# Patient Record
Sex: Female | Born: 1945 | Race: White | Hispanic: No | Marital: Married | Smoking: Former smoker
Health system: Southern US, Community
[De-identification: ages and names within clinical notes are randomized; demographics above are authoritative.]

## PROBLEM LIST (undated history)

## (undated) DIAGNOSIS — D497 Neoplasm of unspecified behavior of endocrine glands and other parts of nervous system: Secondary | ICD-10-CM

## (undated) DIAGNOSIS — J189 Pneumonia, unspecified organism: Secondary | ICD-10-CM

## (undated) DIAGNOSIS — I82409 Acute embolism and thrombosis of unspecified deep veins of unspecified lower extremity: Secondary | ICD-10-CM

## (undated) DIAGNOSIS — R0600 Dyspnea, unspecified: Secondary | ICD-10-CM

## (undated) DIAGNOSIS — M1990 Unspecified osteoarthritis, unspecified site: Secondary | ICD-10-CM

## (undated) DIAGNOSIS — Z9289 Personal history of other medical treatment: Secondary | ICD-10-CM

## (undated) DIAGNOSIS — R0609 Other forms of dyspnea: Secondary | ICD-10-CM

## (undated) DIAGNOSIS — I5189 Other ill-defined heart diseases: Secondary | ICD-10-CM

## (undated) DIAGNOSIS — M545 Low back pain, unspecified: Secondary | ICD-10-CM

## (undated) DIAGNOSIS — G43909 Migraine, unspecified, not intractable, without status migrainosus: Secondary | ICD-10-CM

## (undated) DIAGNOSIS — F419 Anxiety disorder, unspecified: Secondary | ICD-10-CM

## (undated) DIAGNOSIS — Z8489 Family history of other specified conditions: Secondary | ICD-10-CM

## (undated) DIAGNOSIS — G8929 Other chronic pain: Secondary | ICD-10-CM

## (undated) DIAGNOSIS — E039 Hypothyroidism, unspecified: Secondary | ICD-10-CM

## (undated) DIAGNOSIS — I10 Essential (primary) hypertension: Secondary | ICD-10-CM

## (undated) HISTORY — DX: Other forms of dyspnea: R06.09

## (undated) HISTORY — PX: BACK SURGERY: SHX140

## (undated) HISTORY — PX: CARPAL TUNNEL RELEASE: SHX101

## (undated) HISTORY — PX: REFRACTIVE SURGERY: SHX103

## (undated) HISTORY — PX: POSTERIOR CERVICAL FUSION/FORAMINOTOMY: SHX5038

## (undated) HISTORY — PX: HEMORRHOID SURGERY: SHX153

## (undated) HISTORY — PX: DILATION AND CURETTAGE OF UTERUS: SHX78

## (undated) HISTORY — PX: VAGINAL HYSTERECTOMY: SUR661

## (undated) HISTORY — DX: Dyspnea, unspecified: R06.00

## (undated) HISTORY — DX: Other ill-defined heart diseases: I51.89

## (undated) HISTORY — PX: TOTAL THYROIDECTOMY: SHX2547

## (undated) HISTORY — PX: BILATERAL SALPINGOOPHORECTOMY: SHX1223

## (undated) HISTORY — PX: LUMBAR DISC SURGERY: SHX700

---
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
## 1997-09-05 ENCOUNTER — Emergency Department (HOSPITAL_COMMUNITY): Admission: EM | Admit: 1997-09-05 | Discharge: 1997-09-05 | Payer: Self-pay | Admitting: Internal Medicine

## 1998-04-06 ENCOUNTER — Other Ambulatory Visit: Admission: RE | Admit: 1998-04-06 | Discharge: 1998-04-06 | Payer: Self-pay | Admitting: Obstetrics & Gynecology

## 1999-06-23 ENCOUNTER — Encounter: Payer: Self-pay | Admitting: Endocrinology

## 1999-06-23 ENCOUNTER — Ambulatory Visit (HOSPITAL_COMMUNITY): Admission: RE | Admit: 1999-06-23 | Discharge: 1999-06-23 | Payer: Self-pay | Admitting: Endocrinology

## 1999-07-19 ENCOUNTER — Other Ambulatory Visit: Admission: RE | Admit: 1999-07-19 | Discharge: 1999-07-19 | Payer: Self-pay | Admitting: Obstetrics and Gynecology

## 2000-08-16 ENCOUNTER — Encounter: Admission: RE | Admit: 2000-08-16 | Discharge: 2000-08-16 | Payer: Self-pay

## 2000-11-15 ENCOUNTER — Encounter: Admission: RE | Admit: 2000-11-15 | Discharge: 2000-12-28 | Payer: Self-pay | Admitting: Neurology

## 2001-01-22 ENCOUNTER — Encounter: Payer: Self-pay | Admitting: Neurosurgery

## 2001-01-22 ENCOUNTER — Inpatient Hospital Stay (HOSPITAL_COMMUNITY): Admission: RE | Admit: 2001-01-22 | Discharge: 2001-01-23 | Payer: Self-pay | Admitting: Neurosurgery

## 2001-02-13 ENCOUNTER — Encounter: Payer: Self-pay | Admitting: Neurosurgery

## 2001-02-13 ENCOUNTER — Encounter: Admission: RE | Admit: 2001-02-13 | Discharge: 2001-02-13 | Payer: Self-pay | Admitting: Neurosurgery

## 2001-04-03 ENCOUNTER — Encounter: Admission: RE | Admit: 2001-04-03 | Discharge: 2001-04-03 | Payer: Self-pay | Admitting: Neurosurgery

## 2001-04-03 ENCOUNTER — Encounter: Payer: Self-pay | Admitting: Neurosurgery

## 2002-01-29 ENCOUNTER — Other Ambulatory Visit: Admission: RE | Admit: 2002-01-29 | Discharge: 2002-01-29 | Payer: Self-pay | Admitting: Obstetrics and Gynecology

## 2007-01-11 ENCOUNTER — Ambulatory Visit: Payer: Self-pay | Admitting: Gastroenterology

## 2007-07-30 ENCOUNTER — Encounter (INDEPENDENT_AMBULATORY_CARE_PROVIDER_SITE_OTHER): Payer: Self-pay | Admitting: General Surgery

## 2007-07-30 ENCOUNTER — Ambulatory Visit (HOSPITAL_COMMUNITY): Admission: RE | Admit: 2007-07-30 | Discharge: 2007-07-30 | Payer: Self-pay | Admitting: General Surgery

## 2008-03-24 ENCOUNTER — Ambulatory Visit (HOSPITAL_COMMUNITY): Admission: RE | Admit: 2008-03-24 | Discharge: 2008-03-24 | Payer: Self-pay | Admitting: Obstetrics and Gynecology

## 2008-05-16 ENCOUNTER — Encounter: Admission: RE | Admit: 2008-05-16 | Discharge: 2008-05-16 | Payer: Self-pay | Admitting: Endocrinology

## 2010-04-05 ENCOUNTER — Encounter: Admission: RE | Admit: 2010-04-05 | Discharge: 2010-04-05 | Payer: Self-pay | Admitting: Neurosurgery

## 2010-07-14 ENCOUNTER — Encounter: Admission: RE | Admit: 2010-07-14 | Discharge: 2010-07-14 | Payer: Self-pay | Admitting: Endocrinology

## 2010-07-14 ENCOUNTER — Encounter: Admission: RE | Admit: 2010-07-14 | Discharge: 2010-07-14 | Payer: Self-pay | Admitting: Neurosurgery
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
## 2011-01-07 NOTE — H&P (Signed)
[place]  Patient:    Gina Lyons, Gina Lyons                        MRN: [id number] Adm. Date:  01/22/01 Attending:  Hewitt Shorts, M.D. CC:         Bernadene Person, M.D.   History and Physical  HISTORY OF PRESENT ILLNESS:  Patient is a 65 year old right-handed white female who was evaluated for a right cervical radiculopathy.  Symptoms began in the fall of 2001.  She developed pain in the right side of her neck that extended down to her right shoulder, arm, forearm and hand, with the worst of her pain being in the forearm and arm.  She was initially treated for a tendinitis.  She was eventually studied with x-rays and MRI scan.  She describes numbness and tingling in the third, fourth and fifth digits of her right hand and a sense of weakness in the right upper extremity.  She was treated with a variety of NSAIDs without relief; she has been treated with Flexeril as well.  She underwent physical therapy for three weeks which aggravated her pain.  X-rays and MRI scan showed degenerative disk disease and spondylosis at C5-6 with minimal degenerative changes at the other levels.  There is bilateral foraminal encroachment of C5-6, right worse than left.  PAST MEDICAL HISTORY:  Notable for a benign thyroid tumor resected in 1997. She is on Synthroid.  She recently has been started on treatment for hypertension and has been undergoing evaluation for this; there is no history though of myocardial infarction, cancer, stroke, diabetes, peptic ulcer disease or lung disease.  PAST SURGICAL HISTORY:  Previous surgery includes her thyroid surgery in September of 1997 and a hysterectomy in 1970.  ALLERGIES:  She reports allergies to LORCET and CODEINE, both of which cause itching.  CURRENT MEDICATIONS: 1. Synthroid 0.1 mg q.d. 2. Diazepam 10 mg q.p.m. 3. Estradiol 2 mg q.d. 4. Neurontin 300 mg b.i.d. p.r.n. 5. Avalide 12.5 mg recently.  FAMILY  HISTORY:  Her parents have both passed on.  Her mother died at age 43, she had kidney failure.  Her father died at age 58, he had emphysema.  SOCIAL HISTORY:  Patient is married.  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  She does not smoke, drink alcoholic beverages or have a history of substance abuse.  REVIEW OF SYSTEMS:  Review of systems is notable for those difficulties described in her history of present illness and past medical history but is otherwise unremarkable.  PHYSICAL EXAMINATION:  GENERAL:  Patient is a well-developed, well-nourished white female in no acute distress.  Her height is 5 feet 3 inches.  Weight 123 pounds.  VITAL SIGNS:  Temperature 97, pulse 82, blood pressure 121/72, respiratory rate 16.  LUNGS:  Clear to auscultation.  She has symmetrical respiratory excursion.  HEART:  Regular rate and rhythm.  Normal S1 and S2.  There is no murmur.  ABDOMEN:  Soft, nondistended.  Bowel sounds are present.  EXTREMITIES:  Examination shows no clubbing, cyanosis, or edema.  MUSCULOSKELETAL:  No tenderness to palpation over the cervical spinous processes.  There is some tenderness on palpation of the right paracervical musculature, none in the left paracervical musculature.  She has a full range of motion of the neck but discomfort typically with extension of the neck as well as with lateral flexion to either side.  NEUROLOGIC:  Examination shows  5/5 strength to the upper extremities including deltoids, biceps, triceps, extensors and grip.  She has discomfort with use of the right upper extremity and therefore it is difficult for her to exert full effort.  Sensation is somewhat decreased to pinprick in the third, fourth and fifth digits of her right hand, although she does sense pinprick there. Reflexes:  The biceps and brachioradialis are 1 to 2 bilaterally.  Triceps are minimally bilaterally.  Quadriceps are minimal bilaterally.  The gastrocnemii are  minimal bilaterally.  Toes are downgoing bilaterally.  She has a normal gait and stance.  IMPRESSION:  Right cervical radiculopathy secondary to advanced degenerative disk disease and spondylosis at C5-6 with resulting neck pain and radiculopathy.  PLAN:  The patient will be admitted for a single-level C5-6 anterior cervical diskectomy and arthrodesis with allograft and cervical plating.  We have discussed alternatives to surgery, typical length of surgery, hospital stay and overall recuperation, limitations postoperatively and risks of surgery including risks of infection and bleeding with possible need for transfusion, the risk of nerve dysfunction with pain, weakness, numbness or paresthesias, the risk of failure of the arthrodesis and risk of spinal cord dysfunction with paralysis of all four limbs and quadriplegia and anesthetic risks of myocardial infarction, stroke, pneumonia and death.  Understanding all of this, she does wish to proceed with surgery and is admitted for such. DD: 01/22/01 TD:  01/22/01 Job: [id number]

## 2011-01-07 NOTE — Op Note (Signed)
[place]  Patient:    Gina Lyons, Gina Lyons                      MRN: [id number] Proc. Date: 01/22/01 Adm. Date:  [id number] Attending:  Barton Fanny                           Operative Report  PREOPERATIVE DIAGNOSIS:  C5-6 degenerative disk disease, spondylosis, and radiculopathy.  POSTOPERATIVE DIAGNOSIS:  C5-6 degenerative disk disease, spondylosis, and radiculopathy.  PROCEDURE:  C5-6 anterior cervical diskectomy and arthrodesis with iliac crest allograft and tether cervical plating.  SURGEON:  Hewitt Shorts, M.D.  ASSISTANT:  Payton Doughty, M.D.  ANESTHESIA:  General endotracheal.  INDICATIONS:  The patient is a 65 year old woman who presented with a right cervical radiculopathy, who was found to have degenerative disk disease and spondylosis, most significantly effecting the C5-6 level.  It was felt that her radiculopathy was arising from that level.  A decision was made to proceed with a single-level anterior cervical diskectomy and arthrodesis.  DESCRIPTION OF PROCEDURE:  The patient was brought to the operating room and placed under general endotracheal anesthesia.  The patient was placed in 10 pounds of Holter traction, and then the neck was prepped with Betadine soap and solution, and draped in a sterile fashion.  A horizontal incision was made in the left side of the neck.  The line of the incision was infiltrated with local anesthetic with epinephrine.  The incision was made with a soft scalpel at a temperature of 120, and dissection was carried down through the subcutaneous tissue and platysma.  Dissection was then carried out through an avascular plane eliminating the sternocleidomastoid, carotid artery, and jugular vein laterally, and the trachea and esophagus medially.  The ventral aspect of the vertebral column was identified and a localizing x-ray taken. The C5-6 intervertebral disk space was identified.  A  diskectomy was begun with an incision at the annulus and continued with micro-curets and pituitary rongeurs.  The disk space itself was markedly narrowed.  Disk material was spondylitic.  There were ventral osteophytes that were subsequently removed at the time of the arthrodesis.  Dissection was carried down through the disk space, and degenerated disk material was removed.  The microscope was draped and brought into the field to provide sufficient magnification and illumination and visualization.  The remainder of the procedure was performed using microdissection and micro-surgical technique.  Using a Micro-Max drill, spondylitic disk degeneration was removed from the vertebral body end plates, and then spondylitic overgrowth in the posterior aspect of the disk space was removed using the Micro-Max drill and 2 mm Kerrison punches, and then the footplate.  The foraminotomy was performed bilaterally so as to decompress the C6 nerve bilaterally, and then Gelfoam was used to help establish hemostasis. Once hemostasis was established, and a good decompression of the thecal sac and nerve roots was achieved, we proceeded with the arthrodesis.  We selected a wedge of iliac crest allograft.  It was positioned in the intervertebral disk space and countersunk.  The ventral osteophytes were removed, and then we selected a 14 mm tether cervical plate.  It was positioned over the fusion construct, and secured to each of the vertebra with a pair of 4.0 mm x 13.0 mm screws.  Final tightening was done of all four screws, and then an x-ray was taken.  The graft was in  good position.  The plate and screws were in good position.  The overall alignment was good.  The wound was irrigated with bacitracin solution and checked once again.  Hemostasis was established and confirmed, and then we proceeded with the closure of the platysma.  It was closed with interrupted inverted #2-0 undyed Vicryl sutures.  The  subcutaneous and subcuticular were closed with interrupted inverted #3-0 undyed Vicryl sutures.  The skin edges were reapproximated with Dermabond.  The patient tolerated the procedure well.  The estimated blood loss was 75 cc. The sponge all counts were correct.  Following surgery the patient was reversed from the anesthetic, to be extubated, and subsequently transferred to the recovery room for further care.  She is to be placed in a soft cervical collar postoperatively.DD:  01/22/01 TD:  01/22/01 Job: [id number]

## 2011-05-30 LAB — BASIC METABOLIC PANEL
CO2: 28
Creatinine, Ser: 0.58
GFR calc Af Amer: >60
Glucose, Bld: 96
Sodium: 142

## 2011-05-30 LAB — DIFFERENTIAL
Basophils Absolute: 0
Basophils Relative: 0
Eosinophils Relative: 2
Lymphs Abs: 1.5

## 2011-05-30 LAB — CBC
MCV: 95.3
Platelets: 291
RBC: 3.83 — ABNORMAL LOW

## 2011-08-18 ENCOUNTER — Other Ambulatory Visit: Payer: Self-pay | Admitting: Dermatology

## 2011-10-04 ENCOUNTER — Other Ambulatory Visit: Payer: Self-pay | Admitting: Neurosurgery

## 2011-10-04 DIAGNOSIS — M545 Low back pain: Secondary | ICD-10-CM

## 2011-10-08 ENCOUNTER — Ambulatory Visit
Admission: RE | Admit: 2011-10-08 | Discharge: 2011-10-08 | Disposition: A | Discharge status: Home / Self Care | Payer: Medicare Other | Source: Ambulatory Visit | Attending: Neurosurgery | Admitting: Neurosurgery

## 2011-10-08 DIAGNOSIS — M545 Low back pain: Secondary | ICD-10-CM

## 2011-11-30 ENCOUNTER — Other Ambulatory Visit: Payer: Self-pay | Admitting: Dermatology

## 2014-02-06 ENCOUNTER — Other Ambulatory Visit: Payer: Self-pay | Admitting: Gastroenterology

## 2014-02-06 DIAGNOSIS — R197 Diarrhea, unspecified: Secondary | ICD-10-CM

## 2014-02-06 DIAGNOSIS — R1084 Generalized abdominal pain: Secondary | ICD-10-CM

## 2014-02-11 ENCOUNTER — Ambulatory Visit
Admission: RE | Admit: 2014-02-11 | Discharge: 2014-02-11 | Disposition: A | Discharge status: Home / Self Care | Payer: Medicare Other | Source: Ambulatory Visit | Attending: Gastroenterology | Admitting: Gastroenterology

## 2014-02-11 DIAGNOSIS — R1084 Generalized abdominal pain: Secondary | ICD-10-CM

## 2014-02-11 DIAGNOSIS — R197 Diarrhea, unspecified: Secondary | ICD-10-CM

## 2014-02-11 MED ORDER — IOHEXOL 300 MG/ML  SOLN
100.0000 mL | Freq: Once | INTRAMUSCULAR | Status: AC | PRN
  Administered 2014-02-11: 100 mL via INTRAVENOUS

## 2015-03-03 DIAGNOSIS — E039 Hypothyroidism, unspecified: Secondary | ICD-10-CM | POA: Diagnosis not present

## 2015-03-03 DIAGNOSIS — I10 Essential (primary) hypertension: Secondary | ICD-10-CM | POA: Diagnosis not present

## 2015-03-25 DIAGNOSIS — D225 Melanocytic nevi of trunk: Secondary | ICD-10-CM | POA: Diagnosis not present

## 2015-03-25 DIAGNOSIS — L570 Actinic keratosis: Secondary | ICD-10-CM | POA: Diagnosis not present

## 2015-03-25 DIAGNOSIS — L821 Other seborrheic keratosis: Secondary | ICD-10-CM | POA: Diagnosis not present

## 2015-03-25 DIAGNOSIS — L820 Inflamed seborrheic keratosis: Secondary | ICD-10-CM | POA: Diagnosis not present

## 2015-03-25 DIAGNOSIS — X32XXXD Exposure to sunlight, subsequent encounter: Secondary | ICD-10-CM | POA: Diagnosis not present

## 2015-04-29 DIAGNOSIS — Z01411 Encounter for gynecological examination (general) (routine) with abnormal findings: Secondary | ICD-10-CM | POA: Diagnosis not present

## 2015-04-29 DIAGNOSIS — L750 Bromhidrosis: Secondary | ICD-10-CM | POA: Diagnosis not present

## 2015-04-29 DIAGNOSIS — R8290 Unspecified abnormal findings in urine: Secondary | ICD-10-CM | POA: Diagnosis not present

## 2015-04-29 DIAGNOSIS — N959 Unspecified menopausal and perimenopausal disorder: Secondary | ICD-10-CM | POA: Diagnosis not present

## 2015-05-21 DIAGNOSIS — R109 Unspecified abdominal pain: Secondary | ICD-10-CM | POA: Diagnosis not present

## 2015-05-21 DIAGNOSIS — E032 Hypothyroidism due to medicaments and other exogenous substances: Secondary | ICD-10-CM | POA: Diagnosis not present

## 2015-05-21 DIAGNOSIS — M549 Dorsalgia, unspecified: Secondary | ICD-10-CM | POA: Diagnosis not present

## 2015-05-21 DIAGNOSIS — I10 Essential (primary) hypertension: Secondary | ICD-10-CM | POA: Diagnosis not present

## 2015-05-21 DIAGNOSIS — Z1231 Encounter for screening mammogram for malignant neoplasm of breast: Secondary | ICD-10-CM | POA: Diagnosis not present

## 2015-06-10 ENCOUNTER — Other Ambulatory Visit (HOSPITAL_COMMUNITY): Payer: Self-pay

## 2015-06-10 ENCOUNTER — Ambulatory Visit (HOSPITAL_COMMUNITY)
Admission: RE | Admit: 2015-06-10 | Discharge: 2015-06-10 | Disposition: A | Discharge status: Home / Self Care | Payer: Medicare Other | Source: Ambulatory Visit | Attending: Endocrinology | Admitting: Endocrinology

## 2015-06-10 DIAGNOSIS — M7989 Other specified soft tissue disorders: Principal | ICD-10-CM

## 2015-06-10 DIAGNOSIS — M79662 Pain in left lower leg: Secondary | ICD-10-CM

## 2015-06-10 DIAGNOSIS — I82401 Acute embolism and thrombosis of unspecified deep veins of right lower extremity: Secondary | ICD-10-CM | POA: Diagnosis not present

## 2015-06-10 DIAGNOSIS — M79605 Pain in left leg: Secondary | ICD-10-CM | POA: Insufficient documentation

## 2015-06-10 NOTE — Progress Notes (Addendum)
*  Preliminary Results* Left lower extremity venous duplex completed. Left lower extremity is negative for deep vein thrombosis. There is evidence of superficial vein thrombosis involving the greater saphenous vein at the ankle as well as thrombosed varicose vein at the medial ankle. There is no evidence of left Baker's cyst.  Preliminary results discussed with Dr. Maudie Mercury.  06/10/2015 6:00 PM  Maudry Mayhew, RVT, RDCS, RDMS

## 2015-06-11 DIAGNOSIS — Z23 Encounter for immunization: Secondary | ICD-10-CM | POA: Diagnosis not present

## 2015-06-11 DIAGNOSIS — I82409 Acute embolism and thrombosis of unspecified deep veins of unspecified lower extremity: Secondary | ICD-10-CM | POA: Diagnosis not present

## 2015-07-15 ENCOUNTER — Other Ambulatory Visit: Payer: Self-pay | Admitting: Neurosurgery

## 2015-07-15 ENCOUNTER — Ambulatory Visit
Admission: RE | Admit: 2015-07-15 | Discharge: 2015-07-15 | Disposition: A | Discharge status: Home / Self Care | Payer: Medicare Other | Source: Ambulatory Visit | Attending: Neurosurgery | Admitting: Neurosurgery

## 2015-07-15 DIAGNOSIS — M545 Low back pain: Secondary | ICD-10-CM

## 2015-07-15 DIAGNOSIS — M5136 Other intervertebral disc degeneration, lumbar region: Secondary | ICD-10-CM | POA: Diagnosis not present

## 2015-07-21 ENCOUNTER — Other Ambulatory Visit: Payer: Self-pay | Admitting: Neurosurgery

## 2015-07-21 DIAGNOSIS — M545 Low back pain: Secondary | ICD-10-CM

## 2015-07-31 ENCOUNTER — Ambulatory Visit
Admission: RE | Admit: 2015-07-31 | Discharge: 2015-07-31 | Disposition: A | Discharge status: Home / Self Care | Payer: Medicare Other | Source: Ambulatory Visit | Attending: Neurosurgery | Admitting: Neurosurgery

## 2015-07-31 DIAGNOSIS — Z9889 Other specified postprocedural states: Secondary | ICD-10-CM | POA: Diagnosis not present

## 2015-07-31 DIAGNOSIS — M545 Low back pain: Secondary | ICD-10-CM | POA: Diagnosis not present

## 2015-07-31 MED ORDER — GADOBENATE DIMEGLUMINE 529 MG/ML IV SOLN
10.0000 mL | Freq: Once | INTRAVENOUS | Status: AC | PRN
  Administered 2015-07-31: 10 mL via INTRAVENOUS

## 2015-08-25 DIAGNOSIS — M4806 Spinal stenosis, lumbar region: Secondary | ICD-10-CM | POA: Diagnosis not present

## 2015-09-15 DIAGNOSIS — L602 Onychogryphosis: Secondary | ICD-10-CM | POA: Diagnosis not present

## 2015-09-15 DIAGNOSIS — M25774 Osteophyte, right foot: Secondary | ICD-10-CM | POA: Diagnosis not present

## 2015-09-15 DIAGNOSIS — R600 Localized edema: Secondary | ICD-10-CM | POA: Diagnosis not present

## 2015-09-15 DIAGNOSIS — M7742 Metatarsalgia, left foot: Secondary | ICD-10-CM | POA: Diagnosis not present

## 2015-09-16 ENCOUNTER — Other Ambulatory Visit (HOSPITAL_COMMUNITY): Payer: Self-pay | Admitting: Foot & Ankle Surgery

## 2015-09-16 DIAGNOSIS — M79605 Pain in left leg: Secondary | ICD-10-CM

## 2015-09-18 ENCOUNTER — Ambulatory Visit (HOSPITAL_COMMUNITY)
Admission: RE | Admit: 2015-09-18 | Discharge: 2015-09-18 | Disposition: A | Discharge status: Home / Self Care | Payer: Medicare Other | Source: Ambulatory Visit | Attending: Urology | Admitting: Urology

## 2015-09-18 DIAGNOSIS — M79605 Pain in left leg: Secondary | ICD-10-CM | POA: Diagnosis not present

## 2015-09-24 DIAGNOSIS — J329 Chronic sinusitis, unspecified: Secondary | ICD-10-CM | POA: Diagnosis not present

## 2015-11-03 DIAGNOSIS — M25775 Osteophyte, left foot: Secondary | ICD-10-CM | POA: Diagnosis not present

## 2015-11-03 DIAGNOSIS — M25774 Osteophyte, right foot: Secondary | ICD-10-CM | POA: Diagnosis not present

## 2015-11-09 DIAGNOSIS — M2570 Osteophyte, unspecified joint: Secondary | ICD-10-CM | POA: Diagnosis not present

## 2015-11-09 DIAGNOSIS — L600 Ingrowing nail: Secondary | ICD-10-CM | POA: Diagnosis not present

## 2015-11-10 DIAGNOSIS — L570 Actinic keratosis: Secondary | ICD-10-CM | POA: Diagnosis not present

## 2015-11-10 DIAGNOSIS — C44619 Basal cell carcinoma of skin of left upper limb, including shoulder: Secondary | ICD-10-CM | POA: Diagnosis not present

## 2015-11-10 DIAGNOSIS — X32XXXD Exposure to sunlight, subsequent encounter: Secondary | ICD-10-CM | POA: Diagnosis not present

## 2015-11-10 DIAGNOSIS — C44629 Squamous cell carcinoma of skin of left upper limb, including shoulder: Secondary | ICD-10-CM | POA: Diagnosis not present

## 2015-11-10 DIAGNOSIS — I781 Nevus, non-neoplastic: Secondary | ICD-10-CM | POA: Diagnosis not present

## 2015-11-10 DIAGNOSIS — D225 Melanocytic nevi of trunk: Secondary | ICD-10-CM | POA: Diagnosis not present

## 2015-12-09 DIAGNOSIS — L03032 Cellulitis of left toe: Secondary | ICD-10-CM | POA: Diagnosis not present

## 2015-12-24 DIAGNOSIS — L600 Ingrowing nail: Secondary | ICD-10-CM | POA: Diagnosis not present

## 2016-01-15 DIAGNOSIS — M79609 Pain in unspecified limb: Secondary | ICD-10-CM | POA: Diagnosis not present

## 2016-01-15 DIAGNOSIS — B351 Tinea unguium: Secondary | ICD-10-CM | POA: Diagnosis not present

## 2016-02-20 DIAGNOSIS — J189 Pneumonia, unspecified organism: Secondary | ICD-10-CM

## 2016-02-20 HISTORY — DX: Pneumonia, unspecified organism: J18.9

## 2016-03-04 ENCOUNTER — Emergency Department (HOSPITAL_COMMUNITY)
Admission: EM | Admit: 2016-03-04 | Discharge: 2016-03-04 | Disposition: A | Discharge status: Home / Self Care | Payer: Medicare Other | Attending: Emergency Medicine | Admitting: Emergency Medicine

## 2016-03-04 ENCOUNTER — Encounter (HOSPITAL_COMMUNITY): Payer: Self-pay

## 2016-03-04 ENCOUNTER — Emergency Department (HOSPITAL_BASED_OUTPATIENT_CLINIC_OR_DEPARTMENT_OTHER)
Admission: RE | Admit: 2016-03-04 | Discharge: 2016-03-04 | Disposition: A | Discharge status: Home / Self Care | Payer: Medicare Other | Source: Ambulatory Visit | Attending: Emergency Medicine | Admitting: Emergency Medicine

## 2016-03-04 DIAGNOSIS — M79609 Pain in unspecified limb: Secondary | ICD-10-CM

## 2016-03-04 DIAGNOSIS — I82402 Acute embolism and thrombosis of unspecified deep veins of left lower extremity: Secondary | ICD-10-CM | POA: Insufficient documentation

## 2016-03-04 DIAGNOSIS — Z7901 Long term (current) use of anticoagulants: Secondary | ICD-10-CM | POA: Insufficient documentation

## 2016-03-04 DIAGNOSIS — I10 Essential (primary) hypertension: Secondary | ICD-10-CM | POA: Diagnosis not present

## 2016-03-04 HISTORY — DX: Essential (primary) hypertension: I10

## 2016-03-04 HISTORY — DX: Acute embolism and thrombosis of unspecified deep veins of unspecified lower extremity: I82.409

## 2016-03-04 HISTORY — DX: Neoplasm of unspecified behavior of endocrine glands and other parts of nervous system: D49.7

## 2016-03-04 LAB — CBC WITH DIFFERENTIAL/PLATELET
BASOS ABS: 0 10*3/uL (ref 0.0–0.1)
BASOS PCT: 1 %
EOS PCT: 3 %
Eosinophils Absolute: 0.1 10*3/uL (ref 0.0–0.7)
HEMATOCRIT: 38.5 % (ref 36.0–46.0)
Hemoglobin: 12.4 g/dL (ref 12.0–15.0)
Lymphocytes Relative: 29 %
Lymphs Abs: 1.4 10*3/uL (ref 0.7–4.0)
MCH: 31.2 pg (ref 26.0–34.0)
MCHC: 32.2 g/dL (ref 30.0–36.0)
MCV: 96.7 fL (ref 78.0–100.0)
MONO ABS: 0.3 10*3/uL (ref 0.1–1.0)
MONOS PCT: 7 %
Neutro Abs: 2.8 10*3/uL (ref 1.7–7.7)
Neutrophils Relative %: 60 %
PLATELETS: 303 10*3/uL (ref 150–400)
RBC: 3.98 MIL/uL (ref 3.87–5.11)
RDW: 12.4 % (ref 11.5–15.5)
WBC: 4.7 10*3/uL (ref 4.0–10.5)

## 2016-03-04 LAB — I-STAT CHEM 8, ED
BUN: 13 mg/dL (ref 6–20)
CREATININE: 0.8 mg/dL (ref 0.44–1.00)
Calcium, Ion: 1.18 mmol/L (ref 1.12–1.23)
Chloride: 107 mmol/L (ref 101–111)
GLUCOSE: 86 mg/dL (ref 65–99)
HCT: 38 % (ref 36.0–46.0)
HEMOGLOBIN: 12.9 g/dL (ref 12.0–15.0)
Potassium: 4.8 mmol/L (ref 3.5–5.1)
Sodium: 141 mmol/L (ref 135–145)
TCO2: 25 mmol/L (ref 0–100)

## 2016-03-04 MED ORDER — RIVAROXABAN (XARELTO) VTE STARTER PACK (15 & 20 MG): ORAL_TABLET | ORAL | Status: DC

## 2016-03-04 NOTE — Discharge Instructions (Signed)
Deep Vein Thrombosis °A deep vein thrombosis (DVT) is a blood clot (thrombus) that usually occurs in a deep, larger vein of the lower leg or the pelvis, or in an upper extremity such as the arm. These are dangerous and can lead to serious and even life-threatening complications if the clot travels to the lungs. °A DVT can damage the valves in your leg veins so that instead of flowing upward, the blood pools in the lower leg. This is called post-thrombotic syndrome, and it can result in pain, swelling, discoloration, and sores on the leg. °CAUSES °A DVT is caused by the formation of a blood clot in your leg, pelvis, or arm. Usually, several things contribute to the formation of blood clots. A clot may develop when: °· Your blood flow slows down. °· Your vein becomes damaged in some way. °· You have a condition that makes your blood clot more easily. °RISK FACTORS °A DVT is more likely to develop in: °· People who are older, especially over 60 years of age. °· People who are overweight (obese). °· People who sit or lie still for a long time, such as during long-distance travel (over 4 hours), bed rest, hospitalization, or during recovery from certain medical conditions like a stroke. °· People who do not engage in much physical activity (sedentary lifestyle). °· People who have chronic breathing disorders. °· People who have a personal or family history of blood clots or blood clotting disease. °· People who have peripheral vascular disease (PVD), diabetes, or some types of cancer. °· People who have heart disease, especially if the person had a recent heart attack or has congestive heart failure. °· People who have neurological diseases that affect the legs (leg paresis). °· People who have had a traumatic injury, such as breaking a hip or leg. °· People who have recently had major or lengthy surgery, especially on the hip, knee, or abdomen. °· People who have had a central line placed inside a large vein. °· People  who take medicines that contain the hormone estrogen. These include birth control pills and hormone replacement therapy. °· Pregnancy or during childbirth or the postpartum period. °· Long plane flights (over 8 hours). °SIGNS AND SYMPTOMS °Symptoms of a DVT can include:  °· Swelling of your leg or arm, especially if one side is much worse. °· Warmth and redness of your leg or arm, especially if one side is much worse. °· Pain in your arm or leg. If the clot is in your leg, symptoms may be more noticeable or worse when you stand or walk. °· A feeling of pins and needles, if the clot is in the arm. °The symptoms of a DVT that has traveled to the lungs (pulmonary embolism, PE) usually start suddenly and include: °· Shortness of breath while active or at rest. °· Coughing or coughing up blood or blood-tinged mucus. °· Chest pain that is often worse with deep breaths. °· Rapid or irregular heartbeat. °· Feeling light-headed or dizzy. °· Fainting. °· Feeling anxious. °· Sweating. °There may also be pain and swelling in a leg if that is where the blood clot started. °These symptoms may represent a serious problem that is an emergency. Do not wait to see if the symptoms will go away. Get medical help right away. Call your local emergency services (911 in the [place] Do not drive yourself to the hospital. °DIAGNOSIS °Your health care provider will take a medical history and perform a physical exam. You may also   have other tests, including: °· Blood tests to assess the clotting properties of your blood. °· Imaging tests, such as CT, ultrasound, MRI, X-ray, and other tests to see if you have clots anywhere in your body. °TREATMENT °After a DVT is identified, it can be treated. The type of treatment that you receive depends on many factors, such as the cause of your DVT, your risk for bleeding or developing more clots, and other medical conditions that you have. Sometimes, a combination of treatments is necessary. Treatment  options may be combined and include: °· Monitoring the blood clot with ultrasound. °· Taking medicines by mouth, such as newer blood thinners (anticoagulants), thrombolytics, or warfarin. °· Taking anticoagulant medicine by injection or through an IV tube. °· Wearing compression stockings or using different types of devices. °· Surgery (rare) to remove the blood clot or to place a filter in your abdomen to stop the blood clot from traveling to your lungs. °Treatments for a DVT are often divided into immediate treatment and long-term treatment (up to 3 months after DVT). You can work with your health care provider to choose the treatment program that is best for you. °HOME CARE INSTRUCTIONS °If you are taking a newer oral anticoagulant: °· Take the medicine every single day at the same time each day. °· Understand what foods and drugs interact with this medicine. °· Understand that there are no regular blood tests required when using this medicine. °· Understand the side effects of this medicine, including excessive bruising or bleeding. Ask your health care provider or pharmacist about other possible side effects. °If you are taking warfarin: °· Understand how to take warfarin and know which foods can affect how warfarin works in your body. °· Understand that it is dangerous to take too much or too little warfarin. Too much warfarin increases the risk of bleeding. Too little warfarin continues to allow the risk for blood clots. °· Follow your PT and INR blood testing schedule. The PT and INR results allow your health care provider to adjust your dose of warfarin. It is very important that you have your PT and INR tested as often as told by your health care provider. °· Avoid major changes in your diet, or tell your health care provider before you change your diet. Arrange a visit with a registered dietitian to answer your questions. Many foods, especially foods that are high in vitamin K, can interfere with warfarin  and affect the PT and INR results. Eat a consistent amount of foods that are high in vitamin K, such as: °¨ Spinach, kale, broccoli, cabbage, collard greens, turnip greens, Brussels sprouts, peas, cauliflower, seaweed, and parsley. °¨ Beef liver and pork liver. °¨ Green tea. °¨ Soybean oil. °· Tell your health care provider about any and all medicines, vitamins, and supplements that you take, including aspirin and other over-the-counter anti-inflammatory medicines. Be especially cautious with aspirin and anti-inflammatory medicines. Do not take those before you ask your health care provider if it is safe to do so. This is important because many medicines can interfere with warfarin and affect the PT and INR results. °· Do not start or stop taking any over-the-counter or prescription medicine unless your health care provider or pharmacist tells you to do so. °If you take warfarin, you will also need to do these things: °· Hold pressure over cuts for longer than usual. °· Tell your dentist and other health care providers that you are taking warfarin before you have any procedures in which   bleeding may occur. °· Avoid alcohol or drink very small amounts. Tell your health care provider if you change your alcohol intake. °· Do not use tobacco products, including cigarettes, chewing tobacco, and e-cigarettes. If you need help quitting, ask your health care provider. °· Avoid contact sports. °General Instructions °· Take over-the-counter and prescription medicines only as told by your health care provider. Anticoagulant medicines can have side effects, including easy bruising and difficulty stopping bleeding. If you are prescribed an anticoagulant, you will also need to do these things: °¨ Hold pressure over cuts for longer than usual. °¨ Tell your dentist and other health care providers that you are taking anticoagulants before you have any procedures in which bleeding may occur. °¨ Avoid contact sports. °· Wear a medical  alert bracelet or carry a medical alert card that says you have had a PE. °· Ask your health care provider how soon you can go back to your normal activities. Stay active to prevent new blood clots from forming. °· Make sure to exercise while traveling or when you have been sitting or standing for a long period of time. It is very important to exercise. Exercise your legs by walking or by tightening and relaxing your leg muscles often. Take frequent walks. °· Wear compression stockings as told by your health care provider to help prevent more blood clots from forming. °· Do not use tobacco products, including cigarettes, chewing tobacco, and e-cigarettes. If you need help quitting, ask your health care provider. °· Keep all follow-up appointments with your health care provider. This is important. °PREVENTION °Take these actions to decrease your risk of developing another DVT: °· Exercise regularly. For at least 30 minutes every day, engage in: °¨ Activity that involves moving your arms and legs. °¨ Activity that encourages good blood flow through your body by increasing your heart rate. °· Exercise your arms and legs every hour during long-distance travel (over 4 hours). Drink plenty of water and avoid drinking alcohol while traveling. °· Avoid sitting or lying in bed for long periods of time without moving your legs. °· Maintain a weight that is appropriate for your height. Ask your health care provider what weight is healthy for you. °· If you are a woman who is over 35 years of age, avoid unnecessary use of medicines that contain estrogen. These include birth control pills. °· Do not smoke, especially if you take estrogen medicines. If you need help quitting, ask your health care provider. °If you are hospitalized, prevention measures may include: °· Early walking after surgery, as soon as your health care provider says that it is safe. °· Receiving anticoagulants to prevent blood clots. If you cannot take  anticoagulants, other options may be available, such as wearing compression stockings or using different types of devices. °SEEK IMMEDIATE MEDICAL CARE IF: °· You have new or increased pain, swelling, or redness in an arm or leg. °· You have numbness or tingling in an arm or leg. °· You have shortness of breath while active or at rest. °· You have chest pain. °· You have a rapid or irregular heartbeat. °· You feel light-headed or dizzy. °· You cough up blood. °· You notice blood in your vomit, bowel movement, or urine. °These symptoms may represent a serious problem that is an emergency. Do not wait to see if the symptoms will go away. Get medical help right away. Call your local emergency services (911 in the [place] Do not drive yourself to the hospital. °  °  This information is not intended to replace advice given to you by your health care provider. Make sure you discuss any questions you have with your health care provider. °  °Document Released: 08/08/2005 Document Revised: 04/29/2015 Document Reviewed: 12/03/2014 °Elsevier Interactive Patient Education ©2016 [employer] °Rivaroxaban oral tablets °What is this medicine? °RIVAROXABAN (ri va ROX a ban) is an anticoagulant (blood thinner). It is used to treat blood clots in the lungs or in the veins. It is also used after knee or hip surgeries to prevent blood clots. It is also used to lower the chance of stroke in people with a medical condition called atrial fibrillation. °This medicine may be used for other purposes; ask your health care provider or pharmacist if you have questions. °What should I tell my health care provider before I take this medicine? °They need to know if you have any of these conditions: °-bleeding disorders °-bleeding in the brain °-blood in your stools (black or tarry stools) or if you have blood in your vomit °-history of stomach bleeding °-kidney disease °-liver disease °-low blood counts, like low white cell, platelet, or red cell  counts °-recent or planned spinal or epidural procedure °-take medicines that treat or prevent blood clots °-an unusual or allergic reaction to rivaroxaban, other medicines, foods, dyes, or preservatives °-pregnant or trying to get pregnant °-breast-feeding °How should I use this medicine? °Take this medicine by mouth with a glass of water. Follow the directions on the prescription label. Take your medicine at regular intervals. Do not take it more often than directed. Do not stop taking except on your doctor's advice. Stopping this medicine may increase your risk of a blood clot. Be sure to refill your prescription before you run out of medicine. °If you are taking this medicine after hip or knee replacement surgery, take it with or without food. If you are taking this medicine for atrial fibrillation, take it with your evening meal. If you are taking this medicine to treat blood clots, take it with food at the same time each day. If you are unable to swallow your tablet, you may crush the tablet and mix it in applesauce. Then, immediately eat the applesauce. You should eat more food right after you eat the applesauce containing the crushed tablet. °Talk to your pediatrician regarding the use of this medicine in children. Special care may be needed. °Overdosage: If you think you have taken too much of this medicine contact a poison control center or emergency room at once. °NOTE: This medicine is only for you. Do not share this medicine with others. °What if I miss a dose? °If you take your medicine once a day and miss a dose, take the missed dose as soon as you remember. If you take your medicine twice a day and miss a dose, take the missed dose immediately. In this instance, 2 tablets may be taken at the same time. The next day you should take 1 tablet twice a day as directed. °What may interact with this medicine? °-aspirin and aspirin-like medicines °-certain antibiotics like erythromycin, azithromycin, and  clarithromycin °-certain medicines for fungal infections like ketoconazole and itraconazole °-certain medicines for irregular heart beat like amiodarone, quinidine, dronedarone °-certain medicines for seizures like carbamazepine, phenytoin °-certain medicines that treat or prevent blood clots like warfarin, enoxaparin, and dalteparin °-conivaptan °-diltiazem °-felodipine °-indinavir °-lopinavir; ritonavir °-NSAIDS, medicines for pain and inflammation, like ibuprofen or naproxen °-ranolazine °-rifampin °-ritonavir °-St. John's wort °-verapamil °This list may not describe all   possible interactions. Give your health care provider a list of all the medicines, herbs, non-prescription drugs, or dietary supplements you use. Also tell them if you smoke, drink alcohol, or use illegal drugs. Some items may interact with your medicine. °What should I watch for while using this medicine? °Visit your doctor or health care professional for regular checks on your progress. Your condition will be monitored carefully while you are receiving this medicine. °Notify your doctor or health care professional and seek emergency treatment if you develop breathing problems; changes in vision; chest pain; severe, sudden headache; pain, swelling, warmth in the leg; trouble speaking; sudden numbness or weakness of the face, arm, or leg. These can be signs that your condition has gotten worse. °If you are going to have surgery, tell your doctor or health care professional that you are taking this medicine. °Tell your health care professional that you use this medicine before you have a spinal or epidural procedure. Sometimes people who take this medicine have bleeding problems around the spine when they have a spinal or epidural procedure. This bleeding is very rare. If you have a spinal or epidural procedure while on this medicine, call your health care professional immediately if you have back pain, numbness or tingling (especially in your  legs and feet), muscle weakness, paralysis, or loss of bladder or bowel control. °Avoid sports and activities that might cause injury while you are using this medicine. Severe falls or injuries can cause unseen bleeding. Be careful when using sharp tools or knives. Consider using an electric razor. Take special care brushing or flossing your teeth. Report any injuries, bruising, or red spots on the skin to your doctor or health care professional. °What side effects may I notice from receiving this medicine? °Side effects that you should report to your doctor or health care professional as soon as possible: °-allergic reactions like skin rash, itching or hives, swelling of the face, lips, or tongue °-back pain °-redness, blistering, peeling or loosening of the skin, including inside the mouth °-signs and symptoms of bleeding such as bloody or black, tarry stools; red or dark-brown urine; spitting up blood or brown material that looks like coffee grounds; red spots on the skin; unusual bruising or bleeding from the eye, gums, or nose °Side effects that usually do not require medical attention (Report these to your doctor or health care professional if they continue or are bothersome.): °-dizziness °-muscle pain °This list may not describe all possible side effects. Call your doctor for medical advice about side effects. You may report side effects to FDA at 1-800-FDA-1088. °Where should I keep my medicine? °Keep out of the reach of children. °Store at room temperature between 15 and 30 degrees C (59 and 86 degrees F). Throw away any unused medicine after the expiration date. °NOTE: This sheet is a summary. It may not cover all possible information. If you have questions about this medicine, talk to your doctor, pharmacist, or health care provider. °  °© 2016, Elsevier/[employer]. (2014-08-06 12:45:34) ° °

## 2016-03-04 NOTE — ED Notes (Signed)
Pt here for hx of DVT 3 months ago and placed on xarelto. Presents with swelling to left lower leg and pain in bilateral legs.

## 2016-03-04 NOTE — Care Management (Signed)
Patient with DVT plan for dc on NOAG.  Physician talked with pharmacy who has booklet and 30 day free card and assistance card with insurance to give to patient.  Pharmacist plans to give this to patient when she talks to her about medicine.

## 2016-03-04 NOTE — Progress Notes (Signed)
*  PRELIMINARY RESULTS*    Difficult obtaining history of DVT from patient. She seems very confused about timing. Reviewed previous studies in [facility] from showing only superficial thrombus in left GSV (05/2015) and normal study (08/2015). She was sure that she had a DVT 3 months ago, but then she was sure the clot was from October after stating that the test was done at that time. She stated she also took Xarelto for 1 week 3 months ago, but she is unsure why.    Left lower extremity venous duplex has been completed.  Preliminary findings: Appears to be a short segment of DVT in the left peroneal veins.   Called results to Dr. Kathrynn Humble.    Landry Mellow, RDMS, RVT  03/04/2016, 12:25 PM

## 2016-03-04 NOTE — ED Notes (Signed)
Pt transported to vascular.  °

## 2016-03-05 NOTE — ED Provider Notes (Signed)
CSN: [id number]     Arrival date & time 03/04/16  1046 History   First MD Initiated Contact with Patient 03/04/16 1206     Chief Complaint  Patient presents with  . DVT     (Consider location/radiation/quality/duration/timing/severity/associated sxs/prior Treatment) HPI Comments: Pt comes in with cc of LLE pain and swelling. Hx of superficial phlebitis. Pt not on any anticoagulation. Pt has no hx of PE, DVT and denies any exogenous estrogen use, long distance travels or surgery in the past 6 weeks, active cancer, recent immobilization. She denies trauma, falls.   ROS 10 Systems reviewed and are negative for acute change except as noted in the HPI.     The history is provided by the patient.    Past Medical History  Diagnosis Date  . DVT (deep venous thrombosis) ([place])   . Hypertension   . Tumor, thyroid    Past Surgical History  Procedure Laterality Date  . Abdominal hysterectomy    . Thyroid surgery     No family history on file. Social History  Substance Use Topics  . Smoking status: Never Smoker   . Smokeless tobacco: None  . Alcohol Use: No   OB History    No data available     Review of Systems    Allergies  Codeine and Percocet  Home Medications   Prior to Admission medications   Medication Sig Start Date End Date Taking? Authorizing Provider  Rivaroxaban 15 & 20 MG TBPK Take as directed on package: Start with one 15mg  tablet by mouth twice a day with food. On Day 22, switch to one 20mg  tablet once a day with food. 03/04/16   Suleima Ohlendorf, MD   BP 146/98 mmHg  Pulse 68  Temp(Src) 98.5 F (36.9 C) (Oral)  Resp 16  Ht 5\' 2"  (1.575 m)  Wt 120 lb (54.432 kg)  BMI 21.94 kg/m2  SpO2 100% Physical Exam  Constitutional: She is oriented to person, place, and time. She appears well-developed.  HENT:  Head: Normocephalic and atraumatic.  Eyes: EOM are normal.  Neck: Normal range of motion. Neck supple.  Cardiovascular: Normal rate and intact distal  pulses.   Pulmonary/Chest: Effort normal.  Abdominal: Bowel sounds are normal.  Musculoskeletal: She exhibits edema and tenderness.  Calf tenderness  Neurological: She is alert and oriented to person, place, and time.  Skin: Skin is warm and dry.  Nursing note and vitals reviewed.   ED Course  Procedures (including critical care time) Labs Review Labs Reviewed  CBC WITH DIFFERENTIAL/PLATELET  I-STAT CHEM 8, ED    Imaging Review No results found. I have personally reviewed and evaluated these images and lab results as part of my medical decision-making.   EKG Interpretation None      MDM   Final diagnoses:  Acute DVT (deep venous thrombosis), left    Pt has LLE unilateral pain with swelling and acute DVT. We will start her on xarelto.  [place] report is as following:  Left lower extremity venous duplex has been completed. Preliminary findings: Appears to be a short segment of DVT in the left peroneal veins.   Pt is to see her pcp. We will start her on the xarelto starter pack. She was counselled on xarelto by me and the pharmacist.  Varney Biles, MD 03/05/16 1130

## 2016-03-08 ENCOUNTER — Other Ambulatory Visit: Payer: Self-pay | Admitting: Endocrinology

## 2016-03-08 ENCOUNTER — Ambulatory Visit
Admission: RE | Admit: 2016-03-08 | Discharge: 2016-03-08 | Disposition: A | Discharge status: Home / Self Care | Payer: Medicare Other | Source: Ambulatory Visit | Attending: Endocrinology | Admitting: Endocrinology

## 2016-03-08 DIAGNOSIS — R0602 Shortness of breath: Secondary | ICD-10-CM

## 2016-03-08 MED ORDER — IOPAMIDOL (ISOVUE-370) INJECTION 76%
80.0000 mL | Freq: Once | INTRAVENOUS | Status: AC | PRN
  Administered 2016-03-08: 80 mL via INTRAVENOUS

## 2016-03-09 ENCOUNTER — Encounter: Payer: Self-pay | Admitting: Hematology and Oncology

## 2016-03-09 ENCOUNTER — Telehealth: Payer: Self-pay | Admitting: Hematology and Oncology

## 2016-03-09 NOTE — Telephone Encounter (Signed)
Appointment with Gina Lyons on 8/7 at 10:30am. Demographics verified, location given. Letter to referring. Patient agreed to appointment date and time.

## 2016-03-19 ENCOUNTER — Emergency Department (HOSPITAL_COMMUNITY): Payer: Medicare Other

## 2016-03-19 ENCOUNTER — Emergency Department (HOSPITAL_COMMUNITY)
Admission: EM | Admit: 2016-03-19 | Discharge: 2016-03-19 | Disposition: A | Discharge status: Home / Self Care | Payer: Medicare Other | Attending: Emergency Medicine | Admitting: Emergency Medicine

## 2016-03-19 ENCOUNTER — Encounter (HOSPITAL_COMMUNITY): Payer: Self-pay | Admitting: Emergency Medicine

## 2016-03-19 DIAGNOSIS — R0600 Dyspnea, unspecified: Secondary | ICD-10-CM | POA: Diagnosis not present

## 2016-03-19 DIAGNOSIS — Z7901 Long term (current) use of anticoagulants: Secondary | ICD-10-CM | POA: Insufficient documentation

## 2016-03-19 DIAGNOSIS — R0602 Shortness of breath: Secondary | ICD-10-CM | POA: Diagnosis present

## 2016-03-19 DIAGNOSIS — Z79899 Other long term (current) drug therapy: Secondary | ICD-10-CM | POA: Diagnosis not present

## 2016-03-19 DIAGNOSIS — I10 Essential (primary) hypertension: Secondary | ICD-10-CM | POA: Insufficient documentation

## 2016-03-19 DIAGNOSIS — I82402 Acute embolism and thrombosis of unspecified deep veins of left lower extremity: Secondary | ICD-10-CM | POA: Insufficient documentation

## 2016-03-19 LAB — BASIC METABOLIC PANEL
ANION GAP: 8 (ref 5–15)
BUN: 13 mg/dL (ref 6–20)
CO2: 22 mmol/L (ref 22–32)
Calcium: 9.3 mg/dL (ref 8.9–10.3)
Chloride: 109 mmol/L (ref 101–111)
Creatinine, Ser: 0.74 mg/dL (ref 0.44–1.00)
GFR calc Af Amer: >60 mL/min (ref >60)
GFR calc non Af Amer: >60 mL/min (ref >60)
GLUCOSE: 88 mg/dL (ref 65–99)
POTASSIUM: 3.7 mmol/L (ref 3.5–5.1)
Sodium: 139 mmol/L (ref 135–145)

## 2016-03-19 LAB — CBC
HEMATOCRIT: 40.7 % (ref 36.0–46.0)
HEMOGLOBIN: 13.2 g/dL (ref 12.0–15.0)
MCH: 31.1 pg (ref 26.0–34.0)
MCHC: 32.4 g/dL (ref 30.0–36.0)
MCV: 96 fL (ref 78.0–100.0)
Platelets: 327 10*3/uL (ref 150–400)
RBC: 4.24 MIL/uL (ref 3.87–5.11)
RDW: 12.5 % (ref 11.5–15.5)
WBC: 5.1 10*3/uL (ref 4.0–10.5)

## 2016-03-19 LAB — I-STAT TROPONIN, ED: Troponin i, poc: 0 ng/mL (ref 0.00–0.08)

## 2016-03-19 MED ORDER — IOPAMIDOL (ISOVUE-370) INJECTION 76%
INTRAVENOUS | Status: AC
  Administered 2016-03-19: 100 mL
  Filled 2016-03-19: qty 100

## 2016-03-19 NOTE — ED Notes (Signed)
Returns from radiology

## 2016-03-19 NOTE — ED Notes (Signed)
The pt wants to know about her tests she wants the iv out also  Husband at bedside

## 2016-03-19 NOTE — ED Provider Notes (Addendum)
[place] DEPT Provider Note   CSN: [id number] Arrival date & time: 03/19/16  1149  First Provider Contact:  First MD Initiated Contact with Patient 03/19/16 2:10 PM        History   Chief Complaint Chief Complaint  Patient presents with  . Shortness of Breath  . DVT    HPI Gina Lyons is a 70 y.o. female.Complains of shortness of breath worse with exertion over the past 3 weeks, becoming much worse over the past 3 days. She denies cough denies chest pain denies fever no treatment prior to coming here no other associated symptoms. Symptoms worse with exertion and improved with rest she denies noncompliance with medications.  HPI  Past Medical History:  Diagnosis Date  . DVT (deep venous thrombosis) ([place])   . Hypertension   . Tumor, thyroid     There are no active problems to display for this patient.   Past Surgical History:  Procedure Laterality Date  . ABDOMINAL HYSTERECTOMY    . THYROID SURGERY      OB History    No data available       Home Medications    Prior to Admission medications   Medication Sig Start Date End Date Taking? Authorizing Provider  amLODipine (NORVASC) 5 MG tablet Take 5 mg by mouth at bedtime.   Yes Historical Provider, MD  Biotin 5 MG CAPS Take 5 mg by mouth daily.   Yes Historical Provider, MD  diazepam (VALIUM) 10 MG tablet Take 10 mg by mouth daily as needed for anxiety or sleep.   Yes Historical Provider, MD  levothyroxine (SYNTHROID, LEVOTHROID) 112 MCG tablet Take 112 mcg by mouth daily before breakfast. Brand name only**   Yes Historical Provider, MD  naproxen (NAPROSYN) 500 MG tablet Take 500 mg by mouth 2 (two) times daily as needed for mild pain.   Yes Historical Provider, MD  ramipril (ALTACE) 10 MG capsule Take 10 mg by mouth daily.   Yes Historical Provider, MD  Rivaroxaban 15 & 20 MG TBPK Take as directed on package: Start with one 15mg  tablet by mouth twice a day with food. On Day 22, switch to one 20mg  tablet  once a day with food. 03/04/16  Yes Varney Biles, MD    Family History No family history on file.  Social History Social History  Substance Use Topics  . Smoking status: Never Smoker  . Smokeless tobacco: Not on file  . Alcohol use No     Allergies   Celebrex [celecoxib]; Codeine; and Percocet [oxycodone-acetaminophen]   Review of Systems Review of Systems  Constitutional: Negative.   HENT: Negative.   Respiratory: Positive for shortness of breath.   Cardiovascular: Negative.   Gastrointestinal: Negative.   Musculoskeletal: Negative.   Skin: Negative.   Neurological: Negative.   Psychiatric/Behavioral: Negative.   All other systems reviewed and are negative.    Physical Exam Updated Vital Signs BP 151/86   Pulse 69   Temp (!) 96.2 F (35.7 C) (Axillary) Comment: pt cold, breathing very hard, unable to obtain temp Oral  Resp 24   SpO2 100%   Physical Exam  Constitutional: She is oriented to person, place, and time. She appears well-developed and well-nourished. She appears distressed.  Appears mildly dyspneic  HENT:  Head: Normocephalic and atraumatic.  Eyes: Conjunctivae are normal. Pupils are equal, round, and reactive to light.  Neck: Neck supple. No tracheal deviation present. No thyromegaly present.  Cardiovascular: Normal rate and regular rhythm.  No murmur heard. Pulmonary/Chest: Effort normal and breath sounds normal.  Appears mildly dyspneic  Abdominal: Soft. Bowel sounds are normal. She exhibits no distension. There is no tenderness.  Musculoskeletal: Normal range of motion. She exhibits no edema or tenderness.  Neurological: She is alert and oriented to person, place, and time. Coordination normal.  Skin: Skin is warm and dry. No rash noted.  Psychiatric: She has a normal mood and affect.  Nursing note and vitals reviewed.    ED Treatments / Results  Labs (all labs ordered are listed, but only abnormal results are displayed) Labs Reviewed    [place], ED    EKG  EKG Interpretation  Date/Time:  Saturday March 19 2016 11:51:51 EDT Ventricular Rate:  72 PR Interval:  104 QRS Duration: 70 QT Interval:  384 QTC Calculation: 420 R Axis:   71 Text Interpretation:  Sinus rhythm with short PR Nonspecific ST abnormality Abnormal ECG No significant change since last tracing Confirmed by Winfred Leeds  MD, Davidson Palmieri [phone]) on 03/19/2016 2:21:16 PM       Radiology Dg Chest 2 View  Result Date: 03/19/2016 CLINICAL DATA:  Shortness of breath, worsening last 4 days. EXAM: CHEST  2 VIEW COMPARISON:  03/08/2016 FINDINGS: Linear scarring in the lingula. Right lung is clear. Heart is normal size. No confluent airspace opacity or effusion. No acute bony abnormality. Old healed left rib fracture. IMPRESSION: Lingular scarring.  No active disease. Electronically Signed   By: Rolm Baptise M.D.   On: 03/19/2016 13:28 Chest x-ray reviewed by me Results for orders placed or performed during the hospital encounter of [id number]  Basic metabolic panel  Result Value Ref Range   Sodium 139 135 - 145 mmol/L   Potassium 3.7 3.5 - 5.1 mmol/L   Chloride 109 101 - 111 mmol/L   CO2 22 22 - 32 mmol/L   Glucose, Bld 88 65 - 99 mg/dL   BUN 13 6 - 20 mg/dL   Creatinine, Ser 0.74 0.44 - 1.00 mg/dL   Calcium 9.3 8.9 - 10.3 mg/dL   GFR calc non Af Amer >60 >60 mL/min   GFR calc Af Amer >60 >60 mL/min   Anion gap 8 5 - 15  CBC  Result Value Ref Range   WBC 5.1 4.0 - 10.5 K/uL   RBC 4.24 3.87 - 5.11 MIL/uL   Hemoglobin 13.2 12.0 - 15.0 g/dL   HCT 40.7 36.0 - 46.0 %   MCV 96.0 78.0 - 100.0 fL   MCH 31.1 26.0 - 34.0 pg   MCHC 32.4 30.0 - 36.0 g/dL   RDW 12.5 11.5 - 15.5 %   Platelets 327 150 - 400 K/uL  I-stat troponin, ED  Result Value Ref Range   Troponin i, poc 0.00 0.00 - 0.08 ng/mL   Comment 3           Dg Chest 2 View  Result Date: 03/19/2016 CLINICAL DATA:  Shortness of breath, worsening last 4 days. EXAM: CHEST   2 VIEW COMPARISON:  03/08/2016 FINDINGS: Linear scarring in the lingula. Right lung is clear. Heart is normal size. No confluent airspace opacity or effusion. No acute bony abnormality. Old healed left rib fracture. IMPRESSION: Lingular scarring.  No active disease. Electronically Signed   By: Rolm Baptise M.D.   On: 03/19/2016 13:28  Ct Angio Chest Pe W Or Wo Contrast  Result Date: 03/19/2016 CLINICAL DATA:  Chest and back pain with weakness EXAM: CT ANGIOGRAPHY CHEST  WITH CONTRAST TECHNIQUE: Multidetector CT imaging of the chest was performed using the standard protocol during bolus administration of intravenous contrast. Multiplanar CT image reconstructions and MIPs were obtained to evaluate the vascular anatomy. CONTRAST:  100 mL Isovue 370. COMPARISON:  03/08/2016 FINDINGS: Mediastinum/Lymph Nodes: No significant hilar or mediastinal adenopathy is identified. The thoracic inlet is within normal limits. Cardiovascular: There is mild atherosclerotic change of the thoracic aorta. Dilatation to 4.1 cm of the ascending portion is noted. No significant coronary calcifications are seen. Pulmonary artery is well visualized and demonstrates a normal branching pattern. No filling defects to suggest pulmonary embolism are identified. Lungs/Pleura: The lungs are well aerated bilaterally. Stable atelectatic changes are noted in the lingula. Mild persistent infiltrate is noted in the right lower lobe stable from the prior exam. The nodular changes in the right lung base have improved somewhat in size when compared with the prior exam. This is likely related to some improvement in the inflammatory change. No new focal infiltrate is seen. Upper abdomen: No acute findings. Musculoskeletal: No chest wall mass or suspicious bone lesions identified. 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 Review of the MIP images confirms the above findings. IMPRESSION: No evidence of pulmonary emboli. Stable dilatation of the ascending  aorta. Recommend annual imaging followup by CTA or MRA. This recommendation follows 2010 ACCF/AHA/AATS/ACR/ASA/SCA/SCAI/SIR/STS/SVM Guidelines for the Diagnosis and Management of Patients with Thoracic Aortic Disease. Circulation. 2010; 121: [id number] Persistent changes in the lungs bilaterally with some slight improvement in the nodular changes in the right lower lobe consistent with postinflammatory change Electronically Signed   By: Inez Catalina M.D.   On: 03/19/2016 16:22  Ct Angio Chest Pe W Or Wo Contrast  Result Date: 03/08/2016 CLINICAL DATA:  Shortness of breath, new LEFT lower extremity deep vein thrombosis, on Xarelto. Assess for pulmonary embolism. EXAM: CT ANGIOGRAPHY CHEST WITH CONTRAST TECHNIQUE: Multidetector CT imaging of the chest was performed using the standard protocol during bolus administration of intravenous contrast. Multiplanar CT image reconstructions and MIPs were obtained to evaluate the vascular anatomy. CONTRAST:  80 cc Isovue 370 COMPARISON:  Chest radiograph March 02, 2010 FINDINGS: PULMONARY ARTERY: Adequate contrast opacification of the pulmonary artery's. Main pulmonary artery is not enlarged. No pulmonary arterial filling defects to the level of the subsegmental branches. MEDIASTINUM: Heart and pericardium are unremarkable, no right heart strain. Trace coronary artery calcifications. Thoracic aorta is normal course and caliber, mild calcific atherosclerosis. No lymphadenopathy by CT size criteria. LUNGS: Tracheobronchial tree is patent, no pneumothorax. Multiple sub solid RIGHT middle and RIGHT lower lobe pulmonary nodules measuring up to 7 mm, to lesser extent LEFT lower lobe and lingula. Tree-in-bud infiltrates RIGHT lower lobe. No pleural effusion. Bibasilar atelectasis. SOFT TISSUES AND OSSEOUS STRUCTURES: Included view of the abdomen is unremarkable. Visualized soft tissues included osseous structure nonsuspicious, old LEFT rib fractures. ACDF. Approximate T9 hemangioma.  Review of the MIP images confirms the above findings. IMPRESSION: No acute pulmonary embolism. Tree-in-bud infiltrates RIGHT lower lobe may be infectious or inflammatory. Bibasilar sub solid pulmonary nodules measure up to 7 mm. Initial follow-up by chest CT without contrast is recommended in 3 months to confirm persistence. This recommendation follows the consensus statement: Recommendations for the Management of Subsolid Pulmonary Nodules Detected at CT: A Statement from the [place] as published in Radiology 2013; 266:304-317. Electronically Signed   By: Elon Alas M.D.   On: 03/08/2016 13:56   Procedures Procedures (including critical care time)  Medications Ordered in ED Medications  iopamidol (ISOVUE-370) 76 % injection (100 mLs  Contrast Given 03/19/16 1509)     Initial Impression / Assessment and Plan / ED Course  I have reviewed the triage vital signs and the nursing notes.  Pertinent labs & imaging results that were available during my care of the patient were reviewed by me and considered in my medical decision making (see chart for details).  Clinical Course  Comment By Time  Feels unchanged Orlie Dakin, MD 07/29 1600  Patient signed out to Dr. Gareth Eagle, MD 07/29 1610      Final Clinical Impressions(s) / ED Diagnoses  Dx :dyspnea Final diagnoses:  None    New Prescriptions New Prescriptions   No medications on file     Orlie Dakin, MD 03/19/16 [place], MD 03/19/16 1627

## 2016-03-19 NOTE — ED Notes (Signed)
Dr. Jacubowitz at bedside 

## 2016-03-19 NOTE — ED Triage Notes (Signed)
Per pt, found DVT in L leg dx two weeks ago, pt c/o poor energy and SOB since dx. Pt taking xarelto at this time. Pt denies CP, pt very SOB.

## 2016-03-19 NOTE — ED Provider Notes (Signed)
Patient seen and evaluated. Care discussed with Dr. Cathleen Fears. Care assumed at 1600. CTA forcibly shows no acute findings and no clot. Stable AO dilitation with 72yr follow up recommended.  Stable post inflamatory changes.  Was ambulatory here. She does have some dyspnea but no desaturation.  She is not tachycardic. Otherwise symptomatic she does not have pain. EKG does not show injury. The urine or cardiology referral. She sees Dr. Wilson Singer her primary care physician I have asked her to discuss her symptoms with Dr. Wilson Singer  to ensure that he would approve cardiology referral. If she develops pain or worsening symptoms of asked her to recheck here she expresses understanding.   Tanna Furry, MD 03/19/16 1758

## 2016-03-19 NOTE — ED Notes (Signed)
The pt is c/o being cold  Thermostat  Has been incrfeased and warm blankets placed

## 2016-03-28 ENCOUNTER — Ambulatory Visit (HOSPITAL_BASED_OUTPATIENT_CLINIC_OR_DEPARTMENT_OTHER): Payer: Medicare Other | Admitting: Hematology and Oncology

## 2016-03-28 ENCOUNTER — Encounter: Payer: Self-pay | Admitting: Hematology and Oncology

## 2016-03-28 DIAGNOSIS — I712 Thoracic aortic aneurysm, without rupture: Secondary | ICD-10-CM | POA: Diagnosis not present

## 2016-03-28 DIAGNOSIS — I824Z9 Acute embolism and thrombosis of unspecified deep veins of unspecified distal lower extremity: Secondary | ICD-10-CM | POA: Insufficient documentation

## 2016-03-28 DIAGNOSIS — R918 Other nonspecific abnormal finding of lung field: Secondary | ICD-10-CM

## 2016-03-28 DIAGNOSIS — I7121 Aneurysm of the ascending aorta, without rupture: Secondary | ICD-10-CM

## 2016-03-28 DIAGNOSIS — I82452 Acute embolism and thrombosis of left peroneal vein: Secondary | ICD-10-CM

## 2016-03-28 DIAGNOSIS — I82492 Acute embolism and thrombosis of other specified deep vein of left lower extremity: Secondary | ICD-10-CM | POA: Diagnosis not present

## 2016-03-28 NOTE — Assessment & Plan Note (Addendum)
This episode of blood clot appeared to be provoked by prolonged immobility from recent travel and the fact that she is on hormone replacement therapy therapy.  Hormone replacement therapy has been discontinued; There is no need for testing for thrombophilia disorder.  We discussed about various options of anticoagulation therapies including warfarin, low molecular weight heparin such as Lovenox or newer agents such as Rivaroxaban. Some of the risks and benefits discussed including costs involved, the need for monitoring, risks of life-threatening bleeding/hospitalization, reversibility of each agent in the event of bleeding or overdose, safety profile of each drug and taking into account other social issues such as ease of administration of medications, etc. Ultimately, we have made an informed decision for the patient to continue her treatment with Xarelto Duration of treatment is 3 months. She had no complication from her anticoagulation therapy. Continue the same.   Finally, at the end of our consultation today, I reinforced the importance of preventive strategies such as avoiding hormonal supplement, avoiding cigarette smoking, keeping up-to-date with screening programs for early cancer detection, frequent ambulation for long distance travel and aggressive DVT prophylaxis in all surgical settings.  I have not made a return appointment for the patient to come back. I would be happy to assist in perioperative DVT management in the future should she need any interruption of her anticoagulation therapy for elective procedures

## 2016-03-28 NOTE — Assessment & Plan Note (Signed)
She has significant ascending aortic aneurysm. She is currently on medical management with antihypertensives. She has appointment to see cardiologist. I recommend she keeps her appointment in case she has cardiomyopathy that might be causing her shortness of breath

## 2016-03-28 NOTE — Progress Notes (Signed)
[name] CONSULT NOTE  Patient Care Team: Anda Kraft, MD as PCP - General (Endocrinology)  CHIEF COMPLAINTS/PURPOSE OF CONSULTATION:  Recent left lower extremity DVT, severe shortness of breath on minimum exertion  HISTORY OF PRESENTING ILLNESS:  Gina Lyons 70 y.o. female is here because of recent diagnosis of left lower extremity DVT. The patient had hysterectomy in 1970 because of menorrhagia. She has been on hormone replacement therapy for 40 years Recently, she thought she may have swollen left lower extremity around May 2017. I do not have the outside records but she had imaging study done which show superficial thrombophlebitis. She was placed on Xarelto for 1 week. The patient had extensive travel recently. She took a trip to the beach which is approximately 200 miles in distance from 02/18/2016 to 03/03/2016. Around 02/23/2016, she started to complain of profound dizziness and left lower extremity pain. She was seen urgently by her primary care doctor after she returned On 03/04/2016, ultrasound venous Doppler showed left lower extremity DVT affecting a short segment of the left peroneal vein. She was placed on Xarelto but started to complain of extreme dizziness and shortness of breath on minimum exertion. She denies cough, hemoptysis or orthopnea. She had mild deep pleuritic chest discomfort She went to the emergency department and had 2 separate CT scan performed on 03/09/2016 and 03/19/2016 which excluded pulmonary emboli. Blood work show no evidence of anemia and there were no evidence of elevated troponin. She was discharged. She was prescribed a dose of levofloxacin due to possible pneumonia. Even though CT scan did not reveal pulmonary emboli, pulmonary infiltrates were noted, along with evidence of ascending aortic aneurysm In terms of DVT risk factor, apart from recent long distance travel and hormone replacement therapy, she denies recent history of  trauma, dehydration, recent surgery or smoking. She denies further lower extremity swelling, warmth, & erythema but complained of persistent discomfort around the left ankle.  She denies recent palpitation She had no prior history or diagnosis of cancer. Her age appropriate screening programs are up-to-date. She had prior surgeries before and never had perioperative thromboembolic events. The patient had been exposed to birth control pills in the past. The patient had been pregnant before and denies history of peripartum thromboembolic event or history of recurrent miscarriages. There is no family history of blood clots or miscarriages.  MEDICAL HISTORY:  Past Medical History:  Diagnosis Date  . DVT (deep venous thrombosis) ([place])   . Hypertension   . Tumor, thyroid     SURGICAL HISTORY: Past Surgical History:  Procedure Laterality Date  . ABDOMINAL HYSTERECTOMY    . BACK SURGERY    . THYROID SURGERY      SOCIAL HISTORY: Social History   Social History  . Marital status: Married    Spouse name: N/A  . Number of children: 3  . Years of education: N/A   Occupational History  . Not on file.   Social History Main Topics  . Smoking status: Never Smoker  . Smokeless tobacco: Never Used  . Alcohol use No  . Drug use: Unknown  . Sexual activity: Not on file   Other Topics Concern  . Not on file   Social History Narrative   retired    FAMILY HISTORY: Family History  Problem Relation Age of Onset  . Cancer Sister     brain tumor  . Cancer Brother     unknown ca    ALLERGIES:  is allergic to celebrex [  celecoxib]; codeine; and percocet [oxycodone-acetaminophen].  MEDICATIONS:  Current Outpatient Prescriptions  Medication Sig Dispense Refill  . amLODipine (NORVASC) 5 MG tablet Take 5 mg by mouth at bedtime.    . Biotin 5 MG CAPS Take 5 mg by mouth daily.    . diazepam (VALIUM) 10 MG tablet Take 10 mg by mouth daily as needed for anxiety or sleep.    . fexofenadine  (ALLEGRA) 30 MG tablet Take 30 mg by mouth daily.    Marland Kitchen levothyroxine (SYNTHROID, LEVOTHROID) 112 MCG tablet Take 112 mcg by mouth daily before breakfast. Brand name only**    . naproxen (NAPROSYN) 500 MG tablet Take 500 mg by mouth 2 (two) times daily as needed for mild pain.    . ramipril (ALTACE) 10 MG capsule Take 10 mg by mouth daily.    . rivaroxaban (XARELTO) 20 MG TABS tablet Take 20 mg by mouth daily.     No current facility-administered medications for this visit.     REVIEW OF SYSTEMS:   Constitutional: Denies fevers, chills or abnormal night sweats Eyes: Denies blurriness of vision, double vision or watery eyes Ears, nose, mouth, throat, and face: Denies mucositis or sore throat Gastrointestinal:  Denies nausea, heartburn or change in bowel habits Skin: Denies abnormal skin rashes Lymphatics: Denies new lymphadenopathy or easy bruising Neurological:Denies numbness, tingling or new weaknesses Behavioral/Psych: Mood is stable, no new changes  All other systems were reviewed with the patient and are negative.  PHYSICAL EXAMINATION: ECOG PERFORMANCE STATUS: 1 - Symptomatic but completely ambulatory  Vitals:   03/28/16 1056  BP: 113/70  Pulse: 78  Resp: 18  Temp: 97.7 F (36.5 C)   Filed Weights   03/28/16 1056  Weight: 126 lb 4.8 oz (57.3 kg)    GENERAL:alert, but appears to be in moderate distress from shortness of breath SKIN: skin color, texture, turgor are normal, no rashes or significant lesions EYES: normal, conjunctiva are pink and non-injected, sclera clear OROPHARYNX:no exudate, no erythema and lips, buccal mucosa, and tongue normal  NECK: supple, thyroid normal size, non-tender, without nodularity LYMPH:  no palpable lymphadenopathy in the cervical, axillary or inguinal LUNGS: clear to auscultation and percussion with normal breathing effort HEART: regular rate & rhythm and no murmurs and no lower extremity edema ABDOMEN:abdomen soft, non-tender and normal  bowel sounds Musculoskeletal:no cyanosis of digits and no clubbing  PSYCH: alert & oriented x 3 with fluent speech NEURO: no focal motor/sensory deficits  LABORATORY DATA:  I have reviewed the data as listed  RADIOGRAPHIC STUDIES: I have personally reviewed the radiological images as listed and agreed with the findings in the report. Dg Chest 2 View  Result Date: 03/19/2016 CLINICAL DATA:  Shortness of breath, worsening last 4 days. EXAM: CHEST  2 VIEW COMPARISON:  03/08/2016 FINDINGS: Linear scarring in the lingula. Right lung is clear. Heart is normal size. No confluent airspace opacity or effusion. No acute bony abnormality. Old healed left rib fracture. IMPRESSION: Lingular scarring.  No active disease. Electronically Signed   By: Rolm Baptise M.D.   On: 03/19/2016 13:28  Ct Angio Chest Pe W Or Wo Contrast  Result Date: 03/19/2016 CLINICAL DATA:  Chest and back pain with weakness EXAM: CT ANGIOGRAPHY CHEST WITH CONTRAST TECHNIQUE: Multidetector CT imaging of the chest was performed using the standard protocol during bolus administration of intravenous contrast. Multiplanar CT image reconstructions and MIPs were obtained to evaluate the vascular anatomy. CONTRAST:  100 mL Isovue 370. COMPARISON:  03/08/2016 FINDINGS: Mediastinum/Lymph Nodes:  No significant hilar or mediastinal adenopathy is identified. The thoracic inlet is within normal limits. Cardiovascular: There is mild atherosclerotic change of the thoracic aorta. Dilatation to 4.1 cm of the ascending portion is noted. No significant coronary calcifications are seen. Pulmonary artery is well visualized and demonstrates a normal branching pattern. No filling defects to suggest pulmonary embolism are identified. Lungs/Pleura: The lungs are well aerated bilaterally. Stable atelectatic changes are noted in the lingula. Mild persistent infiltrate is noted in the right lower lobe stable from the prior exam. The nodular changes in the right lung  base have improved somewhat in size when compared with the prior exam. This is likely related to some improvement in the inflammatory change. No new focal infiltrate is seen. Upper abdomen: No acute findings. Musculoskeletal: No chest wall mass or suspicious bone lesions identified. 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 Review of the MIP images confirms the above findings. IMPRESSION: No evidence of pulmonary emboli. Stable dilatation of the ascending aorta. Recommend annual imaging followup by CTA or MRA. This recommendation follows 2010 ACCF/AHA/AATS/ACR/ASA/SCA/SCAI/SIR/STS/SVM Guidelines for the Diagnosis and Management of Patients with Thoracic Aortic Disease. Circulation. 2010; 121: [id number] Persistent changes in the lungs bilaterally with some slight improvement in the nodular changes in the right lower lobe consistent with postinflammatory change Electronically Signed   By: Inez Catalina M.D.   On: 03/19/2016 16:22  Ct Angio Chest Pe W Or Wo Contrast  Result Date: 03/08/2016 CLINICAL DATA:  Shortness of breath, new LEFT lower extremity deep vein thrombosis, on Xarelto. Assess for pulmonary embolism. EXAM: CT ANGIOGRAPHY CHEST WITH CONTRAST TECHNIQUE: Multidetector CT imaging of the chest was performed using the standard protocol during bolus administration of intravenous contrast. Multiplanar CT image reconstructions and MIPs were obtained to evaluate the vascular anatomy. CONTRAST:  80 cc Isovue 370 COMPARISON:  Chest radiograph March 02, 2010 FINDINGS: PULMONARY ARTERY: Adequate contrast opacification of the pulmonary artery's. Main pulmonary artery is not enlarged. No pulmonary arterial filling defects to the level of the subsegmental branches. MEDIASTINUM: Heart and pericardium are unremarkable, no right heart strain. Trace coronary artery calcifications. Thoracic aorta is normal course and caliber, mild calcific atherosclerosis. No lymphadenopathy by CT size criteria. LUNGS: Tracheobronchial tree  is patent, no pneumothorax. Multiple sub solid RIGHT middle and RIGHT lower lobe pulmonary nodules measuring up to 7 mm, to lesser extent LEFT lower lobe and lingula. Tree-in-bud infiltrates RIGHT lower lobe. No pleural effusion. Bibasilar atelectasis. SOFT TISSUES AND OSSEOUS STRUCTURES: Included view of the abdomen is unremarkable. Visualized soft tissues included osseous structure nonsuspicious, old LEFT rib fractures. ACDF. Approximate T9 hemangioma. Review of the MIP images confirms the above findings. IMPRESSION: No acute pulmonary embolism. Tree-in-bud infiltrates RIGHT lower lobe may be infectious or inflammatory. Bibasilar sub solid pulmonary nodules measure up to 7 mm. Initial follow-up by chest CT without contrast is recommended in 3 months to confirm persistence. This recommendation follows the consensus statement: Recommendations for the Management of Subsolid Pulmonary Nodules Detected at CT: A Statement from the [place] as published in Radiology 2013; 266:304-317. Electronically Signed   By: Elon Alas M.D.   On: 03/08/2016 13:56    ASSESSMENT & PLAN:  Acute deep vein thrombosis (DVT) of left peroneal vein (HCC) This episode of blood clot appeared to be provoked by prolonged immobility from recent travel and the fact that she is on hormone replacement therapy therapy.  Hormone replacement therapy has been discontinued; There is no need for testing for thrombophilia disorder.  We discussed about various options of anticoagulation therapies including warfarin, low molecular weight heparin such as Lovenox or newer agents such as Rivaroxaban. Some of the risks and benefits discussed including costs involved, the need for monitoring, risks of life-threatening bleeding/hospitalization, reversibility of each agent in the event of bleeding or overdose, safety profile of each drug and taking into account other social issues such as ease of administration of medications, etc.  Ultimately, we have made an informed decision for the patient to continue her treatment with Xarelto Duration of treatment is 3 months. She had no complication from her anticoagulation therapy. Continue the same.   Finally, at the end of our consultation today, I reinforced the importance of preventive strategies such as avoiding hormonal supplement, avoiding cigarette smoking, keeping up-to-date with screening programs for early cancer detection, frequent ambulation for long distance travel and aggressive DVT prophylaxis in all surgical settings.  I have not made a return appointment for the patient to come back. I would be happy to assist in perioperative DVT management in the future should she need any interruption of her anticoagulation therapy for elective procedures  Pulmonary infiltrate The patient have profound shortness of breath on minimal exertion. Thankfully, her vital signs are stable. She had 2 separate spiral CT which excluded pulmonary emboli. I suspect the cause of her profound shortness of breath is related to the pulmonary infiltrates. The cause is unknown. I recommend urgent pulmonary evaluation and she agreed to proceed  Ascending aortic aneurysm [facility]) She has significant ascending aortic aneurysm. She is currently on medical management with antihypertensives. She has appointment to see cardiologist. I recommend she keeps her appointment in case she has cardiomyopathy that might be causing her shortness of breath    Orders Placed This Encounter  Procedures  . Ambulatory referral to Pulmonology    Referral Priority:   Urgent    Referral Type:   Consultation    Referral Reason:   Specialty Services Required    Requested Specialty:   Pulmonary Disease    Number of Visits Requested:   1    All questions were answered. The patient knows to call the clinic with any problems, questions or concerns. I spent 40 minutes counseling the patient face to face. The total time  spent in the appointment was 55 minutes and more than 50% was on counseling.     [facility], Kalum Minner, MD 03/28/2016 5:12 PM

## 2016-03-28 NOTE — Assessment & Plan Note (Signed)
The patient have profound shortness of breath on minimal exertion. Thankfully, her vital signs are stable. She had 2 separate spiral CT which excluded pulmonary emboli. I suspect the cause of her profound shortness of breath is related to the pulmonary infiltrates. The cause is unknown. I recommend urgent pulmonary evaluation and she agreed to proceed

## 2016-03-29 ENCOUNTER — Telehealth: Payer: Self-pay | Admitting: *Deleted

## 2016-03-29 NOTE — Telephone Encounter (Signed)
-----   Message from Heath Lark, MD sent at 03/28/2016  5:13 PM EDT ----- Regarding: urgent pulmonary eval Please help me call [facility] to get her in ASAP for severe SOB, bilateral pulmonary infiltrates

## 2016-03-29 NOTE — Telephone Encounter (Signed)
Referral to [facility] for urgent consult by Dr. Alvy Bimler.   First available appt this Friday 8/11 at 3:30 pm w/ Dr. Lamonte Sakai.   Notified Pt of new appt w/ Dr. Lamonte Sakai date/time and location.  She verbalized understanding.

## 2016-03-30 ENCOUNTER — Encounter (HOSPITAL_COMMUNITY): Payer: Self-pay | Admitting: Nurse Practitioner

## 2016-03-30 ENCOUNTER — Emergency Department (HOSPITAL_COMMUNITY): Payer: Medicare Other

## 2016-03-30 ENCOUNTER — Other Ambulatory Visit: Payer: Self-pay

## 2016-03-30 ENCOUNTER — Inpatient Hospital Stay (HOSPITAL_COMMUNITY)
Admission: EM | Admit: 2016-03-30 | Discharge: 2016-04-01 | DRG: 300 | Disposition: A | Discharge status: Home / Self Care | Payer: Medicare Other | Attending: Internal Medicine | Admitting: Internal Medicine

## 2016-03-30 ENCOUNTER — Encounter: Payer: Self-pay | Admitting: Internal Medicine

## 2016-03-30 ENCOUNTER — Observation Stay (HOSPITAL_COMMUNITY): Payer: Medicare Other

## 2016-03-30 ENCOUNTER — Ambulatory Visit (INDEPENDENT_AMBULATORY_CARE_PROVIDER_SITE_OTHER): Payer: Medicare Other | Admitting: Internal Medicine

## 2016-03-30 VITALS — BP 123/79 | HR 68 | Resp 36 | Ht 62.0 in | Wt 126.4 lb

## 2016-03-30 DIAGNOSIS — E873 Alkalosis: Secondary | ICD-10-CM | POA: Diagnosis not present

## 2016-03-30 DIAGNOSIS — I429 Cardiomyopathy, unspecified: Secondary | ICD-10-CM | POA: Diagnosis present

## 2016-03-30 DIAGNOSIS — I824Z9 Acute embolism and thrombosis of unspecified deep veins of unspecified distal lower extremity: Secondary | ICD-10-CM | POA: Diagnosis present

## 2016-03-30 DIAGNOSIS — R0602 Shortness of breath: Secondary | ICD-10-CM

## 2016-03-30 DIAGNOSIS — I2510 Atherosclerotic heart disease of native coronary artery without angina pectoris: Secondary | ICD-10-CM | POA: Diagnosis present

## 2016-03-30 DIAGNOSIS — I82402 Acute embolism and thrombosis of unspecified deep veins of left lower extremity: Principal | ICD-10-CM | POA: Diagnosis present

## 2016-03-30 DIAGNOSIS — R0600 Dyspnea, unspecified: Secondary | ICD-10-CM | POA: Diagnosis not present

## 2016-03-30 DIAGNOSIS — Z23 Encounter for immunization: Secondary | ICD-10-CM | POA: Diagnosis not present

## 2016-03-30 DIAGNOSIS — I10 Essential (primary) hypertension: Secondary | ICD-10-CM | POA: Diagnosis not present

## 2016-03-30 DIAGNOSIS — I351 Nonrheumatic aortic (valve) insufficiency: Secondary | ICD-10-CM | POA: Diagnosis present

## 2016-03-30 DIAGNOSIS — I824Y9 Acute embolism and thrombosis of unspecified deep veins of unspecified proximal lower extremity: Secondary | ICD-10-CM | POA: Diagnosis not present

## 2016-03-30 DIAGNOSIS — Z87891 Personal history of nicotine dependence: Secondary | ICD-10-CM | POA: Diagnosis not present

## 2016-03-30 DIAGNOSIS — Z7901 Long term (current) use of anticoagulants: Secondary | ICD-10-CM

## 2016-03-30 DIAGNOSIS — R918 Other nonspecific abnormal finding of lung field: Secondary | ICD-10-CM | POA: Diagnosis present

## 2016-03-30 DIAGNOSIS — R0603 Acute respiratory distress: Secondary | ICD-10-CM

## 2016-03-30 DIAGNOSIS — R064 Hyperventilation: Secondary | ICD-10-CM | POA: Diagnosis not present

## 2016-03-30 DIAGNOSIS — R0609 Other forms of dyspnea: Secondary | ICD-10-CM | POA: Diagnosis present

## 2016-03-30 DIAGNOSIS — R0902 Hypoxemia: Secondary | ICD-10-CM | POA: Diagnosis present

## 2016-03-30 DIAGNOSIS — I712 Thoracic aortic aneurysm, without rupture: Secondary | ICD-10-CM | POA: Diagnosis present

## 2016-03-30 DIAGNOSIS — I82409 Acute embolism and thrombosis of unspecified deep veins of unspecified lower extremity: Secondary | ICD-10-CM

## 2016-03-30 DIAGNOSIS — I959 Hypotension, unspecified: Secondary | ICD-10-CM | POA: Diagnosis not present

## 2016-03-30 DIAGNOSIS — R0682 Tachypnea, not elsewhere classified: Secondary | ICD-10-CM | POA: Diagnosis present

## 2016-03-30 HISTORY — DX: Acute embolism and thrombosis of unspecified deep veins of unspecified lower extremity: I82.409

## 2016-03-30 HISTORY — DX: Hypothyroidism, unspecified: E03.9

## 2016-03-30 HISTORY — DX: Anxiety disorder, unspecified: F41.9

## 2016-03-30 HISTORY — DX: Other chronic pain: G89.29

## 2016-03-30 HISTORY — DX: Migraine, unspecified, not intractable, without status migrainosus: G43.909

## 2016-03-30 HISTORY — DX: Personal history of other medical treatment: Z92.89

## 2016-03-30 HISTORY — DX: Low back pain: M54.5

## 2016-03-30 HISTORY — DX: Low back pain, unspecified: M54.50

## 2016-03-30 HISTORY — DX: Unspecified osteoarthritis, unspecified site: M19.90

## 2016-03-30 HISTORY — DX: Family history of other specified conditions: Z84.89

## 2016-03-30 HISTORY — DX: Pneumonia, unspecified organism: J18.9

## 2016-03-30 LAB — TROPONIN I: Troponin I: <0.03 ng/mL (ref <0.03)

## 2016-03-30 LAB — CBC
HEMATOCRIT: 41.1 % (ref 36.0–46.0)
Hemoglobin: 13.3 g/dL (ref 12.0–15.0)
MCH: 31.2 pg (ref 26.0–34.0)
MCHC: 32.4 g/dL (ref 30.0–36.0)
MCV: 96.5 fL (ref 78.0–100.0)
PLATELETS: 326 10*3/uL (ref 150–400)
RBC: 4.26 MIL/uL (ref 3.87–5.11)
RDW: 12.5 % (ref 11.5–15.5)
WBC: 5.5 10*3/uL (ref 4.0–10.5)

## 2016-03-30 LAB — I-STAT ARTERIAL BLOOD GAS, ED
Bicarbonate: 18.5 mEq/L — ABNORMAL LOW (ref 20.0–24.0)
O2 SAT: 98 %
Patient temperature: 97.2
TCO2: 19 mmol/L (ref 0–100)
pCO2 arterial: 17 mmHg — CL (ref 35.0–45.0)
pH, Arterial: 7.643 (ref 7.350–7.450)
pO2, Arterial: 77 mmHg — ABNORMAL LOW (ref 80.0–100.0)

## 2016-03-30 LAB — URINALYSIS, ROUTINE W REFLEX MICROSCOPIC
Bilirubin Urine: NEGATIVE
Glucose, UA: NEGATIVE mg/dL
Hgb urine dipstick: NEGATIVE
Ketones, ur: NEGATIVE mg/dL
Leukocytes, UA: NEGATIVE
Nitrite: NEGATIVE
Protein, ur: NEGATIVE mg/dL
Specific Gravity, Urine: 1.012 (ref 1.005–1.030)
pH: 7.5 (ref 5.0–8.0)

## 2016-03-30 LAB — BASIC METABOLIC PANEL
Anion gap: 7 (ref 5–15)
BUN: 15 mg/dL (ref 6–20)
CO2: 24 mmol/L (ref 22–32)
Calcium: 9.4 mg/dL (ref 8.9–10.3)
Chloride: 108 mmol/L (ref 101–111)
Creatinine, Ser: 0.81 mg/dL (ref 0.44–1.00)
GFR calc Af Amer: >60 mL/min (ref >60)
GFR calc non Af Amer: >60 mL/min (ref >60)
Glucose, Bld: 78 mg/dL (ref 65–99)
Potassium: 4.2 mmol/L (ref 3.5–5.1)
Sodium: 139 mmol/L (ref 135–145)

## 2016-03-30 LAB — BRAIN NATRIURETIC PEPTIDE: B NATRIURETIC PEPTIDE 5: 36 pg/mL (ref 0.0–100.0)

## 2016-03-30 LAB — CBG MONITORING, ED: GLUCOSE-CAPILLARY: 71 mg/dL (ref 65–99)

## 2016-03-30 MED ORDER — LORATADINE 10 MG PO TABS
10.0000 mg | ORAL_TABLET | Freq: Every day | ORAL | Status: DC
  Administered 2016-03-31 – 2016-04-01 (×2): 10 mg via ORAL
  Filled 2016-03-30 (×2): qty 1

## 2016-03-30 MED ORDER — IOPAMIDOL (ISOVUE-370) INJECTION 76%
INTRAVENOUS | Status: AC
  Administered 2016-03-30: 100 mL
  Filled 2016-03-30: qty 100

## 2016-03-30 MED ORDER — RIVAROXABAN 20 MG PO TABS: 20.0000 mg | ORAL_TABLET | Freq: Every day | ORAL | Status: DC

## 2016-03-30 MED ORDER — LEVOFLOXACIN 500 MG PO TABS
500.0000 mg | ORAL_TABLET | Freq: Every day | ORAL | Status: DC
  Administered 2016-03-31 – 2016-04-01 (×2): 500 mg via ORAL
  Filled 2016-03-30 (×3): qty 1

## 2016-03-30 MED ORDER — ZOLPIDEM TARTRATE 5 MG PO TABS
5.0000 mg | ORAL_TABLET | Freq: Once | ORAL | Status: AC
  Administered 2016-03-30: 5 mg via ORAL
  Filled 2016-03-30: qty 1

## 2016-03-30 MED ORDER — FENTANYL CITRATE (PF) 100 MCG/2ML IJ SOLN
25.0000 ug | Freq: Once | INTRAMUSCULAR | Status: AC
  Administered 2016-03-30: 25 ug via INTRAVENOUS
  Filled 2016-03-30: qty 2

## 2016-03-30 MED ORDER — BIOTIN 5 MG PO CAPS: 5.0000 mg | ORAL_CAPSULE | Freq: Every day | ORAL | Status: DC

## 2016-03-30 MED ORDER — PNEUMOCOCCAL VAC POLYVALENT 25 MCG/0.5ML IJ INJ
0.5000 mL | INJECTION | INTRAMUSCULAR | Status: AC
  Administered 2016-03-31: 0.5 mL via INTRAMUSCULAR
  Filled 2016-03-30: qty 0.5

## 2016-03-30 MED ORDER — RAMIPRIL 10 MG PO CAPS
10.0000 mg | ORAL_CAPSULE | Freq: Every day | ORAL | Status: DC
  Administered 2016-03-30 – 2016-03-31 (×2): 10 mg via ORAL
  Filled 2016-03-30 (×2): qty 1

## 2016-03-30 MED ORDER — APIXABAN 5 MG PO TABS
5.0000 mg | ORAL_TABLET | Freq: Two times a day (BID) | ORAL | Status: DC
  Administered 2016-03-30 – 2016-04-01 (×4): 5 mg via ORAL
  Filled 2016-03-30 (×5): qty 1

## 2016-03-30 MED ORDER — ACETAMINOPHEN 650 MG RE SUPP: 650.0000 mg | Freq: Four times a day (QID) | RECTAL | Status: DC | PRN

## 2016-03-30 MED ORDER — DIAZEPAM 5 MG PO TABS
10.0000 mg | ORAL_TABLET | Freq: Every day | ORAL | Status: DC
  Administered 2016-03-30 – 2016-03-31 (×2): 10 mg via ORAL
  Filled 2016-03-30 (×2): qty 2

## 2016-03-30 MED ORDER — ACETAMINOPHEN 325 MG PO TABS: 650.0000 mg | ORAL_TABLET | Freq: Four times a day (QID) | ORAL | Status: DC | PRN

## 2016-03-30 MED ORDER — SODIUM CHLORIDE 0.9 % IV SOLN: 250.0000 mL | INTRAVENOUS | Status: DC | PRN

## 2016-03-30 MED ORDER — SODIUM CHLORIDE 0.9% FLUSH: 3.0000 mL | INTRAVENOUS | Status: DC | PRN

## 2016-03-30 MED ORDER — AMLODIPINE BESYLATE 5 MG PO TABS
5.0000 mg | ORAL_TABLET | Freq: Every day | ORAL | Status: DC
  Administered 2016-03-30 – 2016-03-31 (×2): 5 mg via ORAL
  Filled 2016-03-30 (×2): qty 1

## 2016-03-30 MED ORDER — SODIUM CHLORIDE 0.9% FLUSH
3.0000 mL | Freq: Two times a day (BID) | INTRAVENOUS | Status: DC
  Administered 2016-03-31 – 2016-04-01 (×2): 3 mL via INTRAVENOUS

## 2016-03-30 MED ORDER — LEVOTHYROXINE SODIUM 112 MCG PO TABS
112.0000 ug | ORAL_TABLET | Freq: Every day | ORAL | Status: DC
  Administered 2016-03-31 – 2016-04-01 (×2): 112 ug via ORAL
  Filled 2016-03-30 (×2): qty 1

## 2016-03-30 NOTE — ED Provider Notes (Signed)
[facility] DEPT Provider Note   CSN: [id number] Arrival date & time: 03/30/16  1428  First Provider Contact:  None       History   Chief Complaint Chief Complaint  Patient presents with  . Shortness of Breath  . Weakness    HPI ASMITA DEMARCHI is a 70 y.o. female.  The history is provided by the patient and the spouse. No language interpreter was used.  Shortness of Breath  This is a chronic problem. Duration: about one day. The problem occurs continuously.The current episode started more than 1 week ago. Progression since onset: waxes and wanes. Associated symptoms include leg pain (L) and leg swelling (L). Pertinent negatives include no fever, no headaches, no coryza, no rhinorrhea, no sore throat, no neck pain, no cough, no sputum production, no hemoptysis, no wheezing, no orthopnea, no chest pain, no syncope, no vomiting and no abdominal pain. It is unknown what precipitated the problem. Risk factors: DVT in L lower extremity. Treatments tried: Xarelto. The treatment provided no relief. She has had no prior hospitalizations. She has had prior ED visits. She has had no prior ICU admissions. Associated medical issues include pneumonia and DVT. Associated medical issues do not include asthma, COPD, chronic lung disease, PE, heart failure, past MI or recent surgery.    Past Medical History:  Diagnosis Date  . DVT (deep venous thrombosis) ([place])   . Hypertension   . Tumor, thyroid     Patient Active Problem List   Diagnosis Date Noted  . Hyperventilation 03/30/2016  . Respiratory distress 03/30/2016  . Deep vein thrombosis (DVT) of proximal lower extremity ([place]) 03/28/2016  . Pulmonary infiltrate 03/28/2016  . Ascending aortic aneurysm ([place]) 03/28/2016    Past Surgical History:  Procedure Laterality Date  . ABDOMINAL HYSTERECTOMY    . BACK SURGERY    . lower back surgery    . NECK SURGERY    . THYROID SURGERY      OB History    No data available       Home  Medications    Prior to Admission medications   Medication Sig Start Date End Date Taking? Authorizing Provider  amLODipine (NORVASC) 5 MG tablet Take 5 mg by mouth at bedtime.    Historical Provider, MD  Biotin 5 MG CAPS Take 5 mg by mouth daily.    Historical Provider, MD  diazepam (VALIUM) 10 MG tablet Take 10 mg by mouth daily as needed for anxiety or sleep.    Historical Provider, MD  fexofenadine (ALLEGRA) 30 MG tablet Take 30 mg by mouth daily.    Historical Provider, MD  levothyroxine (SYNTHROID, LEVOTHROID) 112 MCG tablet Take 112 mcg by mouth daily before breakfast. Brand name only**    Historical Provider, MD  naproxen (NAPROSYN) 500 MG tablet Take 500 mg by mouth 2 (two) times daily as needed for mild pain.    Historical Provider, MD  ramipril (ALTACE) 10 MG capsule Take 10 mg by mouth daily.    Historical Provider, MD  rivaroxaban (XARELTO) 20 MG TABS tablet Take 20 mg by mouth daily.    Historical Provider, MD    Family History Family History  Problem Relation Age of Onset  . Cancer Sister     brain tumor  . Cancer Brother     unknown ca    Social History Social History  Substance Use Topics  . Smoking status: Never Smoker  . Smokeless tobacco: Never Used  . Alcohol use No  Allergies   Celebrex [celecoxib]; Codeine; and Percocet [oxycodone-acetaminophen]   Review of Systems Review of Systems  Constitutional: Negative for fever.  HENT: Negative for rhinorrhea and sore throat.   Eyes: Negative.   Respiratory: Positive for shortness of breath. Negative for cough, hemoptysis, sputum production and wheezing.   Cardiovascular: Positive for leg swelling (L). Negative for chest pain, orthopnea and syncope.  Gastrointestinal: Negative for abdominal pain and vomiting.  Genitourinary: Negative.  Negative for hematuria.  Musculoskeletal: Negative for neck pain.  Skin: Negative.   Neurological: Positive for light-headedness. Negative for syncope and headaches.    Psychiatric/Behavioral: Negative for agitation and confusion. The patient is nervous/anxious.      Physical Exam Updated Vital Signs BP 144/87   Pulse 68   Temp 97.2 F (36.2 C) (Oral)   Resp 20   Ht 5\' 2"  (1.575 m)   Wt 54.4 kg   SpO2 100%   BMI 21.95 kg/m   Physical Exam  Constitutional: She is oriented to person, place, and time. She appears well-developed and well-nourished. She appears distressed (mild).  HENT:  Head: Normocephalic and atraumatic.  Eyes: Conjunctivae are normal. Right eye exhibits no discharge. Left eye exhibits no discharge.  Neck: Normal range of motion. Neck supple. No tracheal deviation present.  Cardiovascular: Normal rate, regular rhythm and intact distal pulses.   No murmur heard. Pulmonary/Chest: Breath sounds normal. No stridor. She is in respiratory distress (tachypnea that waxes and wanes when resting). She has no wheezes. She has no rales.  Abdominal: Soft. She exhibits no distension. There is no tenderness. There is no rebound and no guarding.  Musculoskeletal: She exhibits edema (Mild swelling in LLE) and tenderness (LLE). She exhibits no deformity.  Neurological: She is alert and oriented to person, place, and time.  MAEI Paresthesias in hands and feet, perio-oral as well.   Skin: Skin is warm and dry. She is not diaphoretic.  Psychiatric: Judgment and thought content normal.  Anxious  Nursing note and vitals reviewed.    ED Treatments / Results  Labs (all labs ordered are listed, but only abnormal results are displayed) Labs Reviewed  BASIC METABOLIC PANEL  CBC  URINALYSIS, ROUTINE W REFLEX MICROSCOPIC (NOT AT [facility])  BLOOD GAS, ARTERIAL  CBG MONITORING, ED    EKG  EKG Interpretation  Date/Time:  Wednesday March 30 2016 14:35:33 EDT Ventricular Rate:  65 PR Interval:  156 QRS Duration: 66 QT Interval:  422 QTC Calculation: 438 R Axis:   72 Text Interpretation:  Normal sinus rhythm Normal ECG No significant change since  last tracing Confirmed by Glynn Octave [phone]) on 04/01/2016 1:52:41 AM       Radiology No results found. CXR  No acute abnormalities. Scarring at the lung bases. Chronic focal bronchiectasis in the right middle lobe and in the lingula.  CTA  No evidence of pulmonary emboli.  New nonspecific bilateral ground-glass opacities-question edema versus small airway/ small-vessel disease.  Nodular opacities within the lingula and right middle and right lower lobes -likely infectious or inflammatory.  Unchanged ascending thoracic aortic aneurysm measuring 4.1 cm.  Cardiomegaly and coronary artery disease.   Procedures Procedures (including critical care time)  Medications Ordered in ED Medications  fentaNYL (SUBLIMAZE) injection 25 mcg (not administered)   Initial Impression / Assessment and Plan / ED Course  I have reviewed the triage vital signs and the nursing notes.  Pertinent labs & imaging results that were available during my care of the patient were reviewed by me  and considered in my medical decision making (see chart for details).  Clinical Course   70 year old female presents emergency department at the request of her cardiologist because of worsening shortness of breath today.  Patient has a 6 week history of the symptoms.  Has received multiple evaluations including 2 CTAs. Currently on [facility]. Patient had been diagnosed with pneumonia however there is concern the patient may currently have a residual lung parenchyma changes.  Patient is very visibly concerned.  Obtained chest x-ray and basic blood work including ABG which showed a respiratory alkalosis with mild metabolic compensation. Diffuse extremity numbness likely 2/2 low iCal. EKG WNL and trop 0. BNP WNL. Unlikely to be cardiac in nature. Patient was tachypneic on arrival however when reassessing patient, she appears to be in no significant distress. She states her SOB only happens on exertion or when  talking. Patient was not tachycardic or hypoxic and is maintaining her airway patent without assistance.  Due to history of PE and ongoing left lower extremity pain and swelling (which she reports changed today) we will obtain CTA of the chest even though vital signs are reassuring.  Patient has follow-up appointment with pulmonology this Friday.  Patient was given pain medication for left lower extremity discomfort with mild improvement. CTA was unremarkable. Due to amount of discomfort from underlying lung disease, will admit to expedite Pulm evaluation. Discussed results and plan with patient and husband. All questions answered. Patient and VS stable during rest of ED stay.  Final Clinical Impressions(s) / ED Diagnoses   Final diagnoses:  None    New Prescriptions New Prescriptions   No medications on file     Darlina Rumpf, MD 04/02/16 [place], MD 04/06/16 [phone]

## 2016-03-30 NOTE — ED Notes (Signed)
Ambulated Pt. Pt's initial SpO2 was 97% and pulse was 76 bpm. While ambulating, Pt's SpO2 remained at 97%. Pt's pulse was 85 bpm, but increased to110 bpm on the way back to the room. The Pt became very short of breath and weak. Once the Pt was back in the bed, SpO2 was 100% and pulse returned to 70%. RN notified.

## 2016-03-30 NOTE — H&P (Addendum)
TRH H&P   Patient Demographics:    Gina Lyons, is a 70 y.o. female  MRN: [id number]   DOB - October 23, 1945  Admit Date - 03/30/2016  Outpatient Primary MD for the patient is Dwan Bolt, MD  Referring MD/NP/PA:  Winfield Cunas  Outpatient Specialists:  Donalee Citrin  Patient coming from: cardiology office  Chief Complaint  Patient presents with  . Shortness of Breath  . Weakness      HPI:    Gina Lyons  is a 70 y.o. female, w/ DVT apparently presents with complaints of sob for the past 3weeks. This has started since discovering her blood clot.  Denies fever, chills, wt loss, cough, cp, palp. Lying down makes her feel better.  Exertion makes it worse.  Pt was apparently seen by Mali Hilty today and sent to ER for evaluation.  Pox 100% on ra at [facility] office.    In ED,  Pt had CTA chest which was negative for PE ABG showed po2=77 (low), and Pco2 17 (low),  Pt will be admitted for dyspnea.     Review of systems:    In addition to the HPI above, No Fever-chills, No Headache, No changes with Vision or hearing, No problems swallowing food or Liquids, No Chest pain, Cough  No Abdominal pain, No Nausea or Vommitting, Bowel movements are regular, No Blood in stool or Urine, No dysuria, No new skin rashes or bruises, No new joints pains-aches,  No new weakness, tingling, numbness in any extremity, No recent weight gain or loss, No polyuria, polydypsia or polyphagia, No significant Mental Stressors.  A full 10 point Review of Systems was done, except as stated above, all other Review of Systems were negative.   With Past History of the following :    Past Medical History:  Diagnosis Date  . DVT (deep venous thrombosis) ([place])   . Hypertension   . Tumor, thyroid       Past Surgical History:  Procedure Laterality Date  . ABDOMINAL HYSTERECTOMY    . BACK SURGERY     . lower back surgery    . NECK SURGERY    . REFRACTIVE SURGERY    . THYROID SURGERY        Social History:     Social History  Substance Use Topics  . Smoking status: Former Smoker    Packs/day: 1.00    Years: 3.00    Types: Cigarettes    Quit date: 08/23/1975  . Smokeless tobacco: Never Used  . Alcohol use No     Lives - at home  Mobility - walks without assistance     Family History :     Family History  Problem Relation Age of Onset  . Cancer Sister     brain tumor  . Cancer Brother     unknown ca  . Kidney disease Mother   . COPD  Father     smoker      Home Medications:   Prior to Admission medications   Medication Sig Start Date End Date Taking? Authorizing Provider  amLODipine (NORVASC) 5 MG tablet Take 5 mg by mouth at bedtime.   Yes Historical Provider, MD  Biotin 5 MG CAPS Take 5 mg by mouth daily.   Yes Historical Provider, MD  diazepam (VALIUM) 10 MG tablet Take 10 mg by mouth at bedtime.    Yes Historical Provider, MD  fexofenadine (ALLEGRA) 30 MG tablet Take 30 mg by mouth daily.   Yes Historical Provider, MD  levothyroxine (SYNTHROID, LEVOTHROID) 112 MCG tablet Take 112 mcg by mouth daily before breakfast. Brand name only**   Yes Historical Provider, MD  naproxen (NAPROSYN) 500 MG tablet Take 500 mg by mouth 2 (two) times daily as needed for mild pain.   Yes Historical Provider, MD  ramipril (ALTACE) 10 MG capsule Take 10 mg by mouth at bedtime.    Yes Historical Provider, MD  rivaroxaban (XARELTO) 20 MG TABS tablet Take 20 mg by mouth daily with supper.    Yes Historical Provider, MD     Allergies:     Allergies  Allergen Reactions  . Imitrex [Sumatriptan] Other (See Comments)    syncope  . Celebrex [Celecoxib] Other (See Comments)    palpations   . Codeine Itching  . Percocet [Oxycodone-Acetaminophen] Itching     Physical Exam:   Vitals  Blood pressure 140/87, pulse 75, temperature 97.2 F (36.2 C), temperature source Oral,  resp. rate 18, height 5\' 2"  (1.575 m), weight 54.4 kg (120 lb), SpO2 94 %.   1. General  lying in bed in NAD,    2. Normal affect and insight, Not Suicidal or Homicidal, Awake Alert, Oriented X 3.  3. No F.N deficits, ALL C.Nerves Intact, Strength 5/5 all 4 extremities, Sensation intact all 4 extremities, Plantars down going.  4. Ears and Eyes appear Normal, Conjunctivae clear, PERRLA. Moist Oral Mucosa.  5. Supple Neck, No JVD, No cervical lymphadenopathy appriciated, No Carotid Bruits.  6. Symmetrical Chest wall movement, Good air movement bilaterally, CTAB.  7. RRR, No Gallops, Rubs or Murmurs, No Parasternal Heave.  8. Positive Bowel Sounds, Abdomen Soft, No tenderness, No organomegaly appriciated,No rebound -guarding or rigidity.  9.  No Cyanosis, Normal Skin Turgor, No Skin Rash or Bruise.  10. Good muscle tone,  joints appear normal , no effusions, Normal ROM.  11. No Palpable Lymph Nodes in Neck or Axillae     Data Review:    CBC  Recent Labs Lab 03/30/16 1436  WBC 5.5  HGB 13.3  HCT 41.1  PLT 326  MCV 96.5  MCH 31.2  MCHC 32.4  RDW 12.5   ------------------------------------------------------------------------------------------------------------------  Chemistries   Recent Labs Lab 03/30/16 1436  NA 139  K 4.2  CL 108  CO2 24  GLUCOSE 78  BUN 15  CREATININE 0.81  CALCIUM 9.4   ------------------------------------------------------------------------------------------------------------------ estimated creatinine clearance is 49.7 mL/min (by C-G formula based on SCr of 0.81 mg/dL). ------------------------------------------------------------------------------------------------------------------ No results for input(s): TSH, T4TOTAL, T3FREE, THYROIDAB in the last 72 hours.  Invalid input(s): FREET3  Coagulation profile No results for input(s): INR, PROTIME in the last 168  hours. ------------------------------------------------------------------------------------------------------------------- No results for input(s): DDIMER in the last 72 hours. -------------------------------------------------------------------------------------------------------------------  Cardiac Enzymes No results for input(s): CKMB, TROPONINI, MYOGLOBIN in the last 168 hours.  Invalid input(s): CK ------------------------------------------------------------------------------------------------------------------ No results found for: BNP   ---------------------------------------------------------------------------------------------------------------  Urinalysis  Component Value Date/Time   COLORURINE YELLOW 03/30/2016 [place] 03/30/2016 1604   LABSPEC 1.012 03/30/2016 1604   PHURINE 7.5 03/30/2016 1604   GLUCOSEU NEGATIVE 03/30/2016 1604   HGBUR NEGATIVE 03/30/2016 1604   BILIRUBINUR NEGATIVE 03/30/2016 1604   KETONESUR NEGATIVE 03/30/2016 1604   PROTEINUR NEGATIVE 03/30/2016 1604   NITRITE NEGATIVE 03/30/2016 1604   LEUKOCYTESUR NEGATIVE 03/30/2016 1604    ----------------------------------------------------------------------------------------------------------------   Imaging Results:    Dg Chest 2 View  Result Date: 03/30/2016 CLINICAL DATA:  Progressive shortness of breath. EXAM: CHEST  2 VIEW COMPARISON:  Chest x-ray and CT scan dated 03/19/2016 FINDINGS: Heart size and pulmonary vascularity are normal. Tortuosity of the thoracic aorta. Scarring at the lung bases is stable. Old healed left posterior seventh rib fracture. No effusions. IMPRESSION: No acute abnormalities. Scarring at the lung bases. Chronic focal bronchiectasis in the right middle lobe and in the lingula. Electronically Signed   By: Lorriane Shire M.D.   On: 03/30/2016 15:58   Ct Angio Chest Pe W And/or Wo Contrast  Result Date: 03/30/2016 CLINICAL DATA:  70 year old female with  increasing shortness of breath. Patient with known DVT. EXAM: CT ANGIOGRAPHY CHEST WITH CONTRAST TECHNIQUE: Multidetector CT imaging of the chest was performed using the standard protocol during bolus administration of intravenous contrast. Multiplanar CT image reconstructions and MIPs were obtained to evaluate the vascular anatomy. CONTRAST:  100 cc intravenous Isovue 370 COMPARISON:  None. FINDINGS: Cardiovascular: This is a technically satisfactory study. No pulmonary emboli are identified. Ascending thoracic aortic aneurysm measuring 4.1 cm in greatest diameter is unchanged. Cardiomegaly again noted as well as mild coronary artery atherosclerotic calcifications. Mediastinum/Nodes: No mediastinal mass, enlarged lymph nodes or pericardial effusion. Lungs/Pleura: Nonspecific bilateral ground-glass opacities are now noted and may represent edema source small airway/small-vessel disease. Nodular opacities within the lingula, right middle lobe and right lower lobe again noted without significant change. The largest nodule measures 7 mm in the right lower lobe (image 66). No pleural effusion or pneumothorax identified. Upper Abdomen: No acute or significant abnormality. Musculoskeletal: No acute or suspicious abnormalities. Review of the MIP images confirms the above findings. IMPRESSION: No evidence of pulmonary emboli. New nonspecific bilateral ground-glass opacities-question edema versus small airway/ small-vessel disease. Nodular opacities within the lingula and right middle and right lower lobes -likely infectious or inflammatory. Unchanged ascending thoracic aortic aneurysm measuring 4.1 cm. Cardiomegaly and coronary artery disease. Electronically Signed   By: Margarette Canada M.D.   On: 03/30/2016 18:17      Assessment & Plan:    Active Problems:   Tachypnea   Dyspnea    Dyspnea uncertain etiology ? xarelto ? Small PE ? cardiomyopathy Check echo, check bnp Consult pulmonary in am Start on levaquin  500mg  po qday  Hypertension Cont amlodipine, cont ramipril  DVT  Xarelto=> eliquis  DVT Prophylaxis Eliquis  AM Labs Ordered, also please review Full Orders  Family Communication: Admission, patients condition and plan of care including tests being ordered have been discussed with the patient  who indicate understanding and agree with the plan and Code Status.  Code Status FULL CODE  Likely DC to  home  Condition GUARDED    Consults called:  Pulmonary in am  Admission status: observation  Time spent in minutes : 45 minutes   Jani Gravel M.D on 03/30/2016 at 6:20 PM  Between 7am to 7pm - Pager - [phone]. After 7pm go to [email or web address] - password [email or web address]  [facility] - Office  [phone]    

## 2016-03-30 NOTE — Progress Notes (Signed)
ANTICOAGULATION CONSULT NOTE - Initial Consult  Pharmacy Consult for apixaban Indication: DVT  Allergies  Allergen Reactions  . Imitrex [Sumatriptan] Other (See Comments)    syncope  . Celebrex [Celecoxib] Other (See Comments)    palpations   . Codeine Itching  . Percocet [Oxycodone-Acetaminophen] Itching    Patient Measurements: Height: 5\' 2"  (157.5 cm) Weight: 120 lb (54.4 kg) IBW/kg (Calculated) : 50.1  Vital Signs: Temp: 97.2 F (36.2 C) (08/09 1437) Temp Source: Oral (08/09 1437) BP: 160/97 (08/09 1830) Pulse Rate: 71 (08/09 1830)  Labs:  Recent Labs  03/30/16 1436  HGB 13.3  HCT 41.1  PLT 326  CREATININE 0.81    Estimated Creatinine Clearance: 49.7 mL/min (by C-G formula based on SCr of 0.81 mg/dL).   Medical History: Past Medical History:  Diagnosis Date  . DVT (deep venous thrombosis) ([place])   . Hypertension   . Tumor, thyroid     Assessment: 35 yof presented to the ED with SOB. She is on xarelto for a history of DVT but wanting to switch to apixaban. Baseline CBC is WNL. Pt has good renal fxn with Scr 0.81. Last dose of xarelto was yesterday.  Goal of Therapy:  Therapeutic anticoagulation Monitor platelets by anticoagulation protocol: Yes   Plan:  - Apixaban 5mg  PO BID - F/u renal fxn, S&S of bleeding  Gina Lyons, Rande Lawman 03/30/2016,6:53 PM

## 2016-03-30 NOTE — Patient Instructions (Addendum)
Dr. Debara Pickett recommends that you go to [facility] ER immediately for evaluation.

## 2016-03-30 NOTE — ED Notes (Addendum)
Dr. Kim at bedside   

## 2016-03-30 NOTE — ED Notes (Signed)
Admitting at bedside 

## 2016-03-30 NOTE — ED Notes (Signed)
Pt in bathroom with spouse.

## 2016-03-30 NOTE — ED Triage Notes (Signed)
Pt from Dr. Rod Mae office to be evaluated for generalized weakness and progressive shortness of breath over the past 4 weeks. Pt sts was diagnosed with DVT 4 weeks ago and started taking xarelto. Pt endorses intermittent dizziness. Denies chest pain.

## 2016-03-30 NOTE — Progress Notes (Signed)
ABG results given to MD. CO2 was low. Patient was resting seemingly comfortably when I arrived, and began to hyperventilate when I told her I had to draw an ABG. Patient was anxious about the stick due to family members receiving in the past. Seemed to calm down a bit afterwards, breathing returned to normal. MD aware.

## 2016-03-30 NOTE — Progress Notes (Signed)
OFFICE NOTE  Chief Complaint:  Shortness of breath  Primary Care Physician: Dwan Bolt, MD  HPI:  Gina Lyons is a 70 y.o. female who is a new patient today and establishing cardiac care. She was referred for shortness of breath. Over the past 3 weeks she's had progressive significant shortness of breath. She was found to have superficial vein thrombosis after swelling was noted in her leg and ultimately found to have deep vein thrombosis. She was placed on Xarelto. Subsequently she had 2 CT scans of the chest after an x-ray demonstrated some pneumonia. She was treated with antibiotics with resolution of this pneumonia however her CT scan indicated no evidence of pulmonary emboli, stable dilatation of the ascending aorta measuring 4.1 cm, and persistent nodular changes of the lung. She does have a history of thyroid cancer status post surgical removal of the thyroid in 1987. She states there was no metastatic disease at the time. She reports becoming progressively tachypneic with visual changes (blurred vision), perioral numbness and peripheral tingling. She appears to be tiring somewhat with her breathing. Of note she does have some platypnea but minimal improvement in her breathing while laying down.  PMHx:  Past Medical History:  Diagnosis Date  . DVT (deep venous thrombosis) ([place])   . Hypertension   . Tumor, thyroid     Past Surgical History:  Procedure Laterality Date  . ABDOMINAL HYSTERECTOMY    . BACK SURGERY    . THYROID SURGERY      FAMHx:  Family History  Problem Relation Age of Onset  . Cancer Sister     brain tumor  . Cancer Brother     unknown ca    SOCHx:   reports that she has never smoked. She has never used smokeless tobacco. She reports that she does not drink alcohol. Her drug history is not on file.  ALLERGIES:  Allergies  Allergen Reactions  . Celebrex [Celecoxib] Other (See Comments)    palpations   . Codeine Itching  . Percocet  [Oxycodone-Acetaminophen] Itching    ROS: Review of systems not obtained due to patient factors.  HOME MEDS: Current Outpatient Prescriptions  Medication Sig Dispense Refill  . amLODipine (NORVASC) 5 MG tablet Take 5 mg by mouth at bedtime.    . Biotin 5 MG CAPS Take 5 mg by mouth daily.    . diazepam (VALIUM) 10 MG tablet Take 10 mg by mouth daily as needed for anxiety or sleep.    . fexofenadine (ALLEGRA) 30 MG tablet Take 30 mg by mouth daily.    Marland Kitchen levothyroxine (SYNTHROID, LEVOTHROID) 112 MCG tablet Take 112 mcg by mouth daily before breakfast. Brand name only**    . naproxen (NAPROSYN) 500 MG tablet Take 500 mg by mouth 2 (two) times daily as needed for mild pain.    . ramipril (ALTACE) 10 MG capsule Take 10 mg by mouth daily.    . rivaroxaban (XARELTO) 20 MG TABS tablet Take 20 mg by mouth daily.     No current facility-administered medications for this visit.     LABS/IMAGING: No results found for this or any previous visit (from the past 48 hour(s)). No results found.  WEIGHTS: Wt Readings from Last 3 Encounters:  03/30/16 126 lb 6.4 oz (57.3 kg)  03/28/16 126 lb 4.8 oz (57.3 kg)  03/04/16 120 lb (54.4 kg)    VITALS: BP 123/79   Pulse 68   Resp (!) 36   Ht 5\' 2"  (1.575  m)   Wt 126 lb 6.4 oz (57.3 kg)   SpO2 100%   BMI 23.12 kg/m   EXAM: General appearance: fatigued, moderate distress and tachypneic Neck: no carotid bruit and no JVD Lungs: diminished breath sounds bibasilar Heart: regular rate and rhythm Abdomen: soft, non-tender; bowel sounds normal; no masses,  no organomegaly Extremities: extremities normal, atraumatic, no cyanosis or edema Pulses: 2+ and symmetric Skin: Skin color, texture, turgor normal. No rashes or lesions Neurologic: Mental status: Awake, but somewhat distracted, poor memory and concentration Psych: appears anxious, unsettled  EKG: Deferred  ASSESSMENT: 1. Tachypnea with probable CO2 deficit 2. Recent DVT on Xarelto 3. Remote  history of thyroid cancer  PLAN: 1.   Gina Lyons reports 3 weeks of progressively worsening dyspnea and recent DVT for which she was started on Xarelto. She's had 2 CT scans which have failed to show any pulmonary emboli. It could be possible that she has numerous small peripheral emboli that are being missed with the CT scan. VQ scan may be helpful. From a cardiac standpoint, possible etiologies could include heart failure although her oxygen saturation is 100%, some type of cardiac shunting or acute onset valvular heart disease or even pericardial effusion could present in this manner. Other causes could include pulmonary disorders, or perhaps something affecting central respiration in the brain such as recurrent thyroid cancer or pontine mass. Nonetheless, she appears markedly dyspneic, fatigued and tiring from her breathing. Based on these findings, I advised her to go directly to the emergency department. She will likely need an arterial blood gas and possibly BiPAP. Her husband wished to go by private vehicle, although we offered ambulance transfer.  We will notify the emergency department of her pending arrival. Cardiology is available to see her as needed in consultation.  Pixie Casino, MD, [facility] Attending Cardiologist [place] 03/30/2016, 2:11 PM

## 2016-03-30 NOTE — ED Notes (Signed)
When the pt was transferring from the wheelchair to the bed the pt seemed excessively uncomfortable. The pt also became tachypnic and stayed that way for about 1 minute after she was in the bed.

## 2016-03-31 ENCOUNTER — Observation Stay (HOSPITAL_COMMUNITY): Payer: Medicare Other

## 2016-03-31 ENCOUNTER — Observation Stay (HOSPITAL_BASED_OUTPATIENT_CLINIC_OR_DEPARTMENT_OTHER): Payer: Medicare Other

## 2016-03-31 DIAGNOSIS — I429 Cardiomyopathy, unspecified: Secondary | ICD-10-CM | POA: Diagnosis not present

## 2016-03-31 DIAGNOSIS — Z87891 Personal history of nicotine dependence: Secondary | ICD-10-CM | POA: Diagnosis not present

## 2016-03-31 DIAGNOSIS — R0600 Dyspnea, unspecified: Secondary | ICD-10-CM | POA: Diagnosis not present

## 2016-03-31 DIAGNOSIS — R0602 Shortness of breath: Secondary | ICD-10-CM

## 2016-03-31 DIAGNOSIS — I959 Hypotension, unspecified: Secondary | ICD-10-CM | POA: Diagnosis not present

## 2016-03-31 DIAGNOSIS — I2510 Atherosclerotic heart disease of native coronary artery without angina pectoris: Secondary | ICD-10-CM | POA: Diagnosis not present

## 2016-03-31 DIAGNOSIS — I10 Essential (primary) hypertension: Secondary | ICD-10-CM | POA: Diagnosis not present

## 2016-03-31 DIAGNOSIS — E873 Alkalosis: Secondary | ICD-10-CM | POA: Diagnosis not present

## 2016-03-31 DIAGNOSIS — I351 Nonrheumatic aortic (valve) insufficiency: Secondary | ICD-10-CM | POA: Diagnosis not present

## 2016-03-31 DIAGNOSIS — I82402 Acute embolism and thrombosis of unspecified deep veins of left lower extremity: Principal | ICD-10-CM

## 2016-03-31 DIAGNOSIS — Z7901 Long term (current) use of anticoagulants: Secondary | ICD-10-CM | POA: Diagnosis not present

## 2016-03-31 DIAGNOSIS — Z23 Encounter for immunization: Secondary | ICD-10-CM | POA: Diagnosis not present

## 2016-03-31 DIAGNOSIS — R918 Other nonspecific abnormal finding of lung field: Secondary | ICD-10-CM | POA: Diagnosis not present

## 2016-03-31 DIAGNOSIS — I712 Thoracic aortic aneurysm, without rupture: Secondary | ICD-10-CM | POA: Diagnosis not present

## 2016-03-31 DIAGNOSIS — R0902 Hypoxemia: Secondary | ICD-10-CM | POA: Diagnosis not present

## 2016-03-31 LAB — COMPREHENSIVE METABOLIC PANEL
ALT: 15 U/L (ref 14–54)
AST: 21 U/L (ref 15–41)
Albumin: 3.3 g/dL — ABNORMAL LOW (ref 3.5–5.0)
Alkaline Phosphatase: 69 U/L (ref 38–126)
Anion gap: 7 (ref 5–15)
BILIRUBIN TOTAL: 0.4 mg/dL (ref 0.3–1.2)
BUN: 17 mg/dL (ref 6–20)
CHLORIDE: 108 mmol/L (ref 101–111)
CO2: 24 mmol/L (ref 22–32)
CREATININE: 0.79 mg/dL (ref 0.44–1.00)
Calcium: 8.6 mg/dL — ABNORMAL LOW (ref 8.9–10.3)
GFR calc Af Amer: >60 mL/min (ref >60)
Glucose, Bld: 82 mg/dL (ref 65–99)
Potassium: 3.7 mmol/L (ref 3.5–5.1)
Sodium: 139 mmol/L (ref 135–145)
Total Protein: 5.7 g/dL — ABNORMAL LOW (ref 6.5–8.1)

## 2016-03-31 LAB — SEDIMENTATION RATE: Sed Rate: 17 mm/hr (ref 0–22)

## 2016-03-31 LAB — CBC
HCT: 38 % (ref 36.0–46.0)
Hemoglobin: 12.2 g/dL (ref 12.0–15.0)
MCH: 30.8 pg (ref 26.0–34.0)
MCHC: 32.1 g/dL (ref 30.0–36.0)
MCV: 96 fL (ref 78.0–100.0)
PLATELETS: 305 10*3/uL (ref 150–400)
RBC: 3.96 MIL/uL (ref 3.87–5.11)
RDW: 12.5 % (ref 11.5–15.5)
WBC: 5.1 10*3/uL (ref 4.0–10.5)

## 2016-03-31 LAB — ECHOCARDIOGRAM COMPLETE
Height: 62 in
WEIGHTICAEL: 1969.6 [oz_av]

## 2016-03-31 LAB — TROPONIN I
Troponin I: <0.03 ng/mL (ref <0.03)
Troponin I: <0.03 ng/mL (ref <0.03)

## 2016-03-31 MED ORDER — TECHNETIUM TO 99M ALBUMIN AGGREGATED
4.0000 | Freq: Once | INTRAVENOUS | Status: AC | PRN
  Administered 2016-03-31: 4 via INTRAVENOUS

## 2016-03-31 MED ORDER — TECHNETIUM TC 99M DIETHYLENETRIAME-PENTAACETIC ACID: 30.0000 | Freq: Once | INTRAVENOUS | Status: DC | PRN

## 2016-03-31 MED ORDER — ZOLPIDEM TARTRATE 5 MG PO TABS
5.0000 mg | ORAL_TABLET | Freq: Every evening | ORAL | Status: DC | PRN
Start: 2016-03-31 — End: 2016-04-01
  Administered 2016-03-31: 5 mg via ORAL
  Filled 2016-03-31: qty 1

## 2016-03-31 MED ORDER — FUROSEMIDE 10 MG/ML IJ SOLN
20.0000 mg | Freq: Two times a day (BID) | INTRAMUSCULAR | Status: DC
  Administered 2016-03-31 – 2016-04-01 (×2): 20 mg via INTRAVENOUS
  Filled 2016-03-31 (×2): qty 2

## 2016-03-31 NOTE — Progress Notes (Addendum)
Previous Respiratory Virus Panel unable to process. RN to recollect. Will continue to monitor.

## 2016-03-31 NOTE — Progress Notes (Addendum)
R/t Left leg DVT, Dr. Titus Mould suggests no SCDs for pt. Additionally, orders placed for respiratory panel.  Per physician, no ID precautions are necessary for this pt.

## 2016-03-31 NOTE — Progress Notes (Signed)
Patient ID: Gina Lyons, female   DOB: March 16, 1946, 70 y.o.   MRN: [id number]                                                                PROGRESS NOTE                                                                                                                                                                                                             Patient Demographics:    Gina Lyons, is a 70 y.o. female, DOB - 04-21-1946, [id number]  Admit date - 03/30/2016   Admitting Physician Jani Gravel, MD  Outpatient Primary MD for the patient is Dwan Bolt, MD  LOS - 0  Outpatient Specialists: Gareth Eagle  Chief Complaint  Patient presents with  . Shortness of Breath  . Weakness       Brief Narrative  Gina Lyons  is a 70 y.o. female, w/ DVT apparently presents with complaints of sob for the past 3weeks. This has started since discovering her blood clot.  Denies fever, chills, wt loss, cough, cp, palp. Lying down makes her feel better.  Exertion makes it worse.  Pt was apparently seen by Mali Hilty today and sent to ER for evaluation.  Pox 100% on ra at [facility] office.    In ED,  Pt had CTA chest which was negative for PE ABG showed po2=77 (low), and Pco2 17 (low),  Pt will be admitted for dyspnea.     Subjective:    Gina Lyons today has been feeling better.  Pt seen this am.  No headache, No chest pain, No abdominal pain - No Nausea, No new weakness tingling or numbness, No Cough .  Just mild dyspnea.   Assessment  & Plan :    Active Problems:   Deep vein thrombosis (DVT) of proximal lower extremity (HCC)   Pulmonary infiltrate   Tachypnea   Dyspnea   Dyspnea uncertain etiology ? xarelto ? Small PE ? cardiomyopathy Echo EF wnl cont levaquin 500mg  po qday Awaiting VQ results  Hypertension Cont amlodipine, cont ramipril  DVT  Xarelto=> eliquis  DVT Prophylaxis Eliquis    Code Status : FULL CODE  Family Communication  : w  husband  Disposition Plan  : home  Barriers For Discharge :  Consults  :  pulmonary  Procedures  :  CTA chest 8/9=>  No evidence of pulmonary emboli.  New nonspecific bilateral ground-glass opacities-question edema versus small airway/ small-vessel disease.  Nodular opacities within the lingula and right middle and right lower lobes -likely infectious or inflammatory.  Unchanged ascending thoracic aortic aneurysm measuring 4.1 cm.  Cardiomegaly and coronary artery disease.  Cardiac echo 8/10=> EF 50-55%, no pulmonary htn,  Mild AR  VQ scan 8/10=> pending  DVT Prophylaxis  : Eliquis  Lab Results  Component Value Date   PLT 305 03/31/2016    Antibiotics  :    Anti-infectives    Start     Dose/Rate Route Frequency Ordered Stop   03/30/16 1845  levofloxacin (LEVAQUIN) tablet 500 mg     500 mg Oral Daily 03/30/16 1844          Objective:   Vitals:   03/30/16 1908 03/30/16 2124 03/31/16 0528 03/31/16 1225  BP: (!) 158/75 133/77 (!) 102/54 116/76  Pulse: 62 77 71 72  Resp: 18 17 15 16   Temp: 97.6 F (36.4 C) 97.6 F (36.4 C) 98.2 F (36.8 C) 97.4 F (36.3 C)  TempSrc: Oral Oral Oral Oral  SpO2: 100% 97% 98% 95%  Weight: 56 kg (123 lb 8 oz)  55.8 kg (123 lb 1.6 oz)   Height: 5\' 2"  (1.575 m)       Wt Readings from Last 3 Encounters:  03/31/16 55.8 kg (123 lb 1.6 oz)  03/30/16 57.3 kg (126 lb 6.4 oz)  03/28/16 57.3 kg (126 lb 4.8 oz)     Intake/Output Summary (Last 24 hours) at 03/31/16 1748 Last data filed at 03/31/16 1618  Gross per 24 hour  Intake              655 ml  Output             2450 ml  Net            -1795 ml     Physical Exam  Awake Alert, Oriented X 3, No new F.N deficits, Normal affect Gina Lyons.AT,PERRAL Supple Neck,No JVD, No cervical lymphadenopathy appriciated.  Symmetrical Chest wall movement, Good air movement bilaterally, CTAB RRR,No Gallops,Rubs or new Murmurs, No Parasternal Heave +ve B.Sounds, Abd Soft, No tenderness, No  organomegaly appriciated, No rebound - guarding or rigidity. No Cyanosis, Clubbing or edema, No new Rash or bruise      Data Review:    CBC  Recent Labs Lab 03/30/16 1436 03/31/16 0101  WBC 5.5 5.1  HGB 13.3 12.2  HCT 41.1 38.0  PLT 326 305  MCV 96.5 96.0  MCH 31.2 30.8  MCHC 32.4 32.1  RDW 12.5 12.5    Chemistries   Recent Labs Lab 03/30/16 1436 03/31/16 0101  NA 139 139  K 4.2 3.7  CL 108 108  CO2 24 24  GLUCOSE 78 82  BUN 15 17  CREATININE 0.81 0.79  CALCIUM 9.4 8.6*  AST  --  21  ALT  --  15  ALKPHOS  --  69  BILITOT  --  0.4   ------------------------------------------------------------------------------------------------------------------ No results for input(s): CHOL, HDL, LDLCALC, TRIG, CHOLHDL, LDLDIRECT in the last 72 hours.  No results found for: HGBA1C ------------------------------------------------------------------------------------------------------------------ No results for input(s): TSH, T4TOTAL, T3FREE, THYROIDAB in the last 72 hours.  Invalid input(s): FREET3 ------------------------------------------------------------------------------------------------------------------ No results for input(s): VITAMINB12, FOLATE, FERRITIN, TIBC, IRON, RETICCTPCT in the last 72 hours.  Coagulation profile No results for input(s): INR, PROTIME  in the last 168 hours.  No results for input(s): DDIMER in the last 72 hours.  Cardiac Enzymes  Recent Labs Lab 03/30/16 2050 03/31/16 0101 03/31/16 0748  TROPONINI <0.03 <0.03 <0.03   ------------------------------------------------------------------------------------------------------------------    Component Value Date/Time   BNP 36.0 03/30/2016 1920    Inpatient Medications  Scheduled Meds: . amLODipine  5 mg Oral QHS  . apixaban  5 mg Oral BID  . diazepam  10 mg Oral QHS  . furosemide  20 mg Intravenous Q12H  . levofloxacin  500 mg Oral Daily  . levothyroxine  112 mcg Oral QAC breakfast    . loratadine  10 mg Oral Daily  . ramipril  10 mg Oral QHS  . sodium chloride flush  3 mL Intravenous Q12H   Continuous Infusions:  PRN Meds:.sodium chloride, acetaminophen **OR** acetaminophen, sodium chloride flush, technetium TC 38M diethylenetriame-pentaacetic acid  Micro Results No results found for this or any previous visit (from the past 240 hour(s)).  Radiology Reports Dg Chest 2 View  Result Date: 03/30/2016 CLINICAL DATA:  Progressive shortness of breath. EXAM: CHEST  2 VIEW COMPARISON:  Chest x-ray and CT scan dated 03/19/2016 FINDINGS: Heart size and pulmonary vascularity are normal. Tortuosity of the thoracic aorta. Scarring at the lung bases is stable. Old healed left posterior seventh rib fracture. No effusions. IMPRESSION: No acute abnormalities. Scarring at the lung bases. Chronic focal bronchiectasis in the right middle lobe and in the lingula. Electronically Signed   By: Lorriane Shire M.D.   On: 03/30/2016 15:58   Dg Chest 2 View  Result Date: 03/19/2016 CLINICAL DATA:  Shortness of breath, worsening last 4 days. EXAM: CHEST  2 VIEW COMPARISON:  03/08/2016 FINDINGS: Linear scarring in the lingula. Right lung is clear. Heart is normal size. No confluent airspace opacity or effusion. No acute bony abnormality. Old healed left rib fracture. IMPRESSION: Lingular scarring.  No active disease. Electronically Signed   By: Rolm Baptise M.D.   On: 03/19/2016 13:28  Ct Angio Chest Pe W And/or Wo Contrast  Result Date: 03/30/2016 CLINICAL DATA:  70 year old female with increasing shortness of breath. Patient with known DVT. EXAM: CT ANGIOGRAPHY CHEST WITH CONTRAST TECHNIQUE: Multidetector CT imaging of the chest was performed using the standard protocol during bolus administration of intravenous contrast. Multiplanar CT image reconstructions and MIPs were obtained to evaluate the vascular anatomy. CONTRAST:  100 cc intravenous Isovue 370 COMPARISON:  None. FINDINGS: Cardiovascular:  This is a technically satisfactory study. No pulmonary emboli are identified. Ascending thoracic aortic aneurysm measuring 4.1 cm in greatest diameter is unchanged. Cardiomegaly again noted as well as mild coronary artery atherosclerotic calcifications. Mediastinum/Nodes: No mediastinal mass, enlarged lymph nodes or pericardial effusion. Lungs/Pleura: Nonspecific bilateral ground-glass opacities are now noted and may represent edema source small airway/small-vessel disease. Nodular opacities within the lingula, right middle lobe and right lower lobe again noted without significant change. The largest nodule measures 7 mm in the right lower lobe (image 66). No pleural effusion or pneumothorax identified. Upper Abdomen: No acute or significant abnormality. Musculoskeletal: No acute or suspicious abnormalities. Review of the MIP images confirms the above findings. IMPRESSION: No evidence of pulmonary emboli. New nonspecific bilateral ground-glass opacities-question edema versus small airway/ small-vessel disease. Nodular opacities within the lingula and right middle and right lower lobes -likely infectious or inflammatory. Unchanged ascending thoracic aortic aneurysm measuring 4.1 cm. Cardiomegaly and coronary artery disease. Electronically Signed   By: Margarette Canada M.D.   On: 03/30/2016 18:17  Ct Angio Chest Pe W Or Wo Contrast  Result Date: 03/19/2016 CLINICAL DATA:  Chest and back pain with weakness EXAM: CT ANGIOGRAPHY CHEST WITH CONTRAST TECHNIQUE: Multidetector CT imaging of the chest was performed using the standard protocol during bolus administration of intravenous contrast. Multiplanar CT image reconstructions and MIPs were obtained to evaluate the vascular anatomy. CONTRAST:  100 mL Isovue 370. COMPARISON:  03/08/2016 FINDINGS: Mediastinum/Lymph Nodes: No significant hilar or mediastinal adenopathy is identified. The thoracic inlet is within normal limits. Cardiovascular: There is mild atherosclerotic  change of the thoracic aorta. Dilatation to 4.1 cm of the ascending portion is noted. No significant coronary calcifications are seen. Pulmonary artery is well visualized and demonstrates a normal branching pattern. No filling defects to suggest pulmonary embolism are identified. Lungs/Pleura: The lungs are well aerated bilaterally. Stable atelectatic changes are noted in the lingula. Mild persistent infiltrate is noted in the right lower lobe stable from the prior exam. The nodular changes in the right lung base have improved somewhat in size when compared with the prior exam. This is likely related to some improvement in the inflammatory change. No new focal infiltrate is seen. Upper abdomen: No acute findings. Musculoskeletal: No chest wall mass or suspicious bone lesions identified. 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 Review of the MIP images confirms the above findings. IMPRESSION: No evidence of pulmonary emboli. Stable dilatation of the ascending aorta. Recommend annual imaging followup by CTA or MRA. This recommendation follows 2010 ACCF/AHA/AATS/ACR/ASA/SCA/SCAI/SIR/STS/SVM Guidelines for the Diagnosis and Management of Patients with Thoracic Aortic Disease. Circulation. 2010; 121: [id number] Persistent changes in the lungs bilaterally with some slight improvement in the nodular changes in the right lower lobe consistent with postinflammatory change Electronically Signed   By: Inez Catalina M.D.   On: 03/19/2016 16:22  Ct Angio Chest Pe W Or Wo Contrast  Result Date: 03/08/2016 CLINICAL DATA:  Shortness of breath, new LEFT lower extremity deep vein thrombosis, on Xarelto. Assess for pulmonary embolism. EXAM: CT ANGIOGRAPHY CHEST WITH CONTRAST TECHNIQUE: Multidetector CT imaging of the chest was performed using the standard protocol during bolus administration of intravenous contrast. Multiplanar CT image reconstructions and MIPs were obtained to evaluate the vascular anatomy. CONTRAST:  80 cc Isovue  370 COMPARISON:  Chest radiograph March 02, 2010 FINDINGS: PULMONARY ARTERY: Adequate contrast opacification of the pulmonary artery's. Main pulmonary artery is not enlarged. No pulmonary arterial filling defects to the level of the subsegmental branches. MEDIASTINUM: Heart and pericardium are unremarkable, no right heart strain. Trace coronary artery calcifications. Thoracic aorta is normal course and caliber, mild calcific atherosclerosis. No lymphadenopathy by CT size criteria. LUNGS: Tracheobronchial tree is patent, no pneumothorax. Multiple sub solid RIGHT middle and RIGHT lower lobe pulmonary nodules measuring up to 7 mm, to lesser extent LEFT lower lobe and lingula. Tree-in-bud infiltrates RIGHT lower lobe. No pleural effusion. Bibasilar atelectasis. SOFT TISSUES AND OSSEOUS STRUCTURES: Included view of the abdomen is unremarkable. Visualized soft tissues included osseous structure nonsuspicious, old LEFT rib fractures. ACDF. Approximate T9 hemangioma. Review of the MIP images confirms the above findings. IMPRESSION: No acute pulmonary embolism. Tree-in-bud infiltrates RIGHT lower lobe may be infectious or inflammatory. Bibasilar sub solid pulmonary nodules measure up to 7 mm. Initial follow-up by chest CT without contrast is recommended in 3 months to confirm persistence. This recommendation follows the consensus statement: Recommendations for the Management of Subsolid Pulmonary Nodules Detected at CT: A Statement from the [place] as published in Radiology 2013; 266:304-317. Electronically Signed  By: Elon Alas M.D.   On: 03/08/2016 13:56    Time Spent in minutes  30   Jani Gravel M.D on 03/31/2016 at 5:48 PM  Between 7am to 7pm - Pager - [phone]  After 7pm go to [email or web address] - password [facility]  [facility] -  Office  [phone]

## 2016-03-31 NOTE — Consult Note (Signed)
Name: Gina Lyons MRN: [id number] DOB: Aug 01, 1946    ADMISSION DATE:  03/30/2016 CONSULTATION DATE:  8/10  REFERRING MD :  Maudie Mercury ([facility])   CHIEF COMPLAINT:  Dyspnea   BRIEF PATIENT DESCRIPTION: 70yo female former smoker (quit 61yrs ago, only 3 pack year hx) with hx HTN, anxiety, chronic pain, recent dx DVT treated with xarelto (no PE on CT) and recent hx PNA rx as outpt.  She presented 8/9 to outpt cardiology office with 3 week progressive SOB.  In cardiology office she was noted to be significantly SOB, hyperventilating but had O2 sats 100% on RA.  She was sent to ER for further eval.  In ER ABG revealed respiratory alkalosis and hypoxia.  Repeat CTA chest was again neg for PE.  BNP, troponin, wbc all wnl.  She was admitted by [facility] and PCCM consulted 8/10.   SIGNIFICANT EVENTS    STUDIES:  CTA chest 8/9>>> neg PE, New nonspecific bilateral ground-glass opacities-question edema versus small airway/ small-vessel disease.  Nodular opacities within the lingula and right middle and right lower lobes -likely infectious or inflammatory. 2D echo 8/10>>>EF [id number], grade 1 diastolic dysfunction, trivial MR, trivial TR, PA pressure 55mmHg   HISTORY OF PRESENT ILLNESS: 70yo female former smoker (quit 14yrs ago, 1ppd x 3 years) with hx HTN, anxiety, chronic pain, recent dx DVT treated with xarelto (no PE on CT) and recent hx PNA rx as outpt.  She presented 8/9 to outpt cardiology office with 3 week progressive SOB.  In cardiology office she was noted to be significantly SOB, hyperventilating but had O2 sats 100% on RA.  She was sent to ER for further eval.  In ER ABG revealed respiratory alkalosis and hypoxia.  Repeat CTA chest was again neg for PE.  She was admitted by [facility] and PCCM consulted 8/10.   SOB is mostly with exertion.  Did not improve with PNA rx.  Has been ongoing x 3 weeks and she really feels that it started right after her DVT dx.  She read in package insert that xarelto can cause SOB  and she thinks that's what it is.   Denies fevers, chills, cough, purulent sputum, hemoptysis, chest pain, weight loss, orthopnea, n/v/d, dizziness, syncope, recent travel, recent sick contacts.   PAST MEDICAL HISTORY :   has a past medical history of Anxiety; Arthritis; Chronic lower back pain; DVT (deep venous thrombosis) ([place]) (03/30/2016); Family history of adverse reaction to anesthesia; History of blood transfusion (1964); Hypertension; Hypothyroidism; Migraine; Pneumonia (02/2016); and Tumor, thyroid.  has a past surgical history that includes Back surgery; Hemorrhoid surgery; Carpal tunnel release (Right); Lumbar disc surgery (2013?); Posterior cervical fusion/foraminotomy; Vaginal hysterectomy; Bilateral salpingoophorectomy (Bilateral); Dilation and curettage of uterus; Refractive surgery (Bilateral); and Total thyroidectomy (?1974). Prior to Admission medications   Medication Sig Start Date End Date Taking? Authorizing Provider  amLODipine (NORVASC) 5 MG tablet Take 5 mg by mouth at bedtime.   Yes Historical Provider, MD  Biotin 5 MG CAPS Take 5 mg by mouth daily.   Yes Historical Provider, MD  diazepam (VALIUM) 10 MG tablet Take 10 mg by mouth at bedtime.    Yes Historical Provider, MD  fexofenadine (ALLEGRA) 30 MG tablet Take 30 mg by mouth daily.   Yes Historical Provider, MD  levothyroxine (SYNTHROID, LEVOTHROID) 112 MCG tablet Take 112 mcg by mouth daily before breakfast. Brand name only**   Yes Historical Provider, MD  naproxen (NAPROSYN) 500 MG tablet Take 500 mg by mouth 2 (two)  times daily as needed for mild pain.   Yes Historical Provider, MD  ramipril (ALTACE) 10 MG capsule Take 10 mg by mouth at bedtime.    Yes Historical Provider, MD  rivaroxaban (XARELTO) 20 MG TABS tablet Take 20 mg by mouth daily with supper.    Yes Historical Provider, MD   Allergies  Allergen Reactions  . Imitrex [Sumatriptan] Other (See Comments)    syncope  . Celebrex [Celecoxib] Other (See  Comments)    palpations   . Codeine Itching  . Percocet [Oxycodone-Acetaminophen] Itching    FAMILY HISTORY:  family history includes COPD in her father; Cancer in her brother and sister; Kidney disease in her mother. SOCIAL HISTORY:  reports that she quit smoking about 40 years ago. Her smoking use included Cigarettes. She has a 3.00 pack-year smoking history. She has never used smokeless tobacco. She reports that she does not drink alcohol or use drugs.  REVIEW OF SYSTEMS:   As per HPI - All other systems reviewed and were neg.   SUBJECTIVE:   VITAL SIGNS: Temp:  [97.2 F (36.2 C)-98.2 F (36.8 C)] 98.2 F (36.8 C) (08/10 0528) Pulse Rate:  [59-77] 71 (08/10 0528) Resp:  [11-36] 15 (08/10 0528) BP: (102-160)/(54-99) 102/54 (08/10 0528) SpO2:  [93 %-100 %] 98 % (08/10 0528) Weight:  [54.4 kg (120 lb)-57.3 kg (126 lb 6.4 oz)] 55.8 kg (123 lb 1.6 oz) (08/10 0528)  PHYSICAL EXAMINATION: General:  Pleasant, thin female, NAD, resting in bed  Neuro:  Awake, alert, appropriate, MAE  HEENT:  Mm moist, no JVD  Cardiovascular:  s1s2 rrr  Lungs:  resps even, completely comfortable, non labored, not tachypneic at rest, clear throughout  Abdomen:  Round, soft, non tender  Musculoskeletal:  Warm and dry, slight LLE edema and tenderness (DVT)    Recent Labs Lab 03/30/16 1436 03/31/16 0101  NA 139 139  K 4.2 3.7  CL 108 108  CO2 24 24  BUN 15 17  CREATININE 0.81 0.79  GLUCOSE 78 82    Recent Labs Lab 03/30/16 1436 03/31/16 0101  HGB 13.3 12.2  HCT 41.1 38.0  WBC 5.5 5.1  PLT 326 305   Dg Chest 2 View  Result Date: 03/30/2016 CLINICAL DATA:  Progressive shortness of breath. EXAM: CHEST  2 VIEW COMPARISON:  Chest x-ray and CT scan dated 03/19/2016 FINDINGS: Heart size and pulmonary vascularity are normal. Tortuosity of the thoracic aorta. Scarring at the lung bases is stable. Old healed left posterior seventh rib fracture. No effusions. IMPRESSION: No acute abnormalities.  Scarring at the lung bases. Chronic focal bronchiectasis in the right middle lobe and in the lingula. Electronically Signed   By: Lorriane Shire M.D.   On: 03/30/2016 15:58   Ct Angio Chest Pe W And/or Wo Contrast  Result Date: 03/30/2016 CLINICAL DATA:  70 year old female with increasing shortness of breath. Patient with known DVT. EXAM: CT ANGIOGRAPHY CHEST WITH CONTRAST TECHNIQUE: Multidetector CT imaging of the chest was performed using the standard protocol during bolus administration of intravenous contrast. Multiplanar CT image reconstructions and MIPs were obtained to evaluate the vascular anatomy. CONTRAST:  100 cc intravenous Isovue 370 COMPARISON:  None. FINDINGS: Cardiovascular: This is a technically satisfactory study. No pulmonary emboli are identified. Ascending thoracic aortic aneurysm measuring 4.1 cm in greatest diameter is unchanged. Cardiomegaly again noted as well as mild coronary artery atherosclerotic calcifications. Mediastinum/Nodes: No mediastinal mass, enlarged lymph nodes or pericardial effusion. Lungs/Pleura: Nonspecific bilateral ground-glass opacities are now noted and  may represent edema source small airway/small-vessel disease. Nodular opacities within the lingula, right middle lobe and right lower lobe again noted without significant change. The largest nodule measures 7 mm in the right lower lobe (image 66). No pleural effusion or pneumothorax identified. Upper Abdomen: No acute or significant abnormality. Musculoskeletal: No acute or suspicious abnormalities. Review of the MIP images confirms the above findings. IMPRESSION: No evidence of pulmonary emboli. New nonspecific bilateral ground-glass opacities-question edema versus small airway/ small-vessel disease. Nodular opacities within the lingula and right middle and right lower lobes -likely infectious or inflammatory. Unchanged ascending thoracic aortic aneurysm measuring 4.1 cm. Cardiomegaly and coronary artery disease.  Electronically Signed   By: Margarette Canada M.D.   On: 03/30/2016 18:17    ASSESSMENT / PLAN:  Dyspnea - unclear etiology.  Essentially normal echo (grade 1 diastolic dysfunction), normal BNP, no fever, no leukocytosis, smoking hx is insignificant.  Recent rx as outpt for ?PNA which is resolved with no sig improvement in symptoms.  ?mild bronchiectasis RML on CT.  On exam she is completely comfortable, no tachypnea or dyspnea, speaking in full sentences.  She is convinced that this is a side effect of the xarelto and has asked to switch to eliquis.  I have not found any incidences of xarelto causing dyspnea nor can I find this listed side effect in the package insert.  Suspect anxiety playing a large role and possible deconditioning.  Respiratory alkalosis  Hypoxia  DVT without PE - ?unprovoked PE.  Likely needs further w/u to r/o malignancy.   REC-  Check PFT's Ok to change to eliquis   Supplemental O2 if needed  Mobilize as able Pulmonary hygiene     Nickolas Madrid, NP 03/31/2016  11:59 AM Pager: [phone] or [phone]  STAFF NOTE: I, Merrie Roof, MD FACP have personally reviewed patient's available data, including medical history, events of note, physical examination and test results as part of my evaluation. I have discussed with resident/NP and other care providers such as pharmacist, RN and RRT. In addition, I personally evaluated patient and elicited key findings of:  No distress, posterior lung fields dry crackles, that dont clear, jvd wnl, etiology unclear but I think it is related to the int changes on CT chets, th3e question is are they related to PE ( will send auto immune panel) or unrelated or pulm edema, will assess VQ to r/o chronic thromboembolic dz, assess pft, send resp viral panel (does NOT need isolation), repeat dopplers to ensure no extension even on anticoagultion, I do not know of any data on xarelto causing son or int lung changes, I updated pt in full,  will follow   Lavon Paganini. Titus Mould, MD, [place] Pgr: [facility] 03/31/2016 2:42 PM

## 2016-03-31 NOTE — ED Provider Notes (Signed)
I saw and evaluated the patient, reviewed the resident's note and I agree with the findings and plan.   EKG Interpretation None     Patient is a 70 year old female presenting with several weeks history of increasing shortness of breath. Patient has had multiple workups for this in the recent weeks/  She is diagnosed as having DVT in her left lower extremity. Patient had follow-up with cardiology for shortness of breath. She's had 2 negative CTA scans or pulmonary embolism.On xarlto already for DVT. Patient continues to be tachypnic with movement. Patient sent from cardiologist here today for further workup. She has  outpatient pulmonology oappointent Friday. Patient expressing interest in admission in order to get further workup at this time. Patient does have tachypnea on exerting herself, though she does not drop her oxygenation in the emergency department. CT shows bud in tree appearance, will need further workup by pulmonology.   Rozann Holts Julio Alm, MD 03/31/16 [phone]

## 2016-03-31 NOTE — Discharge Instructions (Signed)
Apixaban oral tablets °What is this medicine? °APIXABAN (a PIX a ban) is an anticoagulant (blood thinner). It is used to lower the chance of stroke in people with a medical condition called atrial fibrillation. It is also used to treat or prevent blood clots in the lungs or in the veins. °This medicine may be used for other purposes; ask your health care provider or pharmacist if you have questions. °What should I tell my health care provider before I take this medicine? °They need to know if you have any of these conditions: °-bleeding disorders °-bleeding in the brain °-blood in your stools (black or tarry stools) or if you have blood in your vomit °-history of stomach bleeding °-kidney disease °-liver disease °-mechanical heart valve °-an unusual or allergic reaction to apixaban, other medicines, foods, dyes, or preservatives °-pregnant or trying to get pregnant °-breast-feeding °How should I use this medicine? °Take this medicine by mouth with a glass of water. Follow the directions on the prescription label. You can take it with or without food. If it upsets your stomach, take it with food. Take your medicine at regular intervals. Do not take it more often than directed. Do not stop taking except on your doctor's advice. Stopping this medicine may increase your risk of a blot clot. Be sure to refill your prescription before you run out of medicine. °Talk to your pediatrician regarding the use of this medicine in children. Special care may be needed. °Overdosage: If you think you have taken too much of this medicine contact a poison control center or emergency room at once. °NOTE: This medicine is only for you. Do not share this medicine with others. °What if I miss a dose? °If you miss a dose, take it as soon as you can. If it is almost time for your next dose, take only that dose. Do not take double or extra doses. °What may interact with this medicine? °This medicine may interact with the following: °-aspirin  and aspirin-like medicines °-certain medicines for fungal infections like ketoconazole and itraconazole °-certain medicines for seizures like carbamazepine and phenytoin °-certain medicines that treat or prevent blood clots like warfarin, enoxaparin, and dalteparin °-clarithromycin °-NSAIDs, medicines for pain and inflammation, like ibuprofen or naproxen °-rifampin °-ritonavir °-St. John's wort °This list may not describe all possible interactions. Give your health care provider a list of all the medicines, herbs, non-prescription drugs, or dietary supplements you use. Also tell them if you smoke, drink alcohol, or use illegal drugs. Some items may interact with your medicine. °What should I watch for while using this medicine? °Notify your doctor or health care professional and seek emergency treatment if you develop breathing problems; changes in vision; chest pain; severe, sudden headache; pain, swelling, warmth in the leg; trouble speaking; sudden numbness or weakness of the face, arm, or leg. These can be signs that your condition has gotten worse. °If you are going to have surgery, tell your doctor or health care professional that you are taking this medicine. °Tell your health care professional that you use this medicine before you have a spinal or epidural procedure. Sometimes people who take this medicine have bleeding problems around the spine when they have a spinal or epidural procedure. This bleeding is very rare. If you have a spinal or epidural procedure while on this medicine, call your health care professional immediately if you have back pain, numbness or tingling (especially in your legs and feet), muscle weakness, paralysis, or loss of bladder or bowel   control. °Avoid sports and activities that might cause injury while you are using this medicine. Severe falls or injuries can cause unseen bleeding. Be careful when using sharp tools or knives. Consider using an electric razor. Take special care  brushing or flossing your teeth. Report any injuries, bruising, or red spots on the skin to your doctor or health care professional. °What side effects may I notice from receiving this medicine? °Side effects that you should report to your doctor or health care professional as soon as possible: °-allergic reactions like skin rash, itching or hives, swelling of the face, lips, or tongue °-signs and symptoms of bleeding such as bloody or black, tarry stools; red or dark-brown urine; spitting up blood or brown material that looks like coffee grounds; red spots on the skin; unusual bruising or bleeding from the eye, gums, or nose °This list may not describe all possible side effects. Call your doctor for medical advice about side effects. You may report side effects to FDA at 1-800-FDA-1088. °Where should I keep my medicine? °Keep out of the reach of children. °Store at room temperature between 20 and 25 degrees C (68 and 77 degrees F). Throw away any unused medicine after the expiration date. °NOTE: This sheet is a summary. It may not cover all possible information. If you have questions about this medicine, talk to your doctor, pharmacist, or health care provider. °  °© 2016, [employer]. (2013-04-12 11:59:24) ° °

## 2016-03-31 NOTE — Progress Notes (Signed)
  Echocardiogram 2D Echocardiogram has been performed.  Jennette Dubin 03/31/2016, 10:57 AM

## 2016-04-01 ENCOUNTER — Inpatient Hospital Stay (HOSPITAL_COMMUNITY): Payer: Medicare Other

## 2016-04-01 ENCOUNTER — Institutional Professional Consult (permissible substitution): Payer: Medicare Other | Admitting: Emergency Medicine

## 2016-04-01 DIAGNOSIS — R918 Other nonspecific abnormal finding of lung field: Secondary | ICD-10-CM

## 2016-04-01 DIAGNOSIS — R0600 Dyspnea, unspecified: Secondary | ICD-10-CM | POA: Diagnosis present

## 2016-04-01 DIAGNOSIS — I824Z2 Acute embolism and thrombosis of unspecified deep veins of left distal lower extremity: Secondary | ICD-10-CM | POA: Diagnosis not present

## 2016-04-01 DIAGNOSIS — I82402 Acute embolism and thrombosis of unspecified deep veins of left lower extremity: Secondary | ICD-10-CM | POA: Diagnosis not present

## 2016-04-01 DIAGNOSIS — I824Z9 Acute embolism and thrombosis of unspecified deep veins of unspecified distal lower extremity: Secondary | ICD-10-CM

## 2016-04-01 DIAGNOSIS — R0602 Shortness of breath: Secondary | ICD-10-CM | POA: Diagnosis not present

## 2016-04-01 LAB — PULMONARY FUNCTION TEST
DL/VA % PRED: 73 %
DL/VA: 3.22 ml/min/mmHg/L
DLCO COR: 13.35 ml/min/mmHg
DLCO UNC % PRED: 63 %
DLCO cor % pred: 66 %
DLCO unc: 12.83 ml/min/mmHg
FEF 25-75 POST: 1.41 L/s
FEF 25-75 PRE: 1.61 L/s
FEF2575-%CHANGE-POST: -12 %
FEF2575-%PRED-PRE: 99 %
FEF2575-%Pred-Post: 87 %
FEV1-%Change-Post: -3 %
FEV1-%PRED-PRE: 112 %
FEV1-%Pred-Post: 107 %
FEV1-POST: 2.06 L
FEV1-Pre: 2.14 L
FEV1FVC-%CHANGE-POST: 3 %
FEV1FVC-%PRED-PRE: 98 %
FEV6-%CHANGE-POST: -7 %
FEV6-%PRED-PRE: 119 %
FEV6-%Pred-Post: 110 %
FEV6-Post: 2.68 L
FEV6-Pre: 2.89 L
FEV6FVC-%Change-Post: 0 %
FEV6FVC-%Pred-Post: 105 %
FEV6FVC-%Pred-Pre: 105 %
FVC-%Change-Post: -7 %
FVC-%Pred-Post: 105 %
FVC-%Pred-Pre: 113 %
FVC-Post: 2.68 L
FVC-Pre: 2.89 L
POST FEV1/FVC RATIO: 77 %
POST FEV6/FVC RATIO: 100 %
PRE FEV1/FVC RATIO: 74 %
Pre FEV6/FVC Ratio: 100 %
RV % pred: 100 %
RV: 2.1 L
TLC % pred: 106 %
TLC: 4.88 L

## 2016-04-01 LAB — RESPIRATORY PANEL BY PCR
Adenovirus: NOT DETECTED
BORDETELLA PERTUSSIS-RVPCR: NOT DETECTED
CHLAMYDOPHILA PNEUMONIAE-RVPPCR: NOT DETECTED
CORONAVIRUS NL63-RVPPCR: NOT DETECTED
Coronavirus 229E: NOT DETECTED
Coronavirus HKU1: NOT DETECTED
Coronavirus OC43: NOT DETECTED
INFLUENZA A H1-RVPPCR: NOT DETECTED
INFLUENZA A-RVPPCR: NOT DETECTED
Influenza A H1 2009: NOT DETECTED
Influenza A H3: NOT DETECTED
Influenza B: NOT DETECTED
Metapneumovirus: NOT DETECTED
Mycoplasma pneumoniae: NOT DETECTED
PARAINFLUENZA VIRUS 3-RVPPCR: NOT DETECTED
PARAINFLUENZA VIRUS 4-RVPPCR: NOT DETECTED
Parainfluenza Virus 1: NOT DETECTED
Parainfluenza Virus 2: NOT DETECTED
RESPIRATORY SYNCYTIAL VIRUS-RVPPCR: NOT DETECTED
RHINOVIRUS / ENTEROVIRUS - RVPPCR: NOT DETECTED

## 2016-04-01 LAB — C4 COMPLEMENT: COMPLEMENT C4, BODY FLUID: 28 mg/dL (ref 14–44)

## 2016-04-01 LAB — BASIC METABOLIC PANEL
Anion gap: 9 (ref 5–15)
BUN: 23 mg/dL — ABNORMAL HIGH (ref 6–20)
CALCIUM: 9.2 mg/dL (ref 8.9–10.3)
CO2: 26 mmol/L (ref 22–32)
CREATININE: 1.01 mg/dL — AB (ref 0.44–1.00)
Chloride: 104 mmol/L (ref 101–111)
GFR, EST NON AFRICAN AMERICAN: 54 mL/min — AB (ref >60)
Glucose, Bld: 93 mg/dL (ref 65–99)
Potassium: 3.9 mmol/L (ref 3.5–5.1)
SODIUM: 139 mmol/L (ref 135–145)

## 2016-04-01 LAB — RHEUMATOID FACTOR: Rhuematoid fact SerPl-aCnc: <10 IU/mL (ref 0.0–13.9)

## 2016-04-01 LAB — C3 COMPLEMENT: C3 COMPLEMENT: 118 mg/dL (ref 82–167)

## 2016-04-01 LAB — ANTINUCLEAR ANTIBODIES, IFA: ANA Ab, IFA: NEGATIVE

## 2016-04-01 LAB — PHOSPHORUS: PHOSPHORUS: 4.6 mg/dL (ref 2.5–4.6)

## 2016-04-01 LAB — MAGNESIUM: MAGNESIUM: 2 mg/dL (ref 1.7–2.4)

## 2016-04-01 LAB — TROPONIN I

## 2016-04-01 MED ORDER — ALBUTEROL SULFATE (2.5 MG/3ML) 0.083% IN NEBU
2.5000 mg | INHALATION_SOLUTION | Freq: Once | RESPIRATORY_TRACT | Status: DC
  Filled 2016-04-01: qty 3

## 2016-04-01 MED ORDER — SODIUM CHLORIDE 0.9 % IV SOLN
INTRAVENOUS | Status: DC
  Administered 2016-04-01: 10:00:00 via INTRAVENOUS

## 2016-04-01 MED ORDER — APIXABAN 5 MG PO TABS: 5.0000 mg | ORAL_TABLET | Freq: Two times a day (BID) | ORAL | 1 refills | Status: DC

## 2016-04-01 NOTE — Discharge Summary (Signed)
Physician Discharge Summary  HIDI LANSER [id number] DOB: [birth year] DOA: 03/30/2016  PCP: Dwan Bolt, MD  Admit date: 03/30/2016 Discharge date: 04/01/2016  Time spent: >35 minutes  Recommendations for Outpatient Follow-up:  F/u with pulmonology as outpatient in 2 weeks F/u with PCP in 7-10 days as needed Discharge Diagnoses:  Active Problems:   Deep vein thrombosis (DVT) of distal vein of lower extremity (HCC)   Pulmonary infiltrate   Tachypnea   SOB (shortness of breath)   Dyspnea   Discharge Condition: stable   Diet recommendation: low sodium   Filed Weights   03/30/16 1908 03/31/16 0528 04/01/16 0505  Weight: 56 kg (123 lb 8 oz) 55.8 kg (123 lb 1.6 oz) 55.8 kg (123 lb 1.6 oz)    History of present illness:  70yo female former smoker, HTN, Anxiety, Chronic pain, Recent dx DVT treated with xarelto (no PE on CT) and recent hx PNA rx as outpt.  She presented 8/9 to outpt cardiology office with 3 week progressive SOB.  In cardiology office she was noted to be significantly SOB, hyperventilating but had O2 sats 100% on RA.  She was sent to ER for further eval.  In ER ABG revealed respiratory alkalosis and hypoxia.  Repeat CTA chest was again neg for PE, doppler ultrasound showed left leg dvt   Hospital Course:  Recent left DVT. Left leg doppler US+. Patient was on xarelto (patient reported progressive shortness of breath with xarelto), transitioned to apixaban after discussion with pulmonology   Dyspnea. Unclear etiology. CTA chest : no PE. Echo: LVEF 55%. Patient received levofloxacin for questionable atypical infection  -Pulmonology will follow up as outpatient, made appointment. Pend autoimmune panel    HTN. Cont amlodipine, hold ramipril due to soft BP  Procedures: Echo: Study Conclusions  - Left ventricle: The cavity size was normal. There was mild focal   basal hypertrophy of the septum. Systolic function was normal.   The estimated ejection fraction was  in the range of 50% to 55%.   Wall motion was normal; there were no regional wall motion   abnormalities. Doppler parameters are consistent with abnormal   left ventricular relaxation (grade 1 diastolic dysfunction).   Doppler parameters are consistent with indeterminate ventricular   filling pressure. - Aortic valve: Transvalvular velocity was within the normal range.   There was no stenosis. There was mild regurgitation. - Mitral valve: Transvalvular velocity was within the normal range.   There was no evidence for stenosis. There was trivial   regurgitation. - Right ventricle: The cavity size was normal. Wall thickness was   normal. Systolic function was normal. - Tricuspid valve: There was trivial regurgitation. - Pulmonary arteries: Systolic pressure was within the normal    range. PA peak pressure: 25 mm Hg (S). (i.e. Studies not automatically included, echos, thoracentesis, etc; not x-rays)  Consultations:  Pulmonology   Discharge Exam: Vitals:   04/01/16 0505 04/01/16 1144  BP: (!) 88/62 119/71  Pulse: (!) 108 (!) 107  Resp: 18 16  Temp: 97.8 F (36.6 C) 97.8 F (36.6 C)    General: alert Cardiovascular: s1,s2 rrr Respiratory: CTA BL  Discharge Instructions  Discharge Instructions    Diet - low sodium heart healthy    Complete by:  As directed   Increase activity slowly    Complete by:  As directed       Medication List    STOP taking these medications   naproxen 500 MG tablet Commonly known as:  NAPROSYN  ramipril 10 MG capsule Commonly known as:  ALTACE   rivaroxaban 20 MG Tabs tablet Commonly known as:  XARELTO     TAKE these medications   amLODipine 5 MG tablet Commonly known as:  NORVASC Take 5 mg by mouth at bedtime.   apixaban 5 MG Tabs tablet Commonly known as:  ELIQUIS Take 1 tablet (5 mg total) by mouth 2 (two) times daily.   Biotin 5 MG Caps Take 5 mg by mouth daily.   diazepam 10 MG tablet Commonly known as:  VALIUM Take 10  mg by mouth at bedtime.   fexofenadine 30 MG tablet Commonly known as:  ALLEGRA Take 30 mg by mouth daily.   levothyroxine 112 MCG tablet Commonly known as:  SYNTHROID, LEVOTHROID Take 112 mcg by mouth daily before breakfast. Brand name only**      Allergies  Allergen Reactions  . Imitrex [Sumatriptan] Other (See Comments)    syncope  . Celebrex [Celecoxib] Other (See Comments)    palpations   . Codeine Itching  . Percocet [Oxycodone-Acetaminophen] Itching   Follow-up Information    Rexene Edison, NP Follow up on 04/12/2016.   Specialty:  Pulmonary Disease Why:  Appt at 9:30 AM Contact information: [address] [place] [address] [phone]            The results of significant diagnostics from this hospitalization (including imaging, microbiology, ancillary and laboratory) are listed below for reference.    Significant Diagnostic Studies: Dg Chest 2 View  Result Date: 03/31/2016 CLINICAL DATA:  Increasing shortness of breath. EXAM: CHEST  2 VIEW COMPARISON:  Chest CT 03/30/2016 FINDINGS: Cardiomediastinal silhouette is normal. Mediastinal contours appear intact. Ascending thoracic aortic aneurysm is not well seen radiographically. There is no evidence of pleural effusion or pneumothorax. The lungs are hyperinflated. Mild increase of the interstitial markings may be seen with interstitial pulmonary edema. Small areas of airspace consolidation versus pulmonary nodules are seen in the right lung base. Osseous structures are without acute abnormality. Soft tissues are grossly normal. IMPRESSION: Mildly hyperinflated lungs with interstitial pulmonary edema. Small areas of airspace consolidation versus pulmonary nodules in the right lung base. Electronically Signed   By: Fidela Salisbury M.D.   On: 03/31/2016 18:22   Dg Chest 2 View  Result Date: 03/30/2016 CLINICAL DATA:  Progressive shortness of breath. EXAM: CHEST  2 VIEW COMPARISON:  Chest x-ray and CT scan  dated 03/19/2016 FINDINGS: Heart size and pulmonary vascularity are normal. Tortuosity of the thoracic aorta. Scarring at the lung bases is stable. Old healed left posterior seventh rib fracture. No effusions. IMPRESSION: No acute abnormalities. Scarring at the lung bases. Chronic focal bronchiectasis in the right middle lobe and in the lingula. Electronically Signed   By: Lorriane Shire M.D.   On: 03/30/2016 15:58   Dg Chest 2 View  Result Date: 03/19/2016 CLINICAL DATA:  Shortness of breath, worsening last 4 days. EXAM: CHEST  2 VIEW COMPARISON:  03/08/2016 FINDINGS: Linear scarring in the lingula. Right lung is clear. Heart is normal size. No confluent airspace opacity or effusion. No acute bony abnormality. Old healed left rib fracture. IMPRESSION: Lingular scarring.  No active disease. Electronically Signed   By: Rolm Baptise M.D.   On: 03/19/2016 13:28  Ct Angio Chest Pe W And/or Wo Contrast  Result Date: 03/30/2016 CLINICAL DATA:  70 year old female with increasing shortness of breath. Patient with known DVT. EXAM: CT ANGIOGRAPHY CHEST WITH CONTRAST TECHNIQUE: Multidetector CT imaging of the chest was performed  using the standard protocol during bolus administration of intravenous contrast. Multiplanar CT image reconstructions and MIPs were obtained to evaluate the vascular anatomy. CONTRAST:  100 cc intravenous Isovue 370 COMPARISON:  None. FINDINGS: Cardiovascular: This is a technically satisfactory study. No pulmonary emboli are identified. Ascending thoracic aortic aneurysm measuring 4.1 cm in greatest diameter is unchanged. Cardiomegaly again noted as well as mild coronary artery atherosclerotic calcifications. Mediastinum/Nodes: No mediastinal mass, enlarged lymph nodes or pericardial effusion. Lungs/Pleura: Nonspecific bilateral ground-glass opacities are now noted and may represent edema source small airway/small-vessel disease. Nodular opacities within the lingula, right middle lobe and right  lower lobe again noted without significant change. The largest nodule measures 7 mm in the right lower lobe (image 66). No pleural effusion or pneumothorax identified. Upper Abdomen: No acute or significant abnormality. Musculoskeletal: No acute or suspicious abnormalities. Review of the MIP images confirms the above findings. IMPRESSION: No evidence of pulmonary emboli. New nonspecific bilateral ground-glass opacities-question edema versus small airway/ small-vessel disease. Nodular opacities within the lingula and right middle and right lower lobes -likely infectious or inflammatory. Unchanged ascending thoracic aortic aneurysm measuring 4.1 cm. Cardiomegaly and coronary artery disease. Electronically Signed   By: Margarette Canada M.D.   On: 03/30/2016 18:17   Ct Angio Chest Pe W Or Wo Contrast  Result Date: 03/19/2016 CLINICAL DATA:  Chest and back pain with weakness EXAM: CT ANGIOGRAPHY CHEST WITH CONTRAST TECHNIQUE: Multidetector CT imaging of the chest was performed using the standard protocol during bolus administration of intravenous contrast. Multiplanar CT image reconstructions and MIPs were obtained to evaluate the vascular anatomy. CONTRAST:  100 mL Isovue 370. COMPARISON:  03/08/2016 FINDINGS: Mediastinum/Lymph Nodes: No significant hilar or mediastinal adenopathy is identified. The thoracic inlet is within normal limits. Cardiovascular: There is mild atherosclerotic change of the thoracic aorta. Dilatation to 4.1 cm of the ascending portion is noted. No significant coronary calcifications are seen. Pulmonary artery is well visualized and demonstrates a normal branching pattern. No filling defects to suggest pulmonary embolism are identified. Lungs/Pleura: The lungs are well aerated bilaterally. Stable atelectatic changes are noted in the lingula. Mild persistent infiltrate is noted in the right lower lobe stable from the prior exam. The nodular changes in the right lung base have improved somewhat in  size when compared with the prior exam. This is likely related to some improvement in the inflammatory change. No new focal infiltrate is seen. Upper abdomen: No acute findings. Musculoskeletal: No chest wall mass or suspicious bone lesions identified. 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 Review of the MIP images confirms the above findings. IMPRESSION: No evidence of pulmonary emboli. Stable dilatation of the ascending aorta. Recommend annual imaging followup by CTA or MRA. This recommendation follows 2010 ACCF/AHA/AATS/ACR/ASA/SCA/SCAI/SIR/STS/SVM Guidelines for the Diagnosis and Management of Patients with Thoracic Aortic Disease. Circulation. 2010; 121: [id number] Persistent changes in the lungs bilaterally with some slight improvement in the nodular changes in the right lower lobe consistent with postinflammatory change Electronically Signed   By: Inez Catalina M.D.   On: 03/19/2016 16:22  Ct Angio Chest Pe W Or Wo Contrast  Result Date: 03/08/2016 CLINICAL DATA:  Shortness of breath, new LEFT lower extremity deep vein thrombosis, on Xarelto. Assess for pulmonary embolism. EXAM: CT ANGIOGRAPHY CHEST WITH CONTRAST TECHNIQUE: Multidetector CT imaging of the chest was performed using the standard protocol during bolus administration of intravenous contrast. Multiplanar CT image reconstructions and MIPs were obtained to evaluate the vascular anatomy. CONTRAST:  80 cc Isovue  370 COMPARISON:  Chest radiograph March 02, 2010 FINDINGS: PULMONARY ARTERY: Adequate contrast opacification of the pulmonary artery's. Main pulmonary artery is not enlarged. No pulmonary arterial filling defects to the level of the subsegmental branches. MEDIASTINUM: Heart and pericardium are unremarkable, no right heart strain. Trace coronary artery calcifications. Thoracic aorta is normal course and caliber, mild calcific atherosclerosis. No lymphadenopathy by CT size criteria. LUNGS: Tracheobronchial tree is patent, no pneumothorax.  Multiple sub solid RIGHT middle and RIGHT lower lobe pulmonary nodules measuring up to 7 mm, to lesser extent LEFT lower lobe and lingula. Tree-in-bud infiltrates RIGHT lower lobe. No pleural effusion. Bibasilar atelectasis. SOFT TISSUES AND OSSEOUS STRUCTURES: Included view of the abdomen is unremarkable. Visualized soft tissues included osseous structure nonsuspicious, old LEFT rib fractures. ACDF. Approximate T9 hemangioma. Review of the MIP images confirms the above findings. IMPRESSION: No acute pulmonary embolism. Tree-in-bud infiltrates RIGHT lower lobe may be infectious or inflammatory. Bibasilar sub solid pulmonary nodules measure up to 7 mm. Initial follow-up by chest CT without contrast is recommended in 3 months to confirm persistence. This recommendation follows the consensus statement: Recommendations for the Management of Subsolid Pulmonary Nodules Detected at CT: A Statement from the [place] as published in Radiology 2013; 266:304-317. Electronically Signed   By: Elon Alas M.D.   On: 03/08/2016 13:56   Nm Pulmonary Perf And Vent  Result Date: 03/31/2016 CLINICAL DATA:  Progressive shortness of breath. EXAM: NUCLEAR MEDICINE VENTILATION - PERFUSION LUNG SCAN TECHNIQUE: Ventilation images were obtained in multiple projections using inhaled aerosol Tc-56m DTPA. Perfusion images were obtained in multiple projections after intravenous injection of Tc-61m MAA. RADIOPHARMACEUTICALS:  32 mCi Technetium-13m DTPA aerosol inhalation and 4.3 mCi Technetium-39m MAA IV COMPARISON:  Chest radiography 03/31/2016 FINDINGS: Ventilation: No focal ventilation defect. Perfusion: No wedge shaped peripheral perfusion defects to suggest acute pulmonary embolism. Matched ventilation/perfusion patchy areas of decreased uptake are seen in the right lower lobe and throughout the left lung. IMPRESSION: Bilateral patchy matched ventilation profusion defects. Intermediate probability for pulmonary embolus.  Electronically Signed   By: Fidela Salisbury M.D.   On: 03/31/2016 18:18    Microbiology: Recent Results (from the past 240 hour(s))  Respiratory Panel by PCR     Status: None   Collection Time: 03/31/16  9:06 PM  Result Value Ref Range Status   Adenovirus NOT DETECTED NOT DETECTED Final   Coronavirus 229E NOT DETECTED NOT DETECTED Final   Coronavirus HKU1 NOT DETECTED NOT DETECTED Final   Coronavirus NL63 NOT DETECTED NOT DETECTED Final   Coronavirus OC43 NOT DETECTED NOT DETECTED Final   Metapneumovirus NOT DETECTED NOT DETECTED Final   Rhinovirus / Enterovirus NOT DETECTED NOT DETECTED Final   Influenza A NOT DETECTED NOT DETECTED Final   Influenza A H1 NOT DETECTED NOT DETECTED Final   Influenza A H1 2009 NOT DETECTED NOT DETECTED Final   Influenza A H3 NOT DETECTED NOT DETECTED Final   Influenza B NOT DETECTED NOT DETECTED Final   Parainfluenza Virus 1 NOT DETECTED NOT DETECTED Final   Parainfluenza Virus 2 NOT DETECTED NOT DETECTED Final   Parainfluenza Virus 3 NOT DETECTED NOT DETECTED Final   Parainfluenza Virus 4 NOT DETECTED NOT DETECTED Final   Respiratory Syncytial Virus NOT DETECTED NOT DETECTED Final   Bordetella pertussis NOT DETECTED NOT DETECTED Final   Chlamydophila pneumoniae NOT DETECTED NOT DETECTED Final   Mycoplasma pneumoniae NOT DETECTED NOT DETECTED Final     Labs: Basic Metabolic Panel:  Recent Labs Lab 03/30/16 1436  03/31/16 0101 04/01/16 0249  NA 139 139 139  K 4.2 3.7 3.9  CL 108 108 104  CO2 24 24 26   GLUCOSE 78 82 93  BUN 15 17 23*  CREATININE 0.81 0.79 1.01*  CALCIUM 9.4 8.6* 9.2  MG  --   --  2.0  PHOS  --   --  4.6   Liver Function Tests:  Recent Labs Lab 03/31/16 0101  AST 21  ALT 15  ALKPHOS 69  BILITOT 0.4  PROT 5.7*  ALBUMIN 3.3*   No results for input(s): LIPASE, AMYLASE in the last 168 hours. No results for input(s): AMMONIA in the last 168 hours. CBC:  Recent Labs Lab 03/30/16 1436 03/31/16 0101  WBC 5.5  5.1  HGB 13.3 12.2  HCT 41.1 38.0  MCV 96.5 96.0  PLT 326 305   Cardiac Enzymes:  Recent Labs Lab 03/30/16 2050 03/31/16 0101 03/31/16 0748 04/01/16 0722  TROPONINI <0.03 <0.03 <0.03 <0.03   BNP: BNP (last 3 results)  Recent Labs  03/30/16 1920  BNP 36.0    ProBNP (last 3 results) No results for input(s): PROBNP in the last 8760 hours.  CBG:  Recent Labs Lab 03/30/16 1458  GLUCAP 71       Signed:  Raeanna Soberanes N  [facility] 04/01/2016, 2:17 PM

## 2016-04-01 NOTE — Consult Note (Signed)
Name: Gina Lyons MRN: [id number] DOB: 25-Jun-1946    ADMISSION DATE:  03/30/2016 CONSULTATION DATE:  8/10  REFERRING MD :  Maudie Mercury ([facility])   CHIEF COMPLAINT:  Dyspnea   BRIEF PATIENT DESCRIPTION: 70yo female former smoker (quit 53yrs ago, only 3 pack year hx) with hx HTN, anxiety, chronic pain, recent dx DVT treated with xarelto (no PE on CT) and recent hx PNA rx as outpt.  She presented 8/9 to outpt cardiology office with 3 week progressive SOB.  In cardiology office she was noted to be significantly SOB, hyperventilating but had O2 sats 100% on RA.  She was sent to ER for further eval.  In ER ABG revealed respiratory alkalosis and hypoxia.  Repeat CTA chest was again neg for PE.  BNP, troponin, wbc all wnl.  She was admitted by [facility] and PCCM consulted 8/10.   SIGNIFICANT EVENTS    STUDIES:  CTA chest 8/9>>> neg PE, New nonspecific bilateral ground-glass opacities-question edema versus small airway/ small-vessel disease.  Nodular opacities within the lingula and right middle and right lower lobes -likely infectious or inflammatory. 2D echo 8/10>>>EF [id number], grade 1 diastolic dysfunction, trivial MR, trivial TR, PA pressure 40mmHg   VQ moderate  SUBJECTIVE:  Feels better after lasix  VITAL SIGNS: Temp:  [97.8 F (36.6 C)-98.3 F (36.8 C)] 97.8 F (36.6 C) (08/11 1144) Pulse Rate:  [83-108] 107 (08/11 1144) Resp:  [16-20] 16 (08/11 1144) BP: (88-119)/(62-71) 119/71 (08/11 1144) SpO2:  [91 %-97 %] 95 % (08/11 1144) Weight:  [55.8 kg (123 lb 1.6 oz)] 55.8 kg (123 lb 1.6 oz) (08/11 0505)  PHYSICAL EXAMINATION: General:  Pleasant, thin female, NAD, resting in bed  Neuro:  Awake, alert, appropriate, MAE  HEENT:  Mm moist, no JVD  Cardiovascular:  s1s2 rrr  Lungs:  resps even, completely comfortable, non labored, not tachypneic at rest, clear throughout  Abdomen:  Round, soft, non tender  Musculoskeletal:  Warm and dry, slight LLE edema and tenderness (DVT)    Recent Labs Lab  03/30/16 1436 03/31/16 0101 04/01/16 0249  NA 139 139 139  K 4.2 3.7 3.9  CL 108 108 104  CO2 24 24 26   BUN 15 17 23*  CREATININE 0.81 0.79 1.01*  GLUCOSE 78 82 93    Recent Labs Lab 03/30/16 1436 03/31/16 0101  HGB 13.3 12.2  HCT 41.1 38.0  WBC 5.5 5.1  PLT 326 305   Dg Chest 2 View  Result Date: 03/31/2016 CLINICAL DATA:  Increasing shortness of breath. EXAM: CHEST  2 VIEW COMPARISON:  Chest CT 03/30/2016 FINDINGS: Cardiomediastinal silhouette is normal. Mediastinal contours appear intact. Ascending thoracic aortic aneurysm is not well seen radiographically. There is no evidence of pleural effusion or pneumothorax. The lungs are hyperinflated. Mild increase of the interstitial markings may be seen with interstitial pulmonary edema. Small areas of airspace consolidation versus pulmonary nodules are seen in the right lung base. Osseous structures are without acute abnormality. Soft tissues are grossly normal. IMPRESSION: Mildly hyperinflated lungs with interstitial pulmonary edema. Small areas of airspace consolidation versus pulmonary nodules in the right lung base. Electronically Signed   By: Fidela Salisbury M.D.   On: 03/31/2016 18:22   Dg Chest 2 View  Result Date: 03/30/2016 CLINICAL DATA:  Progressive shortness of breath. EXAM: CHEST  2 VIEW COMPARISON:  Chest x-ray and CT scan dated 03/19/2016 FINDINGS: Heart size and pulmonary vascularity are normal. Tortuosity of the thoracic aorta. Scarring at the lung bases is stable. Old  healed left posterior seventh rib fracture. No effusions. IMPRESSION: No acute abnormalities. Scarring at the lung bases. Chronic focal bronchiectasis in the right middle lobe and in the lingula. Electronically Signed   By: Lorriane Shire M.D.   On: 03/30/2016 15:58   Ct Angio Chest Pe W And/or Wo Contrast  Result Date: 03/30/2016 CLINICAL DATA:  70 year old female with increasing shortness of breath. Patient with known DVT. EXAM: CT ANGIOGRAPHY CHEST  WITH CONTRAST TECHNIQUE: Multidetector CT imaging of the chest was performed using the standard protocol during bolus administration of intravenous contrast. Multiplanar CT image reconstructions and MIPs were obtained to evaluate the vascular anatomy. CONTRAST:  100 cc intravenous Isovue 370 COMPARISON:  None. FINDINGS: Cardiovascular: This is a technically satisfactory study. No pulmonary emboli are identified. Ascending thoracic aortic aneurysm measuring 4.1 cm in greatest diameter is unchanged. Cardiomegaly again noted as well as mild coronary artery atherosclerotic calcifications. Mediastinum/Nodes: No mediastinal mass, enlarged lymph nodes or pericardial effusion. Lungs/Pleura: Nonspecific bilateral ground-glass opacities are now noted and may represent edema source small airway/small-vessel disease. Nodular opacities within the lingula, right middle lobe and right lower lobe again noted without significant change. The largest nodule measures 7 mm in the right lower lobe (image 66). No pleural effusion or pneumothorax identified. Upper Abdomen: No acute or significant abnormality. Musculoskeletal: No acute or suspicious abnormalities. Review of the MIP images confirms the above findings. IMPRESSION: No evidence of pulmonary emboli. New nonspecific bilateral ground-glass opacities-question edema versus small airway/ small-vessel disease. Nodular opacities within the lingula and right middle and right lower lobes -likely infectious or inflammatory. Unchanged ascending thoracic aortic aneurysm measuring 4.1 cm. Cardiomegaly and coronary artery disease. Electronically Signed   By: Margarette Canada M.D.   On: 03/30/2016 18:17   Nm Pulmonary Perf And Vent  Result Date: 03/31/2016 CLINICAL DATA:  Progressive shortness of breath. EXAM: NUCLEAR MEDICINE VENTILATION - PERFUSION LUNG SCAN TECHNIQUE: Ventilation images were obtained in multiple projections using inhaled aerosol Tc-29m DTPA. Perfusion images were obtained in  multiple projections after intravenous injection of Tc-64m MAA. RADIOPHARMACEUTICALS:  32 mCi Technetium-3m DTPA aerosol inhalation and 4.3 mCi Technetium-35m MAA IV COMPARISON:  Chest radiography 03/31/2016 FINDINGS: Ventilation: No focal ventilation defect. Perfusion: No wedge shaped peripheral perfusion defects to suggest acute pulmonary embolism. Matched ventilation/perfusion patchy areas of decreased uptake are seen in the right lower lobe and throughout the left lung. IMPRESSION: Bilateral patchy matched ventilation profusion defects. Intermediate probability for pulmonary embolus. Electronically Signed   By: Fidela Salisbury M.D.   On: 03/31/2016 18:18    ASSESSMENT / PLAN:  Dyspnea - unclear etiology. Ground glass plus underlying PE Respiratory alkalosis  Hypoxia  DVT with likely PE - ?unprovoked PE.   neg viral dlco low  REC-  I feel what is making her SOB is presumed PE underlying but a interstial ground glass changes DLco low, will send remainder autoimmune panel now She is better with lasix, diastolic? HF contribution>? Dc lasix with crt up Needs follow up crt, lytes Walk ambulation pulse ox VQ moderate with pretest prob moderate to high = PE If remains with SOB or reoccurs ( as is better) would need bronch bal Can follow up with Dr Chase Caller, tammy parrett, we have made this appt If she does not desat, she can go hom ewith above follow up Dc abx  Lavon Paganini. Titus Mould, MD, [place] P[facility]: [facility] 04/01/2016 1:57 PM

## 2016-04-01 NOTE — Care Management CC44 (Signed)
Condition Code 44 Documentation Completed  Patient Details  Name: Gina Lyons MRN: [id number] Date of Birth: 12-28-1945   Condition Code 44 given:  Yes Patient signature on Condition Code 44 notice:  Yes Documentation of 2 MD's agreement:  Yes Code 44 added to claim:  Yes    Royston Bake, RN 04/01/2016, 12:33 PM

## 2016-04-01 NOTE — Progress Notes (Signed)
Patient ID: Gina Lyons, female   DOB: 13-Nov-1945, 70 y.o.   MRN: [id number]                                                                PROGRESS NOTE                                                                                                                                                                                                             Patient Demographics:    Gina Lyons, is a 70 y.o. female, DOB - 1945/12/09, [id number]  Admit date - 03/30/2016   Admitting Physician Gina Gravel, MD  Outpatient Primary MD for the patient is Gina Bolt, MD  LOS - 1  Outpatient Specialists: Gina Lyons  Chief Complaint  Patient presents with  . Shortness of Breath  . Weakness       Brief Narrative  Gina Lyons  is a 70 y.o. female, w/ DVT apparently presents with complaints of sob for the past 3weeks. This has started since discovering her blood clot.  Denies fever, chills, wt loss, cough, cp, palp. Lying down makes her feel better.  Exertion makes it worse.  Pt was apparently seen by Gina Lyons 8/8 and sent to ER for evaluation.  Pox 100% on ra at [facility] office.    In ED,  Pt had CTA chest which was negative for PE ABG showed po2=77 (low), and Pco2 17 (low),  Pt will be admitted for dyspnea.     Subjective:    Gina Lyons today states that her breathing is improved.  Denies headache, syncope, dizzines, cp, palp, n/v.     Assessment  & Plan :    Active Problems:   Acute deep vein thrombosis (DVT) of left lower extremity (HCC)   Pulmonary infiltrate   Tachypnea   SOB (shortness of breath)   Hypotension pt states that her bp is labile Hold ramipril Ns iv Trop I [id number]  Dyspnea uncertain etiology  ? PE ? infection ? xarelto  VQ scan shows intermediate probability PE Currently on eliquis,   Defer to pulmonary as to whether needs heparin /coumadin PFT pending cont on levaquin 500mg  po qday D#2/7  Hypertension Cont amlodipine, cont  ramipril  DVT  Xarelto=> eliquis Repeat ultrasound pending  Family Communication: Admission, patients condition and plan of care including tests being ordered have been discussed with the patient  who indicate understanding and agree with the plan and Code Status.  Code Status FULL CODE  Likely DC to  home, depending upon pulmonary recommendations  Condition GUARDED    Consults called:  Pulmonary  Admission status: observation  Time spent in minutes : 45 minutes    CTA chest 8/9=>  No evidence of pulmonary emboli.  New nonspecific bilateral ground-glass opacities-question edema versus small airway/ small-vessel disease.  Nodular opacities within the lingula and right middle and right lower lobes -likely infectious or inflammatory.  Unchanged ascending thoracic aortic aneurysm measuring 4.1 cm.  Cardiomegaly and coronary artery disease.  Cardiac echo 8/10=> EF 50-55%, no pulmonary htn,  Mild AR  VQ scan 8/10=> intermediate probably PE    Lab Results  Component Value Date   PLT 305 03/31/2016    Antibiotics  :    Anti-infectives    Start     Dose/Rate Route Frequency Ordered Stop   03/30/16 1845  levofloxacin (LEVAQUIN) tablet 500 mg     500 mg Oral Daily 03/30/16 1844          Objective:   Vitals:   03/31/16 0528 03/31/16 1225 03/31/16 2100 04/01/16 0505  BP: (!) 102/54 116/76 114/69 (!) 88/62  Pulse: 71 72 83 (!) 108  Resp: 15 16 20 18   Temp: 98.2 F (36.8 C) 97.4 F (36.3 C) 98.3 F (36.8 C) 97.8 F (36.6 C)  TempSrc: Oral Oral Oral Oral  SpO2: 98% 95% 91% 97%  Weight: 55.8 kg (123 lb 1.6 oz)   55.8 kg (123 lb 1.6 oz)  Height:        Wt Readings from Last 3 Encounters:  04/01/16 55.8 kg (123 lb 1.6 oz)  03/30/16 57.3 kg (126 lb 6.4 oz)  03/28/16 57.3 kg (126 lb 4.8 oz)     Intake/Output Summary (Last 24 hours) at 04/01/16 0644 Last data filed at 04/01/16 0509  Gross per 24 hour  Intake              655 ml   Output             1500 ml  Net             -845 ml     Physical Exam  Awake Alert, Oriented X 3, No new F.N deficits, Normal affect [place].AT,PERRAL Supple Neck,No JVD, No cervical lymphadenopathy appriciated.  Symmetrical Chest wall movement, Good air movement bilaterally, CTAB RRR,No Gallops,Rubs or new Murmurs, No Parasternal Heave +ve B.Sounds, Abd Soft, No tenderness, No organomegaly appriciated, No rebound - guarding or rigidity. No Cyanosis, Clubbing or edema, No new Rash or bruise      Data Review:    CBC  Recent Labs Lab 03/30/16 1436 03/31/16 0101  WBC 5.5 5.1  HGB 13.3 12.2  HCT 41.1 38.0  PLT 326 305  MCV 96.5 96.0  MCH 31.2 30.8  MCHC 32.4 32.1  RDW 12.5 12.5    Chemistries   Recent Labs Lab 03/30/16 1436 03/31/16 0101 04/01/16 0249  NA 139 139 139  K 4.2 3.7 3.9  CL 108 108 104  CO2 24 24 26   GLUCOSE 78 82 93  BUN 15 17 23*  CREATININE 0.81 0.79 1.01*  CALCIUM 9.4 8.6* 9.2  MG  --   --  2.0  AST  --  21  --   ALT  --  15  --   ALKPHOS  --  69  --   BILITOT  --  0.4  --    ------------------------------------------------------------------------------------------------------------------ No results for input(s): CHOL, HDL, LDLCALC, TRIG, CHOLHDL, LDLDIRECT in the last 72 hours.  No results found for: HGBA1C ------------------------------------------------------------------------------------------------------------------ No results for input(s): TSH, T4TOTAL, T3FREE, THYROIDAB in the last 72 hours.  Invalid input(s): FREET3 ------------------------------------------------------------------------------------------------------------------ No results for input(s): VITAMINB12, FOLATE, FERRITIN, TIBC, IRON, RETICCTPCT in the last 72 hours.  Coagulation profile No results for input(s): INR, PROTIME in the last 168 hours.  No results for input(s): DDIMER in the last 72 hours.  Cardiac Enzymes  Recent Labs Lab 03/30/16 2050 03/31/16 0101  03/31/16 0748  TROPONINI <0.03 <0.03 <0.03   ------------------------------------------------------------------------------------------------------------------    Component Value Date/Time   BNP 36.0 03/30/2016 1920    Inpatient Medications  Scheduled Meds: . amLODipine  5 mg Oral QHS  . apixaban  5 mg Oral BID  . diazepam  10 mg Oral QHS  . furosemide  20 mg Intravenous Q12H  . levofloxacin  500 mg Oral Daily  . levothyroxine  112 mcg Oral QAC breakfast  . loratadine  10 mg Oral Daily  . sodium chloride flush  3 mL Intravenous Q12H   Continuous Infusions: . sodium chloride     PRN Meds:.sodium chloride, acetaminophen **OR** acetaminophen, sodium chloride flush, technetium TC 40M diethylenetriame-pentaacetic acid, zolpidem  Micro Results No results found for this or any previous visit (from the past 240 hour(s)).  Radiology Reports Dg Chest 2 View  Result Date: 03/31/2016 CLINICAL DATA:  Increasing shortness of breath. EXAM: CHEST  2 VIEW COMPARISON:  Chest CT 03/30/2016 FINDINGS: Cardiomediastinal silhouette is normal. Mediastinal contours appear intact. Ascending thoracic aortic aneurysm is not well seen radiographically. There is no evidence of pleural effusion or pneumothorax. The lungs are hyperinflated. Mild increase of the interstitial markings may be seen with interstitial pulmonary edema. Small areas of airspace consolidation versus pulmonary nodules are seen in the right lung base. Osseous structures are without acute abnormality. Soft tissues are grossly normal. IMPRESSION: Mildly hyperinflated lungs with interstitial pulmonary edema. Small areas of airspace consolidation versus pulmonary nodules in the right lung base. Electronically Signed   By: Fidela Salisbury M.D.   On: 03/31/2016 18:22   Dg Chest 2 View  Result Date: 03/30/2016 CLINICAL DATA:  Progressive shortness of breath. EXAM: CHEST  2 VIEW COMPARISON:  Chest x-ray and CT scan dated 03/19/2016 FINDINGS:  Heart size and pulmonary vascularity are normal. Tortuosity of the thoracic aorta. Scarring at the lung bases is stable. Old healed left posterior seventh rib fracture. No effusions. IMPRESSION: No acute abnormalities. Scarring at the lung bases. Chronic focal bronchiectasis in the right middle lobe and in the lingula. Electronically Signed   By: Lorriane Shire M.D.   On: 03/30/2016 15:58   Dg Chest 2 View  Result Date: 03/19/2016 CLINICAL DATA:  Shortness of breath, worsening last 4 days. EXAM: CHEST  2 VIEW COMPARISON:  03/08/2016 FINDINGS: Linear scarring in the lingula. Right lung is clear. Heart is normal size. No confluent airspace opacity or effusion. No acute bony abnormality. Old healed left rib fracture. IMPRESSION: Lingular scarring.  No active disease. Electronically Signed   By: Rolm Baptise M.D.   On: 03/19/2016 13:28  Ct Angio Chest Pe W And/or Wo Contrast  Result Date: 03/30/2016 CLINICAL DATA:  70 year old female with increasing shortness of breath. Patient with known DVT. EXAM: CT ANGIOGRAPHY CHEST WITH CONTRAST TECHNIQUE: Multidetector CT imaging  of the chest was performed using the standard protocol during bolus administration of intravenous contrast. Multiplanar CT image reconstructions and MIPs were obtained to evaluate the vascular anatomy. CONTRAST:  100 cc intravenous Isovue 370 COMPARISON:  None. FINDINGS: Cardiovascular: This is a technically satisfactory study. No pulmonary emboli are identified. Ascending thoracic aortic aneurysm measuring 4.1 cm in greatest diameter is unchanged. Cardiomegaly again noted as well as mild coronary artery atherosclerotic calcifications. Mediastinum/Nodes: No mediastinal mass, enlarged lymph nodes or pericardial effusion. Lungs/Pleura: Nonspecific bilateral ground-glass opacities are now noted and may represent edema source small airway/small-vessel disease. Nodular opacities within the lingula, right middle lobe and right lower lobe again noted  without significant change. The largest nodule measures 7 mm in the right lower lobe (image 66). No pleural effusion or pneumothorax identified. Upper Abdomen: No acute or significant abnormality. Musculoskeletal: No acute or suspicious abnormalities. Review of the MIP images confirms the above findings. IMPRESSION: No evidence of pulmonary emboli. New nonspecific bilateral ground-glass opacities-question edema versus small airway/ small-vessel disease. Nodular opacities within the lingula and right middle and right lower lobes -likely infectious or inflammatory. Unchanged ascending thoracic aortic aneurysm measuring 4.1 cm. Cardiomegaly and coronary artery disease. Electronically Signed   By: Margarette Canada M.D.   On: 03/30/2016 18:17   Ct Angio Chest Pe W Or Wo Contrast  Result Date: 03/19/2016 CLINICAL DATA:  Chest and back pain with weakness EXAM: CT ANGIOGRAPHY CHEST WITH CONTRAST TECHNIQUE: Multidetector CT imaging of the chest was performed using the standard protocol during bolus administration of intravenous contrast. Multiplanar CT image reconstructions and MIPs were obtained to evaluate the vascular anatomy. CONTRAST:  100 mL Isovue 370. COMPARISON:  03/08/2016 FINDINGS: Mediastinum/Lymph Nodes: No significant hilar or mediastinal adenopathy is identified. The thoracic inlet is within normal limits. Cardiovascular: There is mild atherosclerotic change of the thoracic aorta. Dilatation to 4.1 cm of the ascending portion is noted. No significant coronary calcifications are seen. Pulmonary artery is well visualized and demonstrates a normal branching pattern. No filling defects to suggest pulmonary embolism are identified. Lungs/Pleura: The lungs are well aerated bilaterally. Stable atelectatic changes are noted in the lingula. Mild persistent infiltrate is noted in the right lower lobe stable from the prior exam. The nodular changes in the right lung base have improved somewhat in size when compared with  the prior exam. This is likely related to some improvement in the inflammatory change. No new focal infiltrate is seen. Upper abdomen: No acute findings. Musculoskeletal: No chest wall mass or suspicious bone lesions identified. 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 Review of the MIP images confirms the above findings. IMPRESSION: No evidence of pulmonary emboli. Stable dilatation of the ascending aorta. Recommend annual imaging followup by CTA or MRA. This recommendation follows 2010 ACCF/AHA/AATS/ACR/ASA/SCA/SCAI/SIR/STS/SVM Guidelines for the Diagnosis and Management of Patients with Thoracic Aortic Disease. Circulation. 2010; 121: [id number] Persistent changes in the lungs bilaterally with some slight improvement in the nodular changes in the right lower lobe consistent with postinflammatory change Electronically Signed   By: Inez Catalina M.D.   On: 03/19/2016 16:22  Ct Angio Chest Pe W Or Wo Contrast  Result Date: 03/08/2016 CLINICAL DATA:  Shortness of breath, new LEFT lower extremity deep vein thrombosis, on Xarelto. Assess for pulmonary embolism. EXAM: CT ANGIOGRAPHY CHEST WITH CONTRAST TECHNIQUE: Multidetector CT imaging of the chest was performed using the standard protocol during bolus administration of intravenous contrast. Multiplanar CT image reconstructions and MIPs were obtained to evaluate the vascular anatomy.  CONTRAST:  80 cc Isovue 370 COMPARISON:  Chest radiograph March 02, 2010 FINDINGS: PULMONARY ARTERY: Adequate contrast opacification of the pulmonary artery's. Main pulmonary artery is not enlarged. No pulmonary arterial filling defects to the level of the subsegmental branches. MEDIASTINUM: Heart and pericardium are unremarkable, no right heart strain. Trace coronary artery calcifications. Thoracic aorta is normal course and caliber, mild calcific atherosclerosis. No lymphadenopathy by CT size criteria. LUNGS: Tracheobronchial tree is patent, no pneumothorax. Multiple sub solid RIGHT  middle and RIGHT lower lobe pulmonary nodules measuring up to 7 mm, to lesser extent LEFT lower lobe and lingula. Tree-in-bud infiltrates RIGHT lower lobe. No pleural effusion. Bibasilar atelectasis. SOFT TISSUES AND OSSEOUS STRUCTURES: Included view of the abdomen is unremarkable. Visualized soft tissues included osseous structure nonsuspicious, old LEFT rib fractures. ACDF. Approximate T9 hemangioma. Review of the MIP images confirms the above findings. IMPRESSION: No acute pulmonary embolism. Tree-in-bud infiltrates RIGHT lower lobe may be infectious or inflammatory. Bibasilar sub solid pulmonary nodules measure up to 7 mm. Initial follow-up by chest CT without contrast is recommended in 3 months to confirm persistence. This recommendation follows the consensus statement: Recommendations for the Management of Subsolid Pulmonary Nodules Detected at CT: A Statement from the [place] as published in Radiology 2013; 266:304-317. Electronically Signed   By: Elon Alas M.D.   On: 03/08/2016 13:56   Nm Pulmonary Perf And Vent  Result Date: 03/31/2016 CLINICAL DATA:  Progressive shortness of breath. EXAM: NUCLEAR MEDICINE VENTILATION - PERFUSION LUNG SCAN TECHNIQUE: Ventilation images were obtained in multiple projections using inhaled aerosol Tc-59m DTPA. Perfusion images were obtained in multiple projections after intravenous injection of Tc-76m MAA. RADIOPHARMACEUTICALS:  32 mCi Technetium-73m DTPA aerosol inhalation and 4.3 mCi Technetium-71m MAA IV COMPARISON:  Chest radiography 03/31/2016 FINDINGS: Ventilation: No focal ventilation defect. Perfusion: No wedge shaped peripheral perfusion defects to suggest acute pulmonary embolism. Matched ventilation/perfusion patchy areas of decreased uptake are seen in the right lower lobe and throughout the left lung. IMPRESSION: Bilateral patchy matched ventilation profusion defects. Intermediate probability for pulmonary embolus. Electronically Signed    By: Fidela Salisbury M.D.   On: 03/31/2016 18:18    Time Spent in minutes  30   Gina Lyons M.D on 04/01/2016 at 6:44 AM  Between 7am to 7pm - Pager - [phone]  After 7pm go to [email or web address] - password [facility]  [facility] -  Office  [phone]

## 2016-04-01 NOTE — Progress Notes (Signed)
Notified via Vascular Ultrasound Dept, Gina Lyons, that pt is positive for DVT in left lower extremity.  Dr. Daleen Bo has been notified.

## 2016-04-01 NOTE — Care Management Obs Status (Signed)
[place] NOTIFICATION   Patient Details  Name: WYNNETTE DELUCIA MRN: [id number] Date of Birth: [birth year]   Medicare Observation Status Notification Given:  Yes    Royston Bake, RN 04/01/2016, 12:33 PM

## 2016-04-01 NOTE — Progress Notes (Signed)
**  Preliminary report by tech**  Bilateral lower extremity venous duplex completed. No evidence of deep vein or superficial thrombosis involving the right lower extremity. There is evidence of acute thrombosis of the peroneal veins in the mid and distal portions. All other visualized vessels appear patent and compressible. There is no evidence of Baker's cysts bilaterally.   04/01/16 9:34 AM Gina Lyons RVT

## 2016-04-01 NOTE — Progress Notes (Signed)
Eliquis coupon card given to the patient as requested with instructions to activate her coupon card prior to usage. Mindi Slicker [facility] [phone]

## 2016-04-01 NOTE — Progress Notes (Signed)
SATURATION QUALIFICATIONS: (This note is used to comply with regulatory documentation for home oxygen)  Patient Saturations on Room Air at Rest = 100%  Patient Saturations on Room Air while Ambulating = 97%  Patient Saturations on 0 Liters of oxygen while Ambulating = 97%  Please briefly explain why patient needs home oxygen:  Pt does not qualify for home O2.

## 2016-04-01 NOTE — Progress Notes (Signed)
Orders received for pt discharge.  Discharge summary printed and reviewed with pt.  Explained medication regimen, and pt had no further questions at this time.  IV removed and site remains clean, dry, intact.  Telemetry removed.  Pt in stable condition and awaiting transport. 

## 2016-04-02 LAB — HIV ANTIBODY (ROUTINE TESTING W REFLEX): HIV Screen 4th Generation wRfx: NONREACTIVE

## 2016-04-02 LAB — ANTI-DNA ANTIBODY, DOUBLE-STRANDED: ds DNA Ab: 1 IU/mL (ref 0–9)

## 2016-04-03 LAB — CYCLIC CITRUL PEPTIDE ANTIBODY, IGG/IGA: CCP Antibodies IgG/IgA: 3 units (ref 0–19)

## 2016-04-04 LAB — MPO/PR-3 (ANCA) ANTIBODIES: ANCA Proteinase 3: <3.5 U/mL (ref 0.0–3.5)

## 2016-04-04 LAB — GLOMERULAR BASEMENT MEMBRANE ANTIBODIES: GBM AB: 3 U (ref 0–20)

## 2016-04-05 LAB — ANCA TITERS
Atypical P-ANCA titer: <1:20 {titer}
P-ANCA: <1:20 {titer}

## 2016-04-12 ENCOUNTER — Encounter: Payer: Self-pay | Admitting: Adult Health

## 2016-04-12 ENCOUNTER — Ambulatory Visit (INDEPENDENT_AMBULATORY_CARE_PROVIDER_SITE_OTHER): Payer: Medicare Other | Admitting: Adult Health

## 2016-04-12 DIAGNOSIS — R918 Other nonspecific abnormal finding of lung field: Secondary | ICD-10-CM

## 2016-04-12 DIAGNOSIS — I824Z2 Acute embolism and thrombosis of unspecified deep veins of left distal lower extremity: Secondary | ICD-10-CM | POA: Diagnosis not present

## 2016-04-12 DIAGNOSIS — R0600 Dyspnea, unspecified: Secondary | ICD-10-CM | POA: Diagnosis not present

## 2016-04-12 NOTE — Assessment & Plan Note (Addendum)
Dyspnea ? Difficult case with Initial DVT (while taking Estradiol) then developed progressive DOE on Xarelto . CTA Chest was neg x 3 for PE . Subsequent VQ on 03/31/16 the day after a neg CTA chest with intermediate probability for PE.  It would be unlikely that she had an acute PE with 3 neg CTA chest . Echo did not show very high PAP pressure at only 48mmHg. So CTEPH would be an unikely scenario although not totally impossible. None the less she is on Eliquis .  It is most likely the dyspnea is due to the GGO on CT and will need further attenition.  For now she is stable and improving . Will repeat cXR in 4 weeks and HRCT chest in Nov 2017 -will need to be set up on return  Case discussed with Dr. Chase Caller .

## 2016-04-12 NOTE — Patient Instructions (Addendum)
Stop Naproxen .  Continue on Elquis .  Call for any sign of bleeding.  Follow up with Dr. Chase Caller in 4 weeks with chest xray .  Please contact office for sooner follow up if symptoms do not improve or worsen or seek emergency care

## 2016-04-12 NOTE — Progress Notes (Signed)
Subjective:    Patient ID: Gina Lyons, female    DOB: [birth year], 70 y.o.   MRN: [id number]  HPI 70 year old female former smoker, only 3 pack year history, and for pulmonary consult July 2017 for DVT (No PE on CT chest x 3 ) treated with Xarelto.   TEST [name].  Venous Doppler, 03/04/2016 showed DVT in the short segment of left peroneal vein CTA chest , 7/18 2017 negative for pulmonary embolism, bibasilar sub-solid pulmonary nodules measuring up to 7 mm CTA chest 03/19/2016 negative for pulmonary embolism, stable dilation of the ascending aorta CTA chest 03/30/2016 negative for pulmonary emboli, bilateral ground glass opacities, nodular opacities within the lingula, right middle lobe and right lower lobe VQ scan 03/31/16 bilateral patchy matched ventilation/perfusion defects, intermediate  probability for pulmonary embolus. 2D echo 03/31/16>EF 50-55%, gr 1 DD , PAP 45mmHg Pulmonary function test on 03/31/2016 showed a FEV1 at 107%, ratio 77, FVC of 105%, no significant bronchodilator response, DLCO 63%  04/12/2016 post hospital follow-up Patient presents for a post hospital follow-up. Patient was admitted August 9 through 04/01/2016 for progressive shortness of breath. It was diagnosed with a DVT on the left 03/04/2016. She was started on Xarelto. She was on Estradiol and she stopped this. CT chest on July 18 was negative for PE. Patient had 3 weeks of progressive shortness of breath She was admitted on 03/30/2016. CT chest was done that was negative for pulmonary embolism. It did show bilateral groundglass opacities  and nodular opacities within the lingula, right middle lobe and right lower lobe. Was transitioned from [place] too Eliquis and hospitalization. Patient did undergo gentle diuresis. She was also treated with Levaquin for questionable atypical infection. A VQ scan was done that showed bilateral patchy matched ventilation and perfusion defects with intermittent probability for  pulmonary embolism. She says she was told she had a PE.from this .   A 2-D echo showed a normal EF at 50-55% with grade 1 diastolic dysfunctions and pulmonary artery pressures at 25 m of mercury. Pulmonary function test showed normal lung function with an FEV1 at 107%. Patient did have a diffusing defect =DLCO 63%.  Autoimmune and CTD workup was neg (HIV, ANCA, GBM, CCP , dsDNA ab , ANA )  Discharge. Patient is feeling some better but still gets winded with walking. No dyspnea at rest .  She denies cough, hemoptysis , abd pain, n/v./d. She does have some soreness along left LE.  Says prior to admit she was very active with yard work , cycling and house work. No dyspnea at all.  Prior to admit no long travel, injury , surgery . No hx of DVT/PE . No FH of DVT/PE.  No hx of cancer.    Past Medical History:  Diagnosis Date  . Anxiety    "just w/this breathing problem" (03/30/2016)  . Arthritis    "back" (03/30/2016)  . Chronic lower back pain   . DVT (deep venous thrombosis) ([place]) 03/30/2016   LLE  . Family history of adverse reaction to anesthesia    "mom had hard time to wake up"  . History of blood transfusion 1964   "I hemorrhaged after my baby was born"  . Hypertension   . Hypothyroidism   . Migraine    "under control w/RX" (03/30/2016)  . Pneumonia 02/2016   "didn't know I had it" (03/30/2016)  . 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 amLODipine (NORVASC) 5 MG tablet Take 5 mg by mouth at bedtime.    Marland Kitchen apixaban (ELIQUIS) 5 MG TABS tablet Take 1 tablet (5 mg total) by mouth 2 (two) times daily. 60 tablet 1  . Biotin 5 MG CAPS Take 5 mg by mouth daily.    . diazepam (VALIUM) 10 MG tablet Take 10 mg by mouth at bedtime.     . fexofenadine (ALLEGRA) 30 MG tablet Take 30 mg by mouth daily.    Marland Kitchen levothyroxine (SYNTHROID, LEVOTHROID) 112 MCG tablet Take 112 mcg by mouth daily  before breakfast. Brand name only**     No current facility-administered medications on file prior to visit.       Review of Systems Constitutional:   No  weight loss, night sweats,  Fevers, chills, fatigue, or  lassitude.  HEENT:   No headaches,  Difficulty swallowing,  Tooth/dental problems, or  Sore throat,                No sneezing, itching, ear ache, nasal congestion, post nasal drip,   CV:  No chest pain,  Orthopnea, PND, swelling in lower extremities, anasarca, dizziness, palpitations, syncope.   GI  No heartburn, indigestion, abdominal pain, nausea, vomiting, diarrhea, change in bowel habits, loss of appetite, bloody stools.   Resp: + shortness of breath with exertion   No excess mucus, no productive cough,  No non-productive cough,  No coughing up of blood.  No change in color of mucus.  No wheezing.  No chest wall deformity  Skin: no rash or lesions.  GU: no dysuria, change in color of urine, no urgency or frequency.  No flank pain, no hematuria   MS:  No joint pain or swelling.  No decreased range of motion.  No back pain.  Psych:  No change in mood or affect. No depression or anxiety.  No memory loss.         Objective:   Physical Exam  Vitals:   04/12/16 0929  BP: 140/84  Pulse: 64  Temp: 97.6 F (36.4 C)  TempSrc: Oral  SpO2: 97%  Weight: 127 lb (57.6 kg)  Height: 5\' 2"  (1.575 m)   GEN: A/Ox3; pleasant , NAD, well nourished    HEENT:  [place]/AT,  EACs-clear, TMs-wnl, NOSE-clear, THROAT-clear, no lesions, no postnasal drip or exudate noted.   NECK:  Supple w/ fair ROM; no JVD; normal carotid impulses w/o bruits; no thyromegaly or nodules palpated; no lymphadenopathy.    RESP  Clear  P & A; w/o, wheezes/ rales/ or rhonchi. no accessory muscle use, no dullness to percussion  CARD:  RRR, no m/r/g  , no peripheral edema, pulses intact, no cyanosis or clubbing.  GI:   Soft & nt; nml bowel sounds; no organomegaly or masses detected.   Musco: Warm bil, no  deformities or joint swelling noted.   Neuro: alert, no focal deficits noted.    Skin: Warm, no lesions or rashes  Yichen Gilardi NP-C  [facility]  04/12/2016      Assessment & Plan:

## 2016-04-12 NOTE — Assessment & Plan Note (Signed)
Left DVT -?Provoked on Estradiol  She is doing well on Eliquis .  Will discuss length of therapy on return.  Pt education for anticoagulation, advised to avoid NSAIDS.

## 2016-04-12 NOTE — Assessment & Plan Note (Signed)
Abnormal CT chest with GGO bilatterally and scattered nodular opacities. -autoimmune and CTD workup neg.  Will need HRCT set up in Nov 2017 .  cxr on return in 4 weeks.

## 2016-04-12 NOTE — Addendum Note (Signed)
Addended by: Osa Craver on: 04/12/2016 03:31 PM   Modules accepted: Orders

## 2016-05-04 ENCOUNTER — Encounter: Payer: Self-pay | Admitting: Surgery

## 2016-05-04 ENCOUNTER — Institutional Professional Consult (permissible substitution) (INDEPENDENT_AMBULATORY_CARE_PROVIDER_SITE_OTHER): Payer: Medicare Other | Admitting: Surgery

## 2016-05-04 VITALS — BP 137/87 | HR 90 | Resp 20 | Ht 62.0 in | Wt 126.0 lb

## 2016-05-04 DIAGNOSIS — I712 Thoracic aortic aneurysm, without rupture, unspecified: Secondary | ICD-10-CM

## 2016-05-05 ENCOUNTER — Encounter: Payer: Self-pay | Admitting: Surgery

## 2016-05-05 NOTE — Progress Notes (Signed)
Cardiothoracic Surgery Consultation    PCP is Dwan Bolt, MD Referring Provider is Anda Kraft, MD  Chief Complaint  Patient presents with  . Thoracic Aortic Aneurysm    Surgical eval Chest CTA 03/30/16, PFT's, ECHO 03/31/16    HPI:  The patient is a 70 year old woman with a brief prior smoking history who was active and healthy until the last two months. She presented in July with complaints of LLE pain and swelling with a history of superficial phlebitis in the past. Dopplers showed DVT in a short segment of the left peroneal veins. She was started on Xarelto but began complaining of shortness of breath and dizziness. She was seen in the ER on two occasions and had CT scans on 7/18 and 7/29 showing no pulmonary emboli. The scan on 7/18 showed tree-in-bud infiltrates in the RLL and bibasilar sub-solid lung nodules up to 7 mm. The follow up scan on 7/29 showed mild persistent infiltrate in the RLL and improvement in the nodular changes in the right lung base. The ascending aorta was also noted to be dilated to 4.1 cm. She developed progressive shortness of breath and another CT scan was done on 8/9 showing no PE. There were new non-specific bilateral ground-glass opacities that looked like edema or small airway disease. The nodular opacities were unchanged. She had a V/Q scan done on 8/10 which showed bilateral patchy matched ventilation and perfusion defects in the RLL and throughout the left lung that were not typical for PE but read as intermediate probability. She saw hematology and was continued on Xarelto. She was admitted from 8/9-8/11 for progressive shortness of breath. An echo showed a normal EF with grade 1 diastolic dysfunction. PFT's showed a mild diffusion defect of 63% of predicted. Autoimmune and CTD workup was negative. Since going home she says she is about the same and gets short of breath with walking, has a non-productive cough.   Past Medical History:  Diagnosis  Date  . Anxiety    "just w/this breathing problem" (03/30/2016)  . Arthritis    "back" (03/30/2016)  . Chronic lower back pain   . DVT (deep venous thrombosis) ([place]) 03/30/2016   LLE  . Family history of adverse reaction to anesthesia    "mom had hard time to wake up"  . History of blood transfusion 1964   "I hemorrhaged after my baby was born"  . Hypertension   . Hypothyroidism   . Migraine    "under control w/RX" (03/30/2016)  . Pneumonia 02/2016   "didn't know I had it" (03/30/2016)  . 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 BACK SURGERY    . BILATERAL SALPINGOOPHORECTOMY Bilateral   . CARPAL TUNNEL RELEASE Right   . DILATION AND CURETTAGE OF UTERUS    . HEMORRHOID SURGERY    . [place] SURGERY  2013?  Marland Kitchen POSTERIOR CERVICAL FUSION/FORAMINOTOMY    . REFRACTIVE SURGERY Bilateral   . TOTAL THYROIDECTOMY  ?1974  . VAGINAL HYSTERECTOMY      Family History  Problem Relation Age of Onset  . Cancer Sister     brain tumor  . Cancer Brother     unknown ca  . Kidney disease Mother   . COPD Father     smoker    Social History Social History  Substance Use Topics  . Smoking status: Former Smoker    Packs/day: 1.00  Years: 3.00    Types: Cigarettes    Quit date: 08/23/1975  . Smokeless tobacco: Never Used  . Alcohol use No    Current Outpatient Prescriptions  Medication Sig Dispense Refill  . amLODipine (NORVASC) 5 MG tablet Take 5 mg by mouth at bedtime.    Marland Kitchen apixaban (ELIQUIS) 5 MG TABS tablet Take 1 tablet (5 mg total) by mouth 2 (two) times daily. 60 tablet 1  . Biotin 5 MG CAPS Take 5 mg by mouth daily.    . diazepam (VALIUM) 10 MG tablet Take 10 mg by mouth at bedtime.     . fexofenadine (ALLEGRA) 30 MG tablet Take 30 mg by mouth daily.    Marland Kitchen levothyroxine (SYNTHROID, LEVOTHROID) 112 MCG tablet Take 112 mcg by mouth daily before breakfast. Brand name only**    . naproxen (NAPROSYN) 500 MG  tablet Take 1 tablet by mouth daily as needed.  1   No current facility-administered medications for this visit.     Allergies  Allergen Reactions  . Imitrex [Sumatriptan] Other (See Comments)    syncope  . Celebrex [Celecoxib] Other (See Comments)    palpations   . Codeine Itching  . Percocet [Oxycodone-Acetaminophen] Itching    Review of Systems  Constitutional: Positive for activity change. Negative for appetite change, chills, fatigue, fever and unexpected weight change.  HENT: Negative.   Eyes: Negative.   Respiratory: Positive for cough and shortness of breath. Negative for wheezing.   Cardiovascular: Negative for chest pain, palpitations and leg swelling.  Gastrointestinal: Negative.   Endocrine: Negative.   Genitourinary: Negative.   Musculoskeletal: Positive for back pain.  Allergic/Immunologic: Negative.   Neurological: Negative.   Hematological: Negative.   Psychiatric/Behavioral: The patient is nervous/anxious.        About her shortness of breath only    BP 137/87 (BP Location: Right Arm, Patient Position: Sitting, Cuff Size: Normal)   Pulse 90   Resp 20   Ht 5\' 2"  (1.575 m)   Wt 126 lb (57.2 kg)   SpO2 95% Comment: RA  BMI 23.05 kg/m  Physical Exam  Constitutional: She is oriented to person, place, and time. She appears well-developed and well-nourished. No distress.  HENT:  Head: Normocephalic and atraumatic.  Mouth/Throat: Oropharynx is clear and moist.  Eyes: EOM are normal. Pupils are equal, round, and reactive to light.  Neck: Normal range of motion. Neck supple. No JVD present.  Cardiovascular: Normal rate, regular rhythm and intact distal pulses.   No murmur heard. Pulmonary/Chest: Effort normal and breath sounds normal. No respiratory distress. She has no wheezes. She has no rales. She exhibits no tenderness.  Abdominal: Soft. Bowel sounds are normal.  Musculoskeletal: Normal range of motion. She exhibits no edema or tenderness.    Lymphadenopathy:    She has no cervical adenopathy.  Neurological: She is alert and oriented to person, place, and time. She has normal strength. No cranial nerve deficit or sensory deficit.  Skin: Skin is warm and dry.  Psychiatric: She has a normal mood and affect.     Diagnostic Tests:  CLINICAL DATA:  70 year old female with increasing shortness of breath. Patient with known DVT.  EXAM: CT ANGIOGRAPHY CHEST WITH CONTRAST  TECHNIQUE: Multidetector CT imaging of the chest was performed using the standard protocol during bolus administration of intravenous contrast. Multiplanar CT image reconstructions and MIPs were obtained to evaluate the vascular anatomy.  CONTRAST:  100 cc intravenous Isovue 370  COMPARISON:  None.  FINDINGS: Cardiovascular: This  is a technically satisfactory study. No pulmonary emboli are identified. Ascending thoracic aortic aneurysm measuring 4.1 cm in greatest diameter is unchanged. Cardiomegaly again noted as well as mild coronary artery atherosclerotic calcifications.  Mediastinum/Nodes: No mediastinal mass, enlarged lymph nodes or pericardial effusion.  Lungs/Pleura: Nonspecific bilateral ground-glass opacities are now noted and may represent edema source small airway/small-vessel disease. Nodular opacities within the lingula, right middle lobe and right lower lobe again noted without significant change. The largest nodule measures 7 mm in the right lower lobe (image 66). No pleural effusion or pneumothorax identified.  Upper Abdomen: No acute or significant abnormality.  Musculoskeletal: No acute or suspicious abnormalities.  Review of the MIP images confirms the above findings.  IMPRESSION: No evidence of pulmonary emboli.  New nonspecific bilateral ground-glass opacities-question edema versus small airway/ small-vessel disease.  Nodular opacities within the lingula and right middle and right lower lobes -likely  infectious or inflammatory.  Unchanged ascending thoracic aortic aneurysm measuring 4.1 cm.  Cardiomegaly and coronary artery disease.   Electronically Signed   By: Margarette Canada M.D.   On: 03/30/2016 18:17  Impression:  I have personally reviewed and interpreted all of her CT scans of the chest and her echocardiogram. She has a 4.1 cm fusiform ascending aortic aneurysm that does not require any treatment at this time. Her echo shows a trileaflet aortic valve with trivial AI. I reviewed the CT scan with her and her husband and the indications for surgical treatment, follow up recommendations. I discussed the importance of good blood pressure control. All of their questions have been answered. I would plan to follow this in one year with a CTA of the chest. She also has bilateral ground-glass  and nodular pulmonary densities of unclear etiology with progressive shortness of breath over the past couple months. She has had a small left peroneal DVT and multiple studies showing no PE so I don't think this is due to PE. It could be an atypical pneumonia although I would be expecting her to get better by now. Interstitial lung disease would be my biggest concern and she is scheduled for a repeat high resolution CT and pulmonary follow up in the next couple months. Pulmonary medicine will ultimately decide on a course of action and I will be happy to do a VATS lung biopsy if the need arises.   Plan:  She will continue pulmonary medicine follow up of her lung disease and I will see her back in one year with a CTA of the chest to follow up on her ascending aneurysm.   I spent 45 minutes performing this consultation and > 50% of this time was spent face to face counseling and coordinating the care of this patient's ascending aortic aneurysm and pulmonary infiltrates. Gaye Pollack, MD [facility] [phone]

## 2016-05-13 ENCOUNTER — Ambulatory Visit (INDEPENDENT_AMBULATORY_CARE_PROVIDER_SITE_OTHER)
Admission: RE | Admit: 2016-05-13 | Discharge: 2016-05-13 | Disposition: A | Discharge status: Home / Self Care | Payer: Medicare Other | Source: Ambulatory Visit | Attending: Internal Medicine | Admitting: Internal Medicine

## 2016-05-13 ENCOUNTER — Other Ambulatory Visit: Payer: Medicare Other

## 2016-05-13 ENCOUNTER — Telehealth (HOSPITAL_COMMUNITY): Payer: Self-pay | Admitting: Vascular Surgery

## 2016-05-13 ENCOUNTER — Ambulatory Visit (INDEPENDENT_AMBULATORY_CARE_PROVIDER_SITE_OTHER): Payer: Medicare Other | Admitting: Internal Medicine

## 2016-05-13 ENCOUNTER — Encounter: Payer: Self-pay | Admitting: Internal Medicine

## 2016-05-13 VITALS — BP 124/78 | HR 71 | Ht 62.0 in | Wt 129.0 lb

## 2016-05-13 DIAGNOSIS — Z86718 Personal history of other venous thrombosis and embolism: Secondary | ICD-10-CM

## 2016-05-13 DIAGNOSIS — R918 Other nonspecific abnormal finding of lung field: Secondary | ICD-10-CM

## 2016-05-13 DIAGNOSIS — R0600 Dyspnea, unspecified: Secondary | ICD-10-CM | POA: Diagnosis not present

## 2016-05-13 LAB — NITRIC OXIDE: NITRIC OXIDE: 23

## 2016-05-13 NOTE — Progress Notes (Signed)
Subjective:     Patient ID: Gina Lyons, female   DOB: 09/16/1945, 70 y.o.   MRN: [id number]  HPI   70 year old female former smoker, only 3 pack year history, and for pulmonary consult July 2017 for DVT (No PE on CT chest x 3 ) treated with Xarelto.   TEST [name].  Venous Doppler, 03/04/2016 showed DVT in the short segment of left peroneal vein CTA chest , 7/18 2017 negative for pulmonary embolism, bibasilar sub-solid pulmonary nodules measuring up to 7 mm CTA chest 03/19/2016 negative for pulmonary embolism, stable dilation of the ascending aorta CTA chest 03/30/2016 negative for pulmonary emboli, bilateral ground glass opacities, nodular opacities within the lingula, right middle lobe and right lower lobe VQ scan 03/31/16 bilateral patchy matched ventilation/perfusion defects, intermediate  probability for pulmonary embolus. 2D echo 03/31/16>EF 50-55%, gr 1 DD , PAP 67mmHg Pulmonary function test on 03/31/2016 showed a FEV1 at 107%, ratio 77, FVC of 105%, no significant bronchodilator response, DLCO 63%  04/12/2016 post hospital follow-up Patient presents for a post hospital follow-up. It was diagnosed with a DVT on the left 03/04/2016. She was started on Xarelto. She was on Estradiol and she stopped this. CT chest on July 18 was negative for PE. Patient had 3 weeks of progressive shortness of breath She was admitted on 03/30/2016. Patient was admitted August 9 through 04/01/2016 for progressive shortness of breath. CT chest was done that was negative for pulmonary embolism. It did show bilateral groundglass opacities  and nodular opacities within the lingula, right middle lobe and right lower lobe. Was transitioned from [place] too Eliquis and hospitalization. Patient did undergo gentle diuresis. She was also treated with Levaquin for questionable atypical infection. A VQ scan was done that showed bilateral patchy matched ventilation and perfusion defects with intermittent probability for  pulmonary embolism. She says she was told she had a PE.from this .   A 2-D echo showed a normal EF at 50-55% with grade 1 diastolic dysfunctions and pulmonary artery pressures at 25 m of mercury. Pulmonary function test showed normal lung function with an FEV1 at 107%. Patient did have a diffusing defect =DLCO 63%.  Autoimmune and CTD workup was neg (HIV, ANCA, GBM, CCP , dsDNA ab , ANA )  Discharge. Patient is feeling some better but still gets winded with walking. No dyspnea at rest .  She denies cough, hemoptysis , abd pain, n/v./d. She does have some soreness along left LE.  Says prior to admit she was very active with yard work , cycling and house work. No dyspnea at all.  Prior to admit no long travel, injury , surgery . No hx of DVT/PE . No FH of DVT/PE.  No hx of cancer.     OV 05/13/2016  Chief Complaint  Patient presents with  . Follow-up    Pt here after CXR and seeing TP on 8.22.17. Pt states her SOB has not improved since also OV. PT c/o DOE, intermittent dry cough, chest tightness when SOB and resolves when she lays down. Pt states she had mild left foot swelling on 9.21.17, this has resolved. Pt denies f/c/s.     Gina Lyons 70 year old female with very limited remote smoking. In July 2017 she was admitted with left-sided DVT and was started on Xarelto. Sometime after that according to her history she started developing shortness of breath which has persisted to this day despite changes with Xarelto to es.E;liquis. This dyspnea is pervasive. It is moderate to severe  in intensity. It is present for exertion and relieved by rest. Class II-III levels of activity makes her dyspneic. There is no orthopnea paroxysmal nocturnal dyspnea. She had 3 CT scans of the chest in July 2017 that ruled out pulmonary embolism but in August 2017 she did have some new groundglass opacities. August 2017 VQ scan was of intermediate probability. Only function does show reduction in diffusion capacity  suggesting the groundglass opacities might be real. She did have autoimmune workup in August 2017 along with complement and vascular this workup that was normal. She presents today with her husband. She is really frustrated by her dyspnea. Chest x-ray personally visualized shows some nonspecific scarring. She is not able to do her household activities.  Distended also to me that she has a dry cough of mild intensity for the last 6 months. Cough preceded onset of dyspnea and DVT. At last visit they specifically denied this cough. This no associated wheezing or orthopnea paroxysmal nocturnal dyspnea or chills.    Exhaled nitric oxide testing today in the office 05/13/2016: 23 ppb and normal   Walking desaturation test on 05/13/2016 185 feet x 3 laps:  gotdyspneic and had dull achy pain right desaturated tobreast did NOT desaturate. Rest pulse ox was 100%, final pulse ox was 100% was the final pulse ox w heart rate response was at rest78. HR response  to 94/min at peak exertion.    Noted SCL 70 not done (no dysphagia)  Noted no d-dimer of record  Dg Chest 2 View  Result Date: 05/13/2016 CLINICAL DATA:  Follow-up pulmonary infiltrates. EXAM: CHEST  2 VIEW COMPARISON:  04/07/2016 and 03/31/2016 FINDINGS: Mild hyperinflation is stable. Stable linear densities in the anterior left lung. No significant airspace disease or consolidation. Heart and mediastinum are within normal limits and stable. Surgical plate in the lower cervical spine. Trachea is midline. No large pleural effusions. Old left seventh rib fracture. IMPRESSION: Stable linear densities in the left lung are probably related to scarring. No acute cardiopulmonary disease. Electronically Signed   By: Markus Daft M.D.   On: 05/13/2016 14:25       has a past medical history of Anxiety; Arthritis; Chronic lower back pain; DVT (deep venous thrombosis) ([place]) (03/30/2016); Family history of adverse reaction to anesthesia; History of blood  transfusion (1964); Hypertension; Hypothyroidism; Migraine; Pneumonia (02/2016); and Tumor, thyroid.   reports that she quit smoking about 40 years ago. Her smoking use included Cigarettes. She has a 3.00 pack-year smoking history. She has never used smokeless tobacco.  Past Surgical History:  Procedure Laterality Date  . BACK SURGERY    . BILATERAL SALPINGOOPHORECTOMY Bilateral   . CARPAL TUNNEL RELEASE Right   . DILATION AND CURETTAGE OF UTERUS    . HEMORRHOID SURGERY    . [place] SURGERY  2013?  Marland Kitchen POSTERIOR CERVICAL FUSION/FORAMINOTOMY    . REFRACTIVE SURGERY Bilateral   . TOTAL THYROIDECTOMY  ?1974  . VAGINAL HYSTERECTOMY      Allergies  Allergen Reactions  . Imitrex [Sumatriptan] Other (See Comments)    syncope  . Celebrex [Celecoxib] Other (See Comments)    palpations   . Codeine Itching  . Percocet [Oxycodone-Acetaminophen] Itching    Immunization History  Administered Date(s) Administered  . Influenza, High Dose Seasonal PF 04/12/2016  . Pneumococcal Polysaccharide-23 03/31/2016    Family History  Problem Relation Age of Onset  . Cancer Sister     brain tumor  . Cancer Brother     unknown ca  .  Kidney disease Mother   . COPD Father     smoker     Current Outpatient Prescriptions:  .  amLODipine (NORVASC) 5 MG tablet, Take 5 mg by mouth at bedtime., Disp: , Rfl:  .  apixaban (ELIQUIS) 5 MG TABS tablet, Take 1 tablet (5 mg total) by mouth 2 (two) times daily., Disp: 60 tablet, Rfl: 1 .  Biotin 5 MG CAPS, Take 5 mg by mouth daily., Disp: , Rfl:  .  diazepam (VALIUM) 10 MG tablet, Take 10 mg by mouth at bedtime. , Disp: , Rfl:  .  fexofenadine (ALLEGRA) 30 MG tablet, Take 30 mg by mouth daily., Disp: , Rfl:  .  levothyroxine (SYNTHROID, LEVOTHROID) 112 MCG tablet, Take 112 mcg by mouth daily before breakfast. Brand name only**, Disp: , Rfl:  .  naproxen (NAPROSYN) 500 MG tablet, Take 1 tablet by mouth daily as needed., Disp: , Rfl: 1   Review of  Systems     Objective:   Physical Exam  Constitutional: She is oriented to person, place, and time. She appears well-developed and well-nourished. No distress.  HENT:  Head: Normocephalic and atraumatic.  Right Ear: External ear normal.  Left Ear: External ear normal.  Mouth/Throat: Oropharynx is clear and moist. No oropharyngeal exudate.  Eyes: Conjunctivae and EOM are normal. Pupils are equal, round, and reactive to light. Right eye exhibits no discharge. Left eye exhibits no discharge. No scleral icterus.  Neck: Normal range of motion. Neck supple. No JVD present. No tracheal deviation present. No thyromegaly present.  Cardiovascular: Normal rate, regular rhythm, normal heart sounds and intact distal pulses.  Exam reveals no gallop and no friction rub.   No murmur heard. Pulmonary/Chest: Effort normal and breath sounds normal. No respiratory distress. She has no wheezes. She has no rales. She exhibits no tenderness.  Abdominal: Soft. Bowel sounds are normal. She exhibits no distension and no mass. There is no tenderness. There is no rebound and no guarding.  Musculoskeletal: Normal range of motion. She exhibits no edema or tenderness.  Lymphadenopathy:    She has no cervical adenopathy.  Neurological: She is alert and oriented to person, place, and time. She has normal reflexes. No cranial nerve deficit. She exhibits normal muscle tone. Coordination normal.  Skin: Skin is warm and dry. No rash noted. She is not diaphoretic. No erythema. No pallor.  wrinked skin on face? More than stated age  Psychiatric: She has a normal mood and affect. Her behavior is normal. Judgment and thought content normal.  Vitals reviewed.  Vitals:   05/13/16 1426  BP: 124/78  Pulse: 71  SpO2: 98%  Weight: 129 lb (58.5 kg)  Height: 5\' 2"  (1.575 m)        Assessment:       ICD-9-CM ICD-10-CM   1. Dyspnea 786.09 R06.00   2. Pulmonary infiltrate 793.19 R91.8 DG Chest 2 View  3. History of DVT of  lower extremity V12.51 Z86.718        Plan:     Dyspnea remains unexplained. The closest I can get to in explaining dyspnea is groundglass opacities with the isolated reduction in diffusion capacity of August 2017. However the dyspnea is out of proportion to this. Typically after DVT dyspnea is due to pulmonary embolism and if it is on resolved with anticoagulation it is due to pulmonary hypertension but both these appeared to be ruled out. I think the best option right now is to get a scleroderma 70 autoimmune antibody because  of her wrinkle skin. In addition we'll check d-dimer to look at current, thrombogenicity. We'll also get a pulmonary stress test placement out of proportion dyspnea. She and her husband are agreeable with the plan and verbalized understanding   > 50% of this > 25 min visit spent in face to face counseling or coordination of care   Dr. Brand Males, M.D., [facility].C.P Pulmonary and Critical Care Medicine Staff Physician [place] [facility] Pager: [phone], If no answer or between  15:00h - 7:00h: call [phone]  05/13/2016 3:10 PM

## 2016-05-13 NOTE — Patient Instructions (Addendum)
ICD-9-CM ICD-10-CM   1. Dyspnea 786.09 R06.00   2. Pulmonary infiltrate 793.19 R91.8 DG Chest 2 View  3. History of DVT of lower extremity V12.51 Z86.718    Do d-dimer and blood test for scl-70 for slceroderma Do CPST bike stress test  Followup - next several weeks but after bike stress test

## 2016-05-13 NOTE — Telephone Encounter (Signed)
Left pt message to make cpx appt

## 2016-05-14 LAB — D-DIMER, QUANTITATIVE (NOT AT ARMC): D DIMER QUANT: 0.36 ug{FEU}/mL (ref <0.50)

## 2016-05-16 LAB — ANTI-SCLERODERMA ANTIBODY: Scleroderma (Scl-70) (ENA) Antibody, IgG: <1

## 2016-05-16 NOTE — Progress Notes (Signed)
lmtcb for pt.  

## 2016-05-17 ENCOUNTER — Telehealth: Payer: Self-pay | Admitting: Internal Medicine

## 2016-05-17 NOTE — Telephone Encounter (Signed)
Spoke with pt and gave lab results. Scheduled ROV for post CPST. Nothing further needed.

## 2016-05-19 NOTE — Progress Notes (Signed)
Results already discussed with pt per 05/17/16 phone note. Will sign off.

## 2016-05-25 ENCOUNTER — Ambulatory Visit (HOSPITAL_COMMUNITY): Payer: Medicare Other | Attending: Cardiology

## 2016-05-25 DIAGNOSIS — R918 Other nonspecific abnormal finding of lung field: Secondary | ICD-10-CM

## 2016-05-25 DIAGNOSIS — R0600 Dyspnea, unspecified: Secondary | ICD-10-CM

## 2016-05-25 DIAGNOSIS — Z86718 Personal history of other venous thrombosis and embolism: Secondary | ICD-10-CM

## 2016-05-30 DIAGNOSIS — R0600 Dyspnea, unspecified: Secondary | ICD-10-CM | POA: Diagnosis not present

## 2016-06-01 ENCOUNTER — Ambulatory Visit (INDEPENDENT_AMBULATORY_CARE_PROVIDER_SITE_OTHER): Payer: Medicare Other | Admitting: Internal Medicine

## 2016-06-01 ENCOUNTER — Encounter: Payer: Self-pay | Admitting: Internal Medicine

## 2016-06-01 ENCOUNTER — Other Ambulatory Visit: Payer: Self-pay | Admitting: Internal Medicine

## 2016-06-01 VITALS — BP 116/78 | HR 77 | Ht 62.0 in | Wt 129.0 lb

## 2016-06-01 DIAGNOSIS — R942 Abnormal results of pulmonary function studies: Secondary | ICD-10-CM | POA: Diagnosis not present

## 2016-06-01 DIAGNOSIS — R0689 Other abnormalities of breathing: Secondary | ICD-10-CM

## 2016-06-01 DIAGNOSIS — R0600 Dyspnea, unspecified: Secondary | ICD-10-CM

## 2016-06-01 DIAGNOSIS — R232 Flushing: Secondary | ICD-10-CM

## 2016-06-01 DIAGNOSIS — R918 Other nonspecific abnormal finding of lung field: Secondary | ICD-10-CM | POA: Insufficient documentation

## 2016-06-01 DIAGNOSIS — Z86718 Personal history of other venous thrombosis and embolism: Secondary | ICD-10-CM | POA: Diagnosis not present

## 2016-06-01 DIAGNOSIS — I519 Heart disease, unspecified: Secondary | ICD-10-CM | POA: Insufficient documentation

## 2016-06-01 MED ORDER — FUROSEMIDE 20 MG PO TABS: 20.0000 mg | ORAL_TABLET | Freq: Every day | ORAL | 3 refills | Status: DC

## 2016-06-01 MED ORDER — POTASSIUM CHLORIDE CRYS ER 10 MEQ PO TBCR: 10.0000 meq | EXTENDED_RELEASE_TABLET | Freq: Every day | ORAL | 3 refills | Status: DC

## 2016-06-01 NOTE — Patient Instructions (Addendum)
Dyspnea and respiratory abnormality  - likely due to mild abnormalities of CT chest that are not clot related and diastolic dysfunction - refer pulmonary rehab  History of DVT of lower extremity  - related to estriogen related - d-dimer august 2017 normal - recommend 3 to 6 month eliquis - will do refill - monitor for bleeding   Diastolic dysfunction, left ventricle - seen on echo august 2017 and bike stress test Oct 2017 - start lasix 20mg  daily with 27meq kcl daily  - refer cardiology - check bmet in 1 week  Abnormal CT scan, lung - ground glass opacity  Aug 2017 Abnormal PFT - aug 2017 with reduced diffusion - do HRCT supine and prone feb 2018  Hot flashes since blood clot - July 2017  - probably related to coming off estrogen  - talk to PCP Gina Bolt, MD about this  [place] - 2-3 months to report progress with dyspnea or sooner if needed

## 2016-06-01 NOTE — Progress Notes (Signed)
Subjective:     Patient ID: Gina Lyons, female   DOB: [birth year], 70 y.o.   MRN: [id number]  HPI     70 year old female former smoker, only 3 pack year history, and for pulmonary consult July 2017 for DVT (No PE on CT chest x 3 ) treated with Xarelto.   TEST [name].  Venous Doppler, 03/04/2016 showed DVT in the short segment of left peroneal vein CTA chest , 7/18 2017 negative for pulmonary embolism, bibasilar sub-solid pulmonary nodules measuring up to 7 mm CTA chest 03/19/2016 negative for pulmonary embolism, stable dilation of the ascending aorta CTA chest 03/30/2016 negative for pulmonary emboli, bilateral ground glass opacities, nodular opacities within the lingula, right middle lobe and right lower lobe VQ scan 03/31/16 bilateral patchy matched ventilation/perfusion defects, intermediate  probability for pulmonary embolus. 2D echo 03/31/16>EF 50-55%, gr 1 DD , PAP 61mmHg Pulmonary function test on 03/31/2016 showed a FEV1 at 107%, ratio 77, FVC of 105%, no significant bronchodilator response, DLCO 63%  04/12/2016 post hospital follow-up Patient presents for a post hospital follow-up. It was diagnosed with a DVT on the left 03/04/2016. She was started on Xarelto. She was on Estradiol and she stopped this. CT chest on July 18 was negative for PE. Patient had 3 weeks of progressive shortness of breath She was admitted on 03/30/2016. Patient was admitted August 9 through 04/01/2016 for progressive shortness of breath. CT chest was done that was negative for pulmonary embolism. It did show bilateral groundglass opacities  and nodular opacities within the lingula, right middle lobe and right lower lobe. Was transitioned from [place] too Eliquis and hospitalization. Patient did undergo gentle diuresis. She was also treated with Levaquin for questionable atypical infection. A VQ scan was done that showed bilateral patchy matched ventilation and perfusion defects with intermittent probability for  pulmonary embolism. She says she was told she had a PE.from this .   A 2-D echo showed a normal EF at 50-55% with grade 1 diastolic dysfunctions and pulmonary artery pressures at 25 m of mercury. Pulmonary function test showed normal lung function with an FEV1 at 107%. Patient did have a diffusing defect =DLCO 63%.  Autoimmune and CTD workup was neg (HIV, ANCA, GBM, CCP , dsDNA ab , ANA )  Discharge. Patient is feeling some better but still gets winded with walking. No dyspnea at rest .  She denies cough, hemoptysis , abd pain, n/v./d. She does have some soreness along left LE.  Says prior to admit she was very active with yard work , cycling and house work. No dyspnea at all.  Prior to admit no long travel, injury , surgery . No hx of DVT/PE . No FH of DVT/PE.  No hx of cancer.     OV 05/13/2016  Chief Complaint  Patient presents with  . Follow-up    Pt here after CXR and seeing TP on 8.22.17. Pt states her SOB has not improved since also OV. PT c/o DOE, intermittent dry cough, chest tightness when SOB and resolves when she lays down. Pt states she had mild left foot swelling on 9.21.17, this has resolved. Pt denies f/c/s.     Gina Lyons 70 year old female with very limited remote smoking. In July 2017 she was admitted with left-sided DVT and was started on Xarelto. Sometime after that according to her history she started developing shortness of breath which has persisted to this day despite changes with Xarelto to es.E;liquis. This dyspnea is pervasive. It is moderate  to severe in intensity. It is present for exertion and relieved by rest. Class II-III levels of activity makes her dyspneic. There is no orthopnea paroxysmal nocturnal dyspnea. She had 3 CT scans of the chest in July 2017 that ruled out pulmonary embolism but in August 2017 she did have some new groundglass opacities. August 2017 VQ scan was of intermediate probability. Only function does show reduction in diffusion capacity  suggesting the groundglass opacities might be real. She did have autoimmune workup in August 2017 along with complement and vascular this workup that was normal. She presents today with her husband. She is really frustrated by her dyspnea. Chest x-ray personally visualized shows some nonspecific scarring. She is not able to do her household activities.  she has a dry cough of mild intensity for the last 6 months. Cough preceded onset of dyspnea and DVT. At last visit they specifically denied this cough. This no associated wheezing or orthopnea paroxysmal nocturnal dyspnea or chills.    Exhaled nitric oxide testing today in the office 05/13/2016: 23 ppb and normal   Walking desaturation test on 05/13/2016 185 feet x 3 laps:   did NOT desaturate. Rest pulse ox was 100%, final pulse ox was 100% was the final pulse ox w heart rate response was at rest78. HR response  to 94/min at peak exertion.    Noted SCL 70 not done (no dysphagia)  Noted no d-dimer of record  Dg Chest 2 View  Result Date: 05/13/2016 CLINICAL DATA:  Follow-up pulmonary infiltrates. EXAM: CHEST  2 VIEW COMPARISON:  04/07/2016 and 03/31/2016 FINDINGS: Mild hyperinflation is stable. Stable linear densities in the anterior left lung. No significant airspace disease or consolidation. Heart and mediastinum are within normal limits and stable. Surgical plate in the lower cervical spine. Trachea is midline. No large pleural effusions. Old left seventh rib fracture. IMPRESSION: Stable linear densities in the left lung are probably related to scarring. No acute cardiopulmonary disease. Electronically Signed   By: Markus Daft M.D.   On: 05/13/2016 14:25       OV 06/01/2016  Chief Complaint  Patient presents with  . Follow-up    Pt here after CPST. Pt states her SOB has slightly improved since last OV. Pt c/o dry cough and occassional chest tightness - under breasts after eating dinner in evening. Pt denies chest congestion and f/c/s.      Dyspnea evaluation. Here for review of pulmonary stress test. She underwent pulmonary stress test 05/26/2016. She gave an excellent effort. She had normal VO2 max and anaerobic threshold. Nevertheless there is a flattening of her O2 pulse curve that is significant. This is associated with tachypnea and dead space ventilation and approaching ventilatory limits. Pulse ox did not go down. In summary this is a combination of cardiovascular and pulmonary limitations.   She is reporting weight gain since a deep vein thrombosis. So for pulmonary embolism has never been confirmed. In fact the CT chest in October 2017 only shows groundglass opacities. Echocardiogram shows diastolic dysfunction. In addition after coming off her estrogen which she was taking for many years [in July 2017 after DVT] she is reporting significant hot flashes which are frustrating her. She continues to suffer from dyspnea and is frustrated by this as well.      has a past medical history of Anxiety; Arthritis; Chronic lower back pain; DVT (deep venous thrombosis) ([place]) (03/30/2016); Family history of adverse reaction to anesthesia; History of blood transfusion (1964); Hypertension; Hypothyroidism; Migraine; Pneumonia (02/2016);  and Tumor, thyroid.   reports that she quit smoking about 40 years ago. Her smoking use included Cigarettes. She has a 3.00 pack-year smoking history. She has never used smokeless tobacco.  Past Surgical History:  Procedure Laterality Date  . BACK SURGERY    . BILATERAL SALPINGOOPHORECTOMY Bilateral   . CARPAL TUNNEL RELEASE Right   . DILATION AND CURETTAGE OF UTERUS    . HEMORRHOID SURGERY    . [place] SURGERY  2013?  Marland Kitchen POSTERIOR CERVICAL FUSION/FORAMINOTOMY    . REFRACTIVE SURGERY Bilateral   . TOTAL THYROIDECTOMY  ?1974  . VAGINAL HYSTERECTOMY      Allergies  Allergen Reactions  . Imitrex [Sumatriptan] Other (See Comments)    syncope  . Celebrex [Celecoxib] Other (See Comments)     palpations   . Codeine Itching  . Percocet [Oxycodone-Acetaminophen] Itching    Immunization History  Administered Date(s) Administered  . Influenza, High Dose Seasonal PF 04/12/2016  . Pneumococcal Polysaccharide-23 03/31/2016    Family History  Problem Relation Age of Onset  . Cancer Sister     brain tumor  . Cancer Brother     unknown ca  . Kidney disease Mother   . COPD Father     smoker     Current Outpatient Prescriptions:  .  amLODipine (NORVASC) 5 MG tablet, Take 5 mg by mouth at bedtime., Disp: , Rfl:  .  apixaban (ELIQUIS) 5 MG TABS tablet, Take 1 tablet (5 mg total) by mouth 2 (two) times daily., Disp: 60 tablet, Rfl: 1 .  Biotin 5 MG CAPS, Take 5 mg by mouth daily., Disp: , Rfl:  .  diazepam (VALIUM) 10 MG tablet, Take 10 mg by mouth at bedtime. , Disp: , Rfl:  .  fexofenadine (ALLEGRA) 30 MG tablet, Take 30 mg by mouth daily., Disp: , Rfl:  .  levothyroxine (SYNTHROID, LEVOTHROID) 112 MCG tablet, Take 112 mcg by mouth daily before breakfast. Brand name only**, Disp: , Rfl:  .  naproxen (NAPROSYN) 500 MG tablet, Take 1 tablet by mouth daily as needed., Disp: , Rfl: 1    Review of Systems     Objective:   Physical Exam Vitals:   06/01/16 0942  BP: 116/78  Pulse: 77  SpO2: 96%  Weight: 129 lb (58.5 kg)  Height: 5\' 2"  (1.575 m)     Body mass index is 23.59 kg/m.  discusion only visit    Assessment:       ICD-9-CM ICD-10-CM   1. Dyspnea and respiratory abnormality 786.09 R06.00     R06.89   2. History of DVT of lower extremity V12.51 Z86.718   3. Abnormal PFT 794.2 R94.2   4. Diastolic dysfunction, left ventricle 429.9 I51.9   5. Abnormal CT scan, lung 793.19 R91.8   6. Hot flashes 782.62 R23.2        Plan:     Dyspnea and respiratory abnormality  - likely due to mild abnormalities of CT chest that are not clot related and diastolic dysfunction - refer pulmonary rehabTo address any deconditioning component - see workup below in each  section  Diastolic dysfunction, left ventricle - seen on echo august 2017 and bike stress test Oct 2017 - start lasix 20mg  daily with 30meq kcl daily  - refer cardiology - check bmet in 1 week  Abnormal CT scan, lung - ground glass opacity  Aug 2017 Abnormal PFT - aug 2017 with reduced diffusion - do HRCT supine and prone feb 2018Although the mild groundglass  opacities could easily be due to diastolic dysfunction - It will be interesting to see what happens with these infiltrates with the onset of Lasix therapy and cardiology referral   History of DVT of lower extremity  - related to estriogen related - d-dimer august 2017 normal - recommend 3 to 6 month eliquis - will do refill - monitor for bleeding    Hot flashes since blood clot - July 2017  - probably related to coming off estrogen  - talk to PCP Dwan Bolt, MD about this  [place] - 2-3 months to report progress with dyspnea or sooner if needed    > 50% of this > 25 min visit spent in face to face counseling or coordination of care    Dr. Brand Males, M.D., [facility].C.P Pulmonary and Critical Care Medicine Staff Physician [place] [facility] Pager: [phone], If no answer or between  15:00h - 7:00h: call [phone]  06/01/2016 10:22 AM

## 2016-06-09 ENCOUNTER — Other Ambulatory Visit (INDEPENDENT_AMBULATORY_CARE_PROVIDER_SITE_OTHER): Payer: Medicare Other

## 2016-06-09 DIAGNOSIS — I519 Heart disease, unspecified: Secondary | ICD-10-CM | POA: Diagnosis not present

## 2016-06-09 LAB — BASIC METABOLIC PANEL
BUN: 15 mg/dL (ref 6–23)
CHLORIDE: 106 meq/L (ref 96–112)
CO2: 30 meq/L (ref 19–32)
CREATININE: 0.92 mg/dL (ref 0.40–1.20)
Calcium: 9.7 mg/dL (ref 8.4–10.5)
GFR: 63.65 mL/min (ref >60.00)
GLUCOSE: 90 mg/dL (ref 70–99)
Potassium: 5.1 mEq/L (ref 3.5–5.1)
Sodium: 143 mEq/L (ref 135–145)

## 2016-06-14 ENCOUNTER — Ambulatory Visit (INDEPENDENT_AMBULATORY_CARE_PROVIDER_SITE_OTHER): Payer: Medicare Other | Admitting: Nurse Practitioner

## 2016-06-14 ENCOUNTER — Encounter: Payer: Self-pay | Admitting: Nurse Practitioner

## 2016-06-14 VITALS — BP 130/76 | HR 87 | Ht 62.0 in | Wt 130.0 lb

## 2016-06-14 DIAGNOSIS — O2230 Deep phlebothrombosis in pregnancy, unspecified trimester: Secondary | ICD-10-CM

## 2016-06-14 DIAGNOSIS — R0789 Other chest pain: Secondary | ICD-10-CM | POA: Diagnosis not present

## 2016-06-14 DIAGNOSIS — R0609 Other forms of dyspnea: Secondary | ICD-10-CM

## 2016-06-14 DIAGNOSIS — I10 Essential (primary) hypertension: Secondary | ICD-10-CM

## 2016-06-14 DIAGNOSIS — I82409 Acute embolism and thrombosis of unspecified deep veins of unspecified lower extremity: Secondary | ICD-10-CM

## 2016-06-14 DIAGNOSIS — I519 Heart disease, unspecified: Secondary | ICD-10-CM | POA: Diagnosis not present

## 2016-06-14 NOTE — Patient Instructions (Signed)
Medication Instructions:  STOP POTASSIUM 10MEQ  Labwork: BMP AT [facility] IN 1 WEEK  Testing/Procedures: Your physician has requested that you have a lexiscan myoview. For further information please visit [email or web address]. Please follow instruction sheet, as given.  Follow-Up: Your physician recommends that you schedule a follow-up appointment in: [place]   If you need a refill on your cardiac medications before your next appointment, please call your pharmacy.

## 2016-06-14 NOTE — Progress Notes (Signed)
Office Visit    Patient Name: Gina Lyons Date of Encounter: 06/14/2016  Primary Care Provider:  Dwan Bolt, MD Primary Cardiologist:  C. Hilty, MD   Chief Complaint    70 year old female with diagnosis of DVT over the summer of 2017 and subsequent dyspnea on exertion with significant evaluation, who presents for follow-up.  Past Medical History    Past Medical History:  Diagnosis Date  . Anxiety    "just w/this breathing problem" (03/30/2016)  . Arthritis    "back" (03/30/2016)  . Chronic lower back pain   . Diastolic dysfunction    a. 03/2016 Echo: EF 50-55%, no rwma, GR1 DD, mild AI, triv MR/TR, PASP 89mmHg.  Marland Kitchen DVT (deep venous thrombosis) ([place])    a. 02/2016 LLE DVT-->Xarelto, switched to Eliquis 03/2016 b/c pt felt that xarelto may have been causing shortness of breath.  . Dyspnea on exertion    a. 03/08/2016, 03/19/2016, 03/30/2016 CTA Chest: No PE;  b. 03/2016 V:Q Scan: Intermediate probability for PE;  c. 05/2016 Pulmonary Stress Test: Excellent effort, nl VO2 max and anaerobic threshold, flattening of O2 pulse curve associated with tachypnea and dead space ventilation and approaching ventilatory limits. No desat. Likely combined cardiac and pulmonary limitations.  . Family history of adverse reaction to anesthesia    "mom had hard time to wake up"  . History of blood transfusion 1964   "I hemorrhaged after my baby was born"  . Hypertension   . Hypothyroidism   . Migraine    "under control w/RX" (03/30/2016)  . Pneumonia 02/2016   "didn't know I had it" (03/30/2016)  . 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 BACK SURGERY    . BILATERAL SALPINGOOPHORECTOMY Bilateral   . CARPAL TUNNEL RELEASE Right   . DILATION AND CURETTAGE OF UTERUS    . HEMORRHOID SURGERY    . [place] SURGERY  2013?  Marland Kitchen POSTERIOR CERVICAL FUSION/FORAMINOTOMY    . REFRACTIVE SURGERY Bilateral   . TOTAL THYROIDECTOMY   ?1974  . VAGINAL HYSTERECTOMY      Allergies  Allergies  Allergen Reactions  . Imitrex [Sumatriptan] Other (See Comments)    syncope  . Celebrex [Celecoxib] Other (See Comments)    palpations   . Codeine Itching  . Percocet [Oxycodone-Acetaminophen] Itching    History of Present Illness    70 year old female with the above complex past medical history. In July 2017, she was initially diagnosed with a superficial thrombosis involving the left lower extremity and associated with ankle and foot swelling. This was later determined to be a DVT. She was initially treated with Xarelto. Shortly after starting Xarelto, she noted dyspnea on exertion. She had 2 CT scans of her chest in July, both of which were negative for pulmonary embolism. She was referred to cardiology and was seen by Dr. Debara Pickett on August 9. She was significantly dyspneic and tachypneic in the office and was referred to the emergency department. In the emergency room, ABG revealed respiratory alkalosis and hypoxia. Repeat CT angios was negative for PE. Lower extremity ultrasound showed left lower extremity DVT. Because dyspnea seemed to start after initiation of Xarelto, she was switched to eliquis. VQ scan was performed and was felt to be intermediate probability. There is also question of atypical infection and she was treated with levofloxacin. Finally, echocardiogram was performed and showed normal LV function with grade 1 diastolic dysfunction. She subsequently  followed up with pulmonology and underwent pulmonary stress testing which showed a normal VO2 max and anaerobic threshold. There is flattening of her O2 pulse curve associated with tachypnea and dead space ventilation. She did not have desaturation. It was ultimately felt that this represented a combination of cardiovascular and pulmonary limitations. In the setting of diastolic dysfunction on echo, she was placed on Lasix 20 mg daily as well as potassium chloride 10 mEq daily  by pulmonology. Follow-up basic metabolic panel 1 week later showed normal renal function with potassium of 5.1.  Since being on Lasix, she thinks that her breathing may be somewhat improved though with any amount of activity, she notes dyspnea on exertion, tachypnea, and mild retrosternal chest pressure/heaviness. Symptoms typically last 5-10 minutes and resolve with rest. She denies PND, orthopnea, dizziness, syncope, or early satiety. She has had mild left lower extremity swelling since diagnosis of DVT.  Home Medications    Prior to Admission medications   Medication Sig Start Date End Date Taking? Authorizing Provider  amLODipine (NORVASC) 5 MG tablet Take 5 mg by mouth at bedtime.   Yes Historical Provider, MD  Biotin 5 MG CAPS Take 5 mg by mouth daily.   Yes Historical Provider, MD  diazepam (VALIUM) 10 MG tablet Take 10 mg by mouth at bedtime.    Yes Historical Provider, MD  ELIQUIS 5 MG TABS tablet TAKE 1 TABLET (5 MG TOTAL) BY MOUTH 2 (TWO) TIMES DAILY. 06/01/16  Yes Brand Males, MD  fexofenadine (ALLEGRA) 30 MG tablet Take 30 mg by mouth daily.   Yes Historical Provider, MD  furosemide (LASIX) 20 MG tablet Take 1 tablet (20 mg total) by mouth daily. 06/01/16  Yes Brand Males, MD  levothyroxine (SYNTHROID, LEVOTHROID) 112 MCG tablet Take 112 mcg by mouth daily before breakfast. Brand name only**   Yes Historical Provider, MD  naproxen (NAPROSYN) 500 MG tablet Take 1 tablet by mouth daily as needed. 01/19/16  Yes Historical Provider, MD  potassium chloride (KLOR-CON M10) 10 MEQ tablet Take 1 tablet (10 mEq total) by mouth daily. 06/01/16  Yes Brand Males, MD    Review of Systems    As above, she has had mild improvement in dyspnea on exertion though it persists and is associated with mild chest heaviness. She has had mild left lower extremity swelling since diagnosis of DVT. She denies PND, orthopnea, palpitations, dizziness, syncope, or early satiety.  All other systems  reviewed and are otherwise negative except as noted above.  Physical Exam    VS:  BP 130/76   Pulse 87   Ht 5\' 2"  (1.575 m)   Wt 130 lb (59 kg)   SpO2 96%   BMI 23.78 kg/m  , BMI Body mass index is 23.78 kg/m. GEN: Well nourished, well developed, in no acute distress.  HEENT: normal.  Neck: Supple, no JVD, carotid bruits, or masses. Cardiac: RRR, no murmurs, rubs, or gallops. No clubbing, cyanosis, Trace left ankle edema.  Radials/DP/PT 2+ and equal bilaterally.  Respiratory:  Respirations regular and unlabored, clear to auscultation bilaterally. GI: Soft, nontender, nondistended, BS + x 4. MS: no deformity or atrophy. Skin: warm and dry, no rash. Neuro:  Strength and sensation are intact. Psych: Normal affect.  Accessory Clinical Findings    2D Echocardiogram 03/2016  Procedures: Echo: Study Conclusions   - Left ventricle: The cavity size was normal. There was mild focal   basal hypertrophy of the septum. Systolic function was normal.   The estimated ejection  fraction was in the range of 50% to 55%.   Wall motion was normal; there were no regional wall motion   abnormalities. Doppler parameters are consistent with abnormal   left ventricular relaxation (grade 1 diastolic dysfunction).   Doppler parameters are consistent with indeterminate ventricular   filling pressure. - Aortic valve: Transvalvular velocity was within the normal range.   There was no stenosis. There was mild regurgitation. - Mitral valve: Transvalvular velocity was within the normal range.   There was no evidence for stenosis. There was trivial   regurgitation. - Right ventricle: The cavity size was normal. Wall thickness was   normal. Systolic function was normal. - Tricuspid valve: There was trivial regurgitation. - Pulmonary arteries: Systolic pressure was within the normal   Assessment & Plan    1.  Dyspnea on exertion/Diastolic Dysfunction/exertional chest heaviness: Patient now has a  four-month history of exertional dyspnea with extensive evaluation including echocardiogram which showed normal LV function and grade 1 diastolic dysfunction, 3 CT angios of the chest,  which were negative for pulmonary embolism, VQ scan, which was intermediate for PE, and pulmonary stress testing which suggested a combination of pulmonary and cardiovascular compromise.  She has been placed on low-dose Lasix in the setting of diastolic dysfunction and she thinks that maybe her symptoms have improved slightly but overall, she continues to have dyspnea on exertion and mild chest heaviness. In that setting, I will arrange for a Lexiscan Myoview to rule out cardiac ischemia as a potential cause of her dyspnea. She will continue to follow up with pulmonology and is scheduled for pulmonary rehabilitation. I am stopping her potassium as her follow-up potassium was 5.1 after being initiated on Lasix. I will follow-up a basic metabolic panel next week to ensure stability.  2. Left lower extremity DVT: Now being treated with eliquis. This appears to be stable.  3. Essential hypertension: Stable on amlodipine therapy.  4. Disposition: Follow-up stress testing as outlined above. Follow-up with Dr. Debara Pickett in one month or sooner if necessary.   Murray Hodgkins, NP 06/14/2016, 9:23 AM

## 2016-06-21 ENCOUNTER — Telehealth (HOSPITAL_COMMUNITY): Payer: Self-pay

## 2016-06-21 DIAGNOSIS — I519 Heart disease, unspecified: Secondary | ICD-10-CM | POA: Diagnosis not present

## 2016-06-21 NOTE — Telephone Encounter (Signed)
Encounter complete. 

## 2016-06-22 LAB — BASIC METABOLIC PANEL
BUN: 19 mg/dL (ref 7–25)
CALCIUM: 9.2 mg/dL (ref 8.6–10.4)
CO2: 30 mmol/L (ref 20–31)
Chloride: 106 mmol/L (ref 98–110)
Creat: 0.82 mg/dL (ref 0.60–0.93)
Glucose, Bld: 78 mg/dL (ref 65–99)
POTASSIUM: 4.7 mmol/L (ref 3.5–5.3)
SODIUM: 143 mmol/L (ref 135–146)

## 2016-06-23 ENCOUNTER — Ambulatory Visit (HOSPITAL_COMMUNITY)
Admission: RE | Admit: 2016-06-23 | Discharge: 2016-06-23 | Disposition: A | Discharge status: Home / Self Care | Payer: Medicare Other | Source: Ambulatory Visit | Attending: Cardiology | Admitting: Cardiology

## 2016-06-23 ENCOUNTER — Encounter: Payer: Self-pay | Admitting: *Deleted

## 2016-06-23 DIAGNOSIS — R0789 Other chest pain: Secondary | ICD-10-CM

## 2016-06-23 LAB — MYOCARDIAL PERFUSION IMAGING
CHL CUP RESTING HR STRESS: 67 {beats}/min
CSEPPHR: 105 {beats}/min
LV sys vol: 21 mL
LVDIAVOL: 57 mL (ref 46–106)
SDS: 4
SRS: 0
SSS: 4
TID: 1.22

## 2016-06-23 MED ORDER — TECHNETIUM TC 99M TETROFOSMIN IV KIT
31.8000 | PACK | Freq: Once | INTRAVENOUS | Status: AC | PRN
  Administered 2016-06-23: 31.8 via INTRAVENOUS
  Filled 2016-06-23: qty 32

## 2016-06-23 MED ORDER — TECHNETIUM TC 99M TETROFOSMIN IV KIT
10.2000 | PACK | Freq: Once | INTRAVENOUS | Status: AC | PRN
  Administered 2016-06-23: 10.2 via INTRAVENOUS
  Filled 2016-06-23: qty 11

## 2016-06-23 MED ORDER — AMINOPHYLLINE 25 MG/ML IV SOLN
75.0000 mg | Freq: Once | INTRAVENOUS | Status: AC
  Administered 2016-06-23: 75 mg via INTRAVENOUS

## 2016-06-23 MED ORDER — REGADENOSON 0.4 MG/5ML IV SOLN
0.4000 mg | Freq: Once | INTRAVENOUS | Status: AC
  Administered 2016-06-23: 0.4 mg via INTRAVENOUS

## 2016-06-24 ENCOUNTER — Telehealth: Payer: Self-pay | Admitting: Nurse Practitioner

## 2016-06-24 NOTE — Telephone Encounter (Signed)
Pt called back re: lab results.  She verbalized understanding.

## 2016-06-24 NOTE — Telephone Encounter (Signed)
Follow Up:     Returning your call,this is the correct phone number.

## 2016-06-28 ENCOUNTER — Other Ambulatory Visit: Payer: Medicare Other

## 2016-07-08 DIAGNOSIS — R911 Solitary pulmonary nodule: Secondary | ICD-10-CM | POA: Diagnosis not present

## 2016-07-08 DIAGNOSIS — M810 Age-related osteoporosis without current pathological fracture: Secondary | ICD-10-CM | POA: Diagnosis not present

## 2016-07-11 DIAGNOSIS — Z1231 Encounter for screening mammogram for malignant neoplasm of breast: Secondary | ICD-10-CM | POA: Diagnosis not present

## 2016-07-13 DIAGNOSIS — N6001 Solitary cyst of right breast: Secondary | ICD-10-CM | POA: Diagnosis not present

## 2016-07-20 DIAGNOSIS — M25529 Pain in unspecified elbow: Secondary | ICD-10-CM | POA: Diagnosis not present

## 2016-07-20 DIAGNOSIS — L659 Nonscarring hair loss, unspecified: Secondary | ICD-10-CM | POA: Diagnosis not present

## 2016-07-20 DIAGNOSIS — S6991XA Unspecified injury of right wrist, hand and finger(s), initial encounter: Secondary | ICD-10-CM | POA: Diagnosis not present

## 2016-07-20 DIAGNOSIS — M799 Soft tissue disorder, unspecified: Secondary | ICD-10-CM | POA: Diagnosis not present

## 2016-07-22 ENCOUNTER — Ambulatory Visit: Payer: Medicare Other | Admitting: Internal Medicine

## 2016-08-10 ENCOUNTER — Ambulatory Visit (INDEPENDENT_AMBULATORY_CARE_PROVIDER_SITE_OTHER): Payer: Medicare Other | Admitting: Internal Medicine

## 2016-08-10 ENCOUNTER — Encounter: Payer: Self-pay | Admitting: Internal Medicine

## 2016-08-10 VITALS — BP 130/72 | HR 69 | Ht 62.0 in | Wt 128.0 lb

## 2016-08-10 DIAGNOSIS — R938 Abnormal findings on diagnostic imaging of other specified body structures: Secondary | ICD-10-CM | POA: Diagnosis not present

## 2016-08-10 DIAGNOSIS — R0689 Other abnormalities of breathing: Secondary | ICD-10-CM

## 2016-08-10 DIAGNOSIS — M2540 Effusion, unspecified joint: Secondary | ICD-10-CM | POA: Diagnosis not present

## 2016-08-10 DIAGNOSIS — R9389 Abnormal findings on diagnostic imaging of other specified body structures: Secondary | ICD-10-CM

## 2016-08-10 DIAGNOSIS — I519 Heart disease, unspecified: Secondary | ICD-10-CM | POA: Diagnosis not present

## 2016-08-10 DIAGNOSIS — Z86718 Personal history of other venous thrombosis and embolism: Secondary | ICD-10-CM

## 2016-08-10 DIAGNOSIS — M25531 Pain in right wrist: Secondary | ICD-10-CM | POA: Diagnosis not present

## 2016-08-10 DIAGNOSIS — R0600 Dyspnea, unspecified: Secondary | ICD-10-CM | POA: Diagnosis not present

## 2016-08-10 DIAGNOSIS — S59291A Other physeal fracture of lower end of radius, right arm, initial encounter for closed fracture: Secondary | ICD-10-CM | POA: Diagnosis not present

## 2016-08-10 DIAGNOSIS — S52501A Unspecified fracture of the lower end of right radius, initial encounter for closed fracture: Secondary | ICD-10-CM | POA: Diagnosis not present

## 2016-08-10 NOTE — Patient Instructions (Addendum)
Dyspnea and respiratory abnormality  - likely due to mild abnormalities of CT chest that are not clot related and also diastolic dysfunction - glad you are better with lasix and passage of time - respect decision not to attend rehab but gently exercise on bilke yourself - Get yourself a heart rate monitor with a chest strap and also finger pulse ox at meter  - Make sure heart rate does not exceed 135/m  - Keep pulse ox greater than 88% at all times  History of DVT of lower extremity -03/04/2016 - related to [place] related - d-dimer august and September 2017 normal - You have completed 5 months of therapy as of 08/10/2016 - Okay to stop anticoagulation - Start baby aspirin 81 mg daily Repeat D-dimer test March/April [id number]   Diastolic dysfunction, left ventricle - seen on echo august 2017 and bike stress test Oct 2017 -Continue lasix 20mg  daily with 37meq kcl daily - we will reassess the need at next visit  Abnormal CT scan, lung - ground glass opacity  Aug 2017 Abnormal PFT - aug 2017 with reduced diffusion - do HRCT supine and prone March/April 2018 (cancel CT chest in February 2018 if this is been scheduled]  [place]ol[place]up -  March/April 2018 - to report progress with dyspnea or sooner if needed

## 2016-08-10 NOTE — Progress Notes (Signed)
Subjective:     Patient ID: Gina Lyons, female   DOB: July 17, 1946, 70 y.o.   MRN: [id number]  HPI    70 year old female former smoker, only 3 pack year history, and for pulmonary consult July 2017 for DVT (No PE on CT chest x 3 ) treated with Xarelto.   TEST [name].  Venous Doppler, 03/04/2016 showed DVT in the short segment of left peroneal vein CTA chest , 7/18 2017 negative for pulmonary embolism, bibasilar sub-solid pulmonary nodules measuring up to 7 mm CTA chest 03/19/2016 negative for pulmonary embolism, stable dilation of the ascending aorta CTA chest 03/30/2016 negative for pulmonary emboli, bilateral ground glass opacities, nodular opacities within the lingula, right middle lobe and right lower lobe VQ scan 03/31/16 bilateral patchy matched ventilation/perfusion defects, intermediate  probability for pulmonary embolus. 2D echo 03/31/16>EF 50-55%, gr 1 DD , PAP 43mmHg Pulmonary function test on 03/31/2016 showed a FEV1 at 107%, ratio 77, FVC of 105%, no significant bronchodilator response, DLCO 63%  04/12/2016 post hospital follow-up Patient presents for a post hospital follow-up. It was diagnosed with a DVT on the left 03/04/2016. She was started on Xarelto. She was on Estradiol and she stopped this. CT chest on July 18 was negative for PE. Patient had 3 weeks of progressive shortness of breath She was admitted on 03/30/2016. Patient was admitted August 9 through 04/01/2016 for progressive shortness of breath. CT chest was done that was negative for pulmonary embolism. It did show bilateral groundglass opacities  and nodular opacities within the lingula, right middle lobe and right lower lobe. Was transitioned from [place] too Eliquis and hospitalization. Patient did undergo gentle diuresis. She was also treated with Levaquin for questionable atypical infection. A VQ scan was done that showed bilateral patchy matched ventilation and perfusion defects with intermittent probability for  pulmonary embolism. She says she was told she had a PE.from this .   A 2-D echo showed a normal EF at 50-55% with grade 1 diastolic dysfunctions and pulmonary artery pressures at 25 m of mercury. Pulmonary function test showed normal lung function with an FEV1 at 107%. Patient did have a diffusing defect =DLCO 63%.  Autoimmune and CTD workup was neg (HIV, ANCA, GBM, CCP , dsDNA ab , ANA )  Discharge. Patient is feeling some better but still gets winded with walking. No dyspnea at rest .  She denies cough, hemoptysis , abd pain, n/v./d. She does have some soreness along left LE.  Says prior to admit she was very active with yard work , cycling and house work. No dyspnea at all.  Prior to admit no long travel, injury , surgery . No hx of DVT/PE . No FH of DVT/PE.  No hx of cancer.     OV 05/13/2016  Chief Complaint  Patient presents with  . Follow-up    Pt here after CXR and seeing TP on 8.22.17. Pt states her SOB has not improved since also OV. PT c/o DOE, intermittent dry cough, chest tightness when SOB and resolves when she lays down. Pt states she had mild left foot swelling on 9.21.17, this has resolved. Pt denies f/c/s.     Gina Lyons 70 year old female with very limited remote smoking. In July 2017 she was admitted with left-sided DVT and was started on Xarelto. Sometime after that according to her history she started developing shortness of breath which has persisted to this day despite changes with Xarelto to es.E;liquis. This dyspnea is pervasive. It is moderate to  severe in intensity. It is present for exertion and relieved by rest. Class II-III levels of activity makes her dyspneic. There is no orthopnea paroxysmal nocturnal dyspnea. She had 3 CT scans of the chest in July 2017 that ruled out pulmonary embolism but in August 2017 she did have some new groundglass opacities. August 2017 VQ scan was of intermediate probability. Only function does show reduction in diffusion capacity  suggesting the groundglass opacities might be real. She did have autoimmune workup in August 2017 along with complement and vascular this workup that was normal. She presents today with her husband. She is really frustrated by her dyspnea. Chest x-ray personally visualized shows some nonspecific scarring. She is not able to do her household activities.  she has a dry cough of mild intensity for the last 6 months. Cough preceded onset of dyspnea and DVT. At last visit they specifically denied this cough. This no associated wheezing or orthopnea paroxysmal nocturnal dyspnea or chills.    Exhaled nitric oxide testing today in the office 05/13/2016: 23 ppb and normal   Walking desaturation test on 05/13/2016 185 feet x 3 laps:   did NOT desaturate. Rest pulse ox was 100%, final pulse ox was 100% was the final pulse ox w heart rate response was at rest78. HR response  to 94/min at peak exertion.    Noted SCL 70 not done (no dysphagia)  D dimer this visti 0.36 and normal  Dg Chest 2 View  Result Date: 05/13/2016 CLINICAL DATA:  Follow-up pulmonary infiltrates. EXAM: CHEST  2 VIEW COMPARISON:  04/07/2016 and 03/31/2016 FINDINGS: Mild hyperinflation is stable. Stable linear densities in the anterior left lung. No significant airspace disease or consolidation. Heart and mediastinum are within normal limits and stable. Surgical plate in the lower cervical spine. Trachea is midline. No large pleural effusions. Old left seventh rib fracture. IMPRESSION: Stable linear densities in the left lung are probably related to scarring. No acute cardiopulmonary disease. Electronically Signed   By: Markus Daft M.D.   On: 05/13/2016 14:25       OV 06/01/2016  Chief Complaint  Patient presents with  . Follow-up    Pt here after CPST. Pt states her SOB has slightly improved since last OV. Pt c/o dry cough and occassional chest tightness - under breasts after eating dinner in evening. Pt denies chest congestion and  f/c/s.     Dyspnea evaluation. Here for review of pulmonary stress test. She underwent pulmonary stress test 05/26/2016. She gave an excellent effort. She had normal VO2 max and anaerobic threshold. Nevertheless there is a flattening of her O2 pulse curve that is significant. This is associated with tachypnea and dead space ventilation and approaching ventilatory limits. Pulse ox did not go down. In summary this is a combination of cardiovascular and pulmonary limitations.     She is reporting weight gain since a deep vein thrombosis. So far pulmonary embolism has never been confirmed. In fact the CT chest in October 2017 only shows groundglass opacities. Echocardiogram shows diastolic dysfunction. In addition after coming off her estrogen which she was taking for many years [in July 2017 after DVT] she is reporting significant hot flashes which are frustrating her. She continues to suffer from dyspnea and is frustrated by this as well.     OV 08/10/2016  Chief Complaint  Patient presents with  . Follow-up    Pt states overall her SOB has improved but has bad days every now and then. Pt c/o more  SOB today and occassional dry cough with some  frank blood - size of penny. Pt also states she has chest tightness when she is SOB - this resolves with rest.     Follow-up dyspnea: We finally determined that this dyspnea was unlikely due to pulmonary embolism. Was related to diastolic dysfunction and pulmonary groundglass infiltrates sustained in July 2017 at the time of deep vein thrombosis diagnosis. Last visit October 2017 I started Lasix therapy. Since then with the passage of time and Lasix therapy she has improved with her dyspnea. She still does have mild dyspnea on exertion. She has not attended pulmonary rehabilitation. She does not want to go to pulmonary habitation due to logistical issues and time issues. She and her husband said they will start exercising on a road bike for an exercise bike  by themselves gradually.  In terms of deep vein thrombosis related to estrogen intake.. She continues to be on anticoagulation since July 2017. She's completed 5 months of therapy. Interim d-dimer  normal. She has no bleeding complications. She wants to stop anticoagulation.   has a past medical history of Anxiety; Arthritis; Chronic lower back pain; Diastolic dysfunction; DVT (deep venous thrombosis) ([place]); Dyspnea on exertion; Family history of adverse reaction to anesthesia; History of blood transfusion (1964); Hypertension; Hypothyroidism; Migraine; Pneumonia (02/2016); and Tumor, thyroid.   reports that she quit smoking about 40 years ago. Her smoking use included Cigarettes. She has a 3.00 pack-year smoking history. She has never used smokeless tobacco.  Past Surgical History:  Procedure Laterality Date  . BACK SURGERY    . BILATERAL SALPINGOOPHORECTOMY Bilateral   . CARPAL TUNNEL RELEASE Right   . DILATION AND CURETTAGE OF UTERUS    . HEMORRHOID SURGERY    . [place] SURGERY  2013?  [facility] POSTERIOR CERVICAL FUSION/FORAMINOTOMY    . REFRACTIVE SURGERY Bilateral   . TOTAL THYROIDECTOMY  ?1974  . VAGINAL HYSTERECTOMY      Allergies  Allergen Reactions  . Imitrex [Sumatriptan] Other (See Comments)    syncope  . Celebrex [Celecoxib] Other (See Comments)    palpations   . Codeine Itching  . Percocet [Oxycodone-Acetaminophen] Itching    Immunization History  Administered Date(s) Administered  . Influenza, High Dose Seasonal PF 04/12/2016  . Pneumococcal Polysaccharide-23 03/31/2016    Family History  Problem Relation Age of Onset  . Cancer Sister     brain tumor  . Cancer Brother     unknown ca  . Kidney disease Mother   . COPD Father     smoker     Current Outpatient Prescriptions:  .  amLODipine (NORVASC) 5 MG tablet, Take 5 mg by mouth at bedtime., Disp: , Rfl:  .  Biotin 5 MG CAPS, Take 5 mg by mouth daily., Disp: , Rfl:  .  diazepam (VALIUM) 10 MG tablet, Take  10 mg by mouth at bedtime. , Disp: , Rfl:  .  ELIQUIS 5 MG TABS tablet, TAKE 1 TABLET (5 MG TOTAL) BY MOUTH 2 (TWO) TIMES DAILY., Disp: 60 tablet, Rfl: 5 .  fexofenadine (ALLEGRA) 30 MG tablet, Take 30 mg by mouth daily., Disp: , Rfl:  .  furosemide (LASIX) 20 MG tablet, Take 1 tablet (20 mg total) by mouth daily., Disp: 30 tablet, Rfl: 3 .  levothyroxine (SYNTHROID, LEVOTHROID) 112 MCG tablet, Take 112 mcg by mouth daily before breakfast. Brand name only**, Disp: , Rfl:  .  naproxen (NAPROSYN) 500 MG tablet, Take 1 tablet by mouth daily as  needed., Disp: , Rfl: 1 .  potassium chloride (KLOR-CON M10) 10 MEQ tablet, Take 1 tablet (10 mEq total) by mouth daily. (Patient not taking: Reported on 08/10/2016), Disp: 30 tablet, Rfl: 3     Review of Systems     Objective:   Physical Exam  Constitutional: She is oriented to person, place, and time. She appears well-developed and well-nourished. No distress.  HENT:  Head: Normocephalic and atraumatic.  Right Ear: External ear normal.  Left Ear: External ear normal.  Mouth/Throat: Oropharynx is clear and moist. No oropharyngeal exudate.  Eyes: Conjunctivae and EOM are normal. Pupils are equal, round, and reactive to light. Right eye exhibits no discharge. Left eye exhibits no discharge. No scleral icterus.  Neck: Normal range of motion. Neck supple. No JVD present. No tracheal deviation present. No thyromegaly present.  Cardiovascular: Normal rate, regular rhythm, normal heart sounds and intact distal pulses.  Exam reveals no gallop and no friction rub.   No murmur heard. Pulmonary/Chest: Effort normal and breath sounds normal. No respiratory distress. She has no wheezes. She has no rales. She exhibits no tenderness.  Abdominal: Soft. Bowel sounds are normal. She exhibits no distension and no mass. There is no tenderness. There is no rebound and no guarding.  Musculoskeletal: Normal range of motion. She exhibits no edema or tenderness.   Lymphadenopathy:    She has no cervical adenopathy.  Neurological: She is alert and oriented to person, place, and time. She has normal reflexes. No cranial nerve deficit. She exhibits normal muscle tone. Coordination normal.  Skin: Skin is warm and dry. No rash noted. She is not diaphoretic. No erythema. No pallor.  Psychiatric: She has a normal mood and affect. Her behavior is normal. Judgment and thought content normal.  Vitals reviewed.  Vitals:   08/10/16 0943  BP: 130/72  Pulse: 69  SpO2: 98%  Weight: 128 lb (58.1 kg)  Height: 5\' 2"  (1.575 m)   Estimated body mass index is 23.41 kg/m as calculated from the following:   Height as of this encounter: 5\' 2"  (1.575 m).   Weight as of this encounter: 128 lb (58.1 kg).       Assessment:       ICD-9-CM ICD-10-CM   1. Dyspnea and respiratory abnormality 786.09 R06.00     R06.89   2. Diastolic dysfunction, left ventricle 429.9 I51.9   3. History of DVT of lower extremity V12.51 Z86.718        Plan:     Dyspnea and respiratory abnormality  - likely due to mild abnormalities of CT chest that are not clot related and also diastolic dysfunction - glad you are better with lasix and passage of time - respect decision not to attend rehab but gently exercise on bilke yourself - Get yourself a heart rate monitor with a chest strap and also finger pulse ox at meter  - Make sure heart rate does not exceed 135/m  - Keep pulse ox greater than 88% at all times  History of DVT of lower extremity -03/04/2016 - related to estriogen related - d-dimer august and September 2017 normal - You have completed 5 months of therapy as of 08/10/2016 - Okay to stop anticoagulation - Start baby aspirin 81 mg daily Repeat D-dimer test March/April [id number]   Diastolic dysfunction, left ventricle - seen on echo august 2017 and bike stress test Oct 2017 -Continue lasix 20mg  daily with 59meq kcl daily - we will reassess the need at next  visit  Abnormal CT scan, lung - ground glass opacity  Aug 2017 Abnormal PFT - aug 2017 with reduced diffusion - do HRCT supine and prone March/April 2018 (cancel CT chest in February 2018 if this is been scheduled]  [place]ol[place]up -  March/April 2018 - to report progress with dyspnea or sooner if needed  Dr. Brand Males, M.D., [facility].C.P Pulmonary and Critical Care Medicine Staff Physician [facility] Pager: [phone], If no answer or between  15:00h - 7:00h: call [phone]  08/10/2016 10:04 AM

## 2016-09-05 DIAGNOSIS — S52501D Unspecified fracture of the lower end of right radius, subsequent encounter for closed fracture with routine healing: Secondary | ICD-10-CM | POA: Diagnosis not present

## 2016-09-21 ENCOUNTER — Other Ambulatory Visit: Payer: Self-pay | Admitting: Internal Medicine

## 2016-09-21 DIAGNOSIS — I519 Heart disease, unspecified: Secondary | ICD-10-CM

## 2016-09-21 NOTE — Telephone Encounter (Signed)
Lasix and KCl rx'd at the 10.11.17 visit #30 with 3 refills.  Pr last seen 12.17.17 and recommended to continue.  Spoke with Daneil Dan, okay to refill x1 and defer to cards.

## 2016-09-25 ENCOUNTER — Other Ambulatory Visit: Payer: Self-pay | Admitting: Internal Medicine

## 2016-09-25 DIAGNOSIS — I519 Heart disease, unspecified: Secondary | ICD-10-CM

## 2016-09-26 DIAGNOSIS — S52501D Unspecified fracture of the lower end of right radius, subsequent encounter for closed fracture with routine healing: Secondary | ICD-10-CM | POA: Diagnosis not present

## 2016-10-12 ENCOUNTER — Emergency Department (HOSPITAL_COMMUNITY)
Admission: EM | Admit: 2016-10-12 | Discharge: 2016-10-12 | Disposition: A | Discharge status: Home / Self Care | Payer: Medicare Other | Attending: Emergency Medicine | Admitting: Emergency Medicine

## 2016-10-12 ENCOUNTER — Emergency Department (HOSPITAL_BASED_OUTPATIENT_CLINIC_OR_DEPARTMENT_OTHER)
Admit: 2016-10-12 | Discharge: 2016-10-12 | Disposition: A | Discharge status: Home / Self Care | Payer: Medicare Other | Attending: Emergency Medicine | Admitting: Emergency Medicine

## 2016-10-12 ENCOUNTER — Encounter (HOSPITAL_COMMUNITY): Payer: Self-pay | Admitting: Emergency Medicine

## 2016-10-12 ENCOUNTER — Telehealth: Payer: Self-pay | Admitting: Internal Medicine

## 2016-10-12 DIAGNOSIS — Z7901 Long term (current) use of anticoagulants: Secondary | ICD-10-CM | POA: Diagnosis not present

## 2016-10-12 DIAGNOSIS — Z79899 Other long term (current) drug therapy: Secondary | ICD-10-CM | POA: Diagnosis not present

## 2016-10-12 DIAGNOSIS — Z87891 Personal history of nicotine dependence: Secondary | ICD-10-CM | POA: Diagnosis not present

## 2016-10-12 DIAGNOSIS — M79605 Pain in left leg: Secondary | ICD-10-CM | POA: Insufficient documentation

## 2016-10-12 DIAGNOSIS — E039 Hypothyroidism, unspecified: Secondary | ICD-10-CM | POA: Diagnosis not present

## 2016-10-12 DIAGNOSIS — M79609 Pain in unspecified limb: Secondary | ICD-10-CM | POA: Diagnosis not present

## 2016-10-12 DIAGNOSIS — I10 Essential (primary) hypertension: Secondary | ICD-10-CM | POA: Diagnosis not present

## 2016-10-12 LAB — CBC
HEMATOCRIT: 35.4 % — AB (ref 36.0–46.0)
Hemoglobin: 11.6 g/dL — ABNORMAL LOW (ref 12.0–15.0)
MCH: 31.4 pg (ref 26.0–34.0)
MCHC: 32.8 g/dL (ref 30.0–36.0)
MCV: 95.7 fL (ref 78.0–100.0)
PLATELETS: 340 10*3/uL (ref 150–400)
RBC: 3.7 MIL/uL — AB (ref 3.87–5.11)
RDW: 13.3 % (ref 11.5–15.5)
WBC: 6.4 10*3/uL (ref 4.0–10.5)

## 2016-10-12 LAB — BASIC METABOLIC PANEL
ANION GAP: 9 (ref 5–15)
BUN: 20 mg/dL (ref 6–20)
CALCIUM: 8.8 mg/dL — AB (ref 8.9–10.3)
CO2: 23 mmol/L (ref 22–32)
CREATININE: 0.72 mg/dL (ref 0.44–1.00)
Chloride: 109 mmol/L (ref 101–111)
GFR calc Af Amer: >60 mL/min (ref >60)
Glucose, Bld: 74 mg/dL (ref 65–99)
Potassium: 3.7 mmol/L (ref 3.5–5.1)
Sodium: 141 mmol/L (ref 135–145)

## 2016-10-12 NOTE — ED Notes (Signed)
Pt states she understands instructions and will follow up MD in 1 week. Home stable with husband.with steady gait.

## 2016-10-12 NOTE — ED Triage Notes (Addendum)
Left thigh pain that rad into butt, burning pain called her dr and sent here for  [place]  To r/o clot, hx clot in same leg  And PE, had been on Eliquis but now off

## 2016-10-12 NOTE — Progress Notes (Signed)
**  Preliminary report by tech**  Left lower extremity venous duplex complete. There is no evidence of deep or superficial vein thrombosis involving the left lower extremity. All visualized vessels appear patent and compressible. There is no evidence of a Baker's cyst on the left. Results were given to, Dr. Noemi Chapel.  10/12/16 2:53 PM Carlos Levering RVT

## 2016-10-12 NOTE — Telephone Encounter (Signed)
Spoke with pt. States that she believes she has a blood clot in her leg. Reports that she has had 2 in the past. Advised her that she would need to be evaluated at an urgent care or ED as we do not have the proper equipment here at our office to evaluate this. She verbalized understanding and agreed. Nothing further was needed.

## 2016-10-12 NOTE — ED Provider Notes (Signed)
[facility] DEPT Provider Note   CSN: [id number] Arrival date & time: 10/12/16  1313     History   Chief Complaint Chief Complaint  Patient presents with  . Leg Pain  . DVT    HPI Gina Lyons is a 71 y.o. female.  71 yo F with a chief complaints of left flank pain. Going on for the past few days. Patient has a history of a left-sided DVT. Thinks it feels the same. No noted swelling to the patient. No Chest pain shortness breath.   The history is provided by the patient.  Leg Pain   This is a recurrent problem. The current episode started more than 2 days ago. The problem occurs constantly. The problem has not changed since onset.The pain is present in the left ankle. The quality of the pain is described as intermittent. The pain is at a severity of 8/10. The pain is moderate. She has tried nothing for the symptoms. The treatment provided no relief. There has been no history of extremity trauma.    Past Medical History:  Diagnosis Date  . Anxiety    "just w/this breathing problem" (03/30/2016)  . Arthritis    "back" (03/30/2016)  . Chronic lower back pain   . Diastolic dysfunction    a. 03/2016 Echo: EF 50-55%, no rwma, GR1 DD, mild AI, triv MR/TR, PASP 48mmHg.  Marland Kitchen DVT (deep venous thrombosis) ([place])    a. 02/2016 LLE DVT-->Xarelto, switched to Eliquis 03/2016 b/c pt felt that xarelto may have been causing shortness of breath.  . Dyspnea on exertion    a. 03/08/2016, 03/19/2016, 03/30/2016 CTA Chest: No PE;  b. 03/2016 V:Q Scan: Intermediate probability for PE;  c. 05/2016 Pulmonary Stress Test: Excellent effort, nl VO2 max and anaerobic threshold, flattening of O2 pulse curve associated with tachypnea and dead space ventilation and approaching ventilatory limits. No desat. Likely combined cardiac and pulmonary limitations.  . Family history of adverse reaction to anesthesia    "mom had hard time to wake up"  . History of blood transfusion 1964   "I hemorrhaged after my baby was  born"  . Hypertension   . Hypothyroidism   . Migraine    "under control w/RX" (03/30/2016)  . Pneumonia 02/2016   "didn't know I had it" (03/30/2016)  . Tumor, thyroid    "had feeler; not cancerous" (03/30/2016)    Patient Active Problem List   Diagnosis Date Noted  . Chest pressure 06/14/2016  . Abnormal PFT 06/01/2016  . Diastolic dysfunction, left ventricle 06/01/2016  . Abnormal CT scan, lung 06/01/2016  . Hot flashes 06/01/2016  . History of DVT of lower extremity 05/13/2016  . Dyspnea and respiratory abnormality 04/01/2016  . Hyperventilation 03/30/2016  . Respiratory distress 03/30/2016  . Tachypnea 03/30/2016  . SOB (shortness of breath) 03/30/2016  . Deep vein thrombosis (DVT) of distal vein of lower extremity ([place]) 03/28/2016  . Pulmonary infiltrate 03/28/2016  . Ascending aortic aneurysm ([place]) 03/28/2016    Past Surgical History:  Procedure Laterality Date  . BACK SURGERY    . BILATERAL SALPINGOOPHORECTOMY Bilateral   . CARPAL TUNNEL RELEASE Right   . DILATION AND CURETTAGE OF UTERUS    . HEMORRHOID SURGERY    . [place] SURGERY  2013?  Marland Kitchen POSTERIOR CERVICAL FUSION/FORAMINOTOMY    . REFRACTIVE SURGERY Bilateral   . TOTAL THYROIDECTOMY  ?1974  . VAGINAL HYSTERECTOMY      OB History    No data available  Home Medications    Prior to Admission medications   Medication Sig Start Date End Date Taking? Authorizing Provider  amLODipine (NORVASC) 5 MG tablet Take 5 mg by mouth at bedtime.    Historical Provider, MD  Biotin 5 MG CAPS Take 5 mg by mouth daily.    Historical Provider, MD  diazepam (VALIUM) 10 MG tablet Take 10 mg by mouth at bedtime.     Historical Provider, MD  ELIQUIS 5 MG TABS tablet TAKE 1 TABLET (5 MG TOTAL) BY MOUTH 2 (TWO) TIMES DAILY. 06/01/16   Brand Males, MD  fexofenadine (ALLEGRA) 30 MG tablet Take 30 mg by mouth daily.    Historical Provider, MD  furosemide (LASIX) 20 MG tablet TAKE 1 TABLET BY MOUTH DAILY 09/21/16   Brand Males, MD  levothyroxine (SYNTHROID, LEVOTHROID) 112 MCG tablet Take 112 mcg by mouth daily before breakfast. Brand name only**    Historical Provider, MD  naproxen (NAPROSYN) 500 MG tablet Take 1 tablet by mouth daily as needed. 01/19/16   Historical Provider, MD  potassium chloride (KLOR-CON M10) 10 MEQ tablet Take 1 tablet (10 mEq total) by mouth daily. Patient not taking: Reported on 08/10/2016 06/01/16   Brand Males, MD    Family History Family History  Problem Relation Age of Onset  . Cancer Sister     brain tumor  . Cancer Brother     unknown ca  . Kidney disease Mother   . COPD Father     smoker    Social History Social History  Substance Use Topics  . Smoking status: Former Smoker    Packs/day: 1.00    Years: 3.00    Types: Cigarettes    Quit date: 08/23/1975  . Smokeless tobacco: Never Used  . Alcohol use No     Allergies   Imitrex [sumatriptan]; Celebrex [celecoxib]; Codeine; and Percocet [oxycodone-acetaminophen]   Review of Systems Review of Systems  Constitutional: Negative for chills and fever.  HENT: Negative for congestion and rhinorrhea.   Eyes: Negative for redness and visual disturbance.  Respiratory: Negative for shortness of breath and wheezing.   Cardiovascular: Negative for chest pain and palpitations.  Gastrointestinal: Negative for nausea and vomiting.  Genitourinary: Negative for dysuria and urgency.  Musculoskeletal: Positive for arthralgias and myalgias.  Skin: Negative for pallor and wound.  Neurological: Negative for dizziness and headaches.     Physical Exam Updated Vital Signs BP 142/91   Pulse 76   Temp 97.9 F (36.6 C) (Oral)   Resp (!) 76   SpO2 95%   Physical Exam  Constitutional: She is oriented to person, place, and time. She appears well-developed and well-nourished. No distress.  HENT:  Head: Normocephalic and atraumatic.  Eyes: EOM are normal. Pupils are equal, round, and reactive to light.  Neck: Normal  range of motion. Neck supple.  Cardiovascular: Normal rate and regular rhythm.  Exam reveals no gallop and no friction rub.   No murmur heard. Pulmonary/Chest: Effort normal. She has no wheezes. She has no rales.  Abdominal: Soft. She exhibits no distension. There is no tenderness.  Musculoskeletal: She exhibits tenderness. She exhibits no edema.  Tenderness worse to the posterior aspect the ankle. There is no swelling noted. Pulse motor sensation is intact distally.  Very mild left gluteal pain.  Neurological: She is alert and oriented to person, place, and time.  Skin: Skin is warm and dry. She is not diaphoretic.  Psychiatric: She has a normal mood and affect. Her behavior  is normal.  Nursing note and vitals reviewed.    ED Treatments / Results  Labs (all labs ordered are listed, but only abnormal results are displayed) Labs Reviewed  CBC - Abnormal; Notable for the following:       Result Value   RBC 3.70 (*)    Hemoglobin 11.6 (*)    HCT 35.4 (*)    All other components within normal limits  BASIC METABOLIC PANEL - Abnormal; Notable for the following:    Calcium 8.8 (*)    All other components within normal limits    EKG  EKG Interpretation None       Radiology No results found.  Procedures Procedures (including critical care time)  Medications Ordered in ED Medications - No data to display   Initial Impression / Assessment and Plan / ED Course  I have reviewed the triage vital signs and the nursing notes.  Pertinent labs & imaging results that were available during my care of the patient were reviewed by me and considered in my medical decision making (see chart for details).     71 yo F With a chief complaint of left leg pain. She had a DVT study here that was negative. I'll have her follow-up with her family physician for possible re-ultrasound in a week or two.   4:47 PM:  I have discussed the diagnosis/risks/treatment options with the patient and  family and believe the pt to be eligible for discharge home to follow-up with PCP. We also discussed returning to the ED immediately if new or worsening sx occur. We discussed the sx which are most concerning (e.g., sudden worsening pain, fever, inability to tolerate by mouth) that necessitate immediate return. Medications administered to the patient during their visit and any new prescriptions provided to the patient are listed below.  Medications given during this visit Medications - No data to display   The patient appears reasonably screen and/or stabilized for discharge and I doubt any other medical condition or other [facility] requiring further screening, evaluation, or treatment in the ED at this time prior to discharge.    Final Clinical Impressions(s) / ED Diagnoses   Final diagnoses:  Left leg pain    New Prescriptions New Prescriptions   No medications on file     Deno Etienne, DO 10/12/16 2318

## 2016-10-25 ENCOUNTER — Encounter: Payer: Self-pay | Admitting: Internal Medicine

## 2016-10-25 ENCOUNTER — Ambulatory Visit (INDEPENDENT_AMBULATORY_CARE_PROVIDER_SITE_OTHER): Payer: Medicare Other | Admitting: Internal Medicine

## 2016-10-25 ENCOUNTER — Ambulatory Visit (INDEPENDENT_AMBULATORY_CARE_PROVIDER_SITE_OTHER)
Admission: RE | Admit: 2016-10-25 | Discharge: 2016-10-25 | Disposition: A | Discharge status: Home / Self Care | Payer: Medicare Other | Source: Ambulatory Visit | Attending: Internal Medicine | Admitting: Internal Medicine

## 2016-10-25 DIAGNOSIS — I824Z2 Acute embolism and thrombosis of unspecified deep veins of left distal lower extremity: Secondary | ICD-10-CM | POA: Diagnosis not present

## 2016-10-25 DIAGNOSIS — R0600 Dyspnea, unspecified: Secondary | ICD-10-CM | POA: Diagnosis not present

## 2016-10-25 DIAGNOSIS — R918 Other nonspecific abnormal finding of lung field: Secondary | ICD-10-CM

## 2016-10-25 DIAGNOSIS — R938 Abnormal findings on diagnostic imaging of other specified body structures: Secondary | ICD-10-CM

## 2016-10-25 DIAGNOSIS — R9389 Abnormal findings on diagnostic imaging of other specified body structures: Secondary | ICD-10-CM

## 2016-10-25 NOTE — Assessment & Plan Note (Signed)
Unclear nature of pulmonary findings n CT chest 10/25/2016 [facility]  Kidney function feb 2018 was normal Need to rule out low grade infection  PLAN  - week of April 9th; plan for bronchoscopy with lavage sometime after 4pm at either [place] or [place] locations. Preferred is Monday April 9th  Followup  -late April 2018/ early may 2018 for discussion of bronc results

## 2016-10-25 NOTE — Assessment & Plan Note (Signed)
You had dvt Left lower extremity august 2017 Unclear why you are having lower extremity pain since then D-dimer test sept 2017 was normal and we stopped blood thnner Duplex lower extremity feb 2018 was normal  PLAN  -refer [facility] Dr Trula Slade and team

## 2016-10-25 NOTE — Patient Instructions (Signed)
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 Preferred is Monday April 9th  Followup  -late April 2018/ early may 2018 for discussion of bronc results

## 2016-10-25 NOTE — Progress Notes (Signed)
Subjective:     Patient ID: [name], female   DOB: [birth year], 71 y.o.   MRN: [id number]  HPI    71 year old female former smoker, only 3 pack year history, and for pulmonary consult July 2017 for DVT (No PE on CT chest x 3 ) treated with Xarelto.   TEST [name].  Venous Doppler, 03/04/2016 showed DVT in the short segment of left peroneal vein CTA chest , 7/18 2017 negative for pulmonary embolism, bibasilar sub-solid pulmonary nodules measuring up to 7 mm CTA chest 03/19/2016 negative for pulmonary embolism, stable dilation of the ascending aorta CTA chest 03/30/2016 negative for pulmonary emboli, bilateral ground glass opacities, nodular opacities within the lingula, right middle lobe and right lower lobe VQ scan 03/31/16 bilateral patchy matched ventilation/perfusion defects, intermediate  probability for pulmonary embolus. 2D echo 03/31/16>EF 50-55%, gr 1 DD , PAP 54mHg Pulmonary function test on 03/31/2016 showed a FEV1 at 107%, ratio 77, FVC of 105%, no significant bronchodilator response, DLCO 63%  04/12/2016 post hospital follow-up Patient presents for a post hospital follow-up. It was diagnosed with a DVT on the left 03/04/2016. She was started on Xarelto. She was on Estradiol and she stopped this. CT chest on July 18 was negative for PE. Patient had 3 weeks of progressive shortness of breath She was admitted on 03/30/2016. Patient was admitted August 9 through 04/01/2016 for progressive shortness of breath. CT chest was done that was negative for pulmonary embolism. It did show bilateral groundglass opacities  and nodular opacities within the lingula, right middle lobe and right lower lobe. Was transitioned from XRiotoo Eliquis and hospitalization. Patient did undergo gentle diuresis. She was also treated with Levaquin for questionable atypical infection. A VQ scan was done that showed bilateral patchy matched ventilation and perfusion defects with intermittent probability for  pulmonary embolism. She says she was told she had a PE.from this .   A 2-D echo showed a normal EF at 50-55% with grade 1 diastolic dysfunctions and pulmonary artery pressures at 25 m of mercury. Pulmonary function test showed normal lung function with an FEV1 at 107%. Patient did have a diffusing defect =DLCO 63%.  Autoimmune and CTD workup was neg (HIV, ANCA, GBM, CCP , dsDNA ab , ANA )  Discharge. Patient is feeling some better but still gets winded with walking. No dyspnea at rest .  She denies cough, hemoptysis , abd pain, n/v./d. She does have some soreness along left LE.  Says prior to admit she was very active with yard work , cycling and house work. No dyspnea at all.  Prior to admit no long travel, injury , surgery . No hx of DVT/PE . No FH of DVT/PE.  No hx of cancer.     OV 05/13/2016  Chief Complaint  Patient presents with  . Follow-up    Pt here after CXR and seeing TP on 8.22.17. Pt states her SOB has not improved since also OV. PT c/o DOE, intermittent dry cough, chest tightness when SOB and resolves when she lays down. Pt states she had mild left foot swelling on 9.21.17, this has resolved. Pt denies f/c/s.     [name]89year old female with very limited remote smoking. In July 2017 she was admitted with left-sided DVT and was started on Xarelto. Sometime after that according to her history she started developing shortness of breath which has persisted to this day despite changes with Xarelto to es.E;liquis. This dyspnea is pervasive. It is moderate to  severe in intensity. It is present for exertion and relieved by rest. Class II-III levels of activity makes her dyspneic. There is no orthopnea paroxysmal nocturnal dyspnea. She had 3 CT scans of the chest in July 2017 that ruled out pulmonary embolism but in August 2017 she did have some new groundglass opacities. August 2017 VQ scan was of intermediate probability. Only function does show reduction in diffusion capacity  suggesting the groundglass opacities might be real. She did have autoimmune workup in August 2017 along with complement and vascular this workup that was normal. She presents today with her husband. She is really frustrated by her dyspnea. Chest x-ray personally visualized shows some nonspecific scarring. She is not able to do her household activities.  she has a dry cough of mild intensity for the last 6 months. Cough preceded onset of dyspnea and DVT. At last visit they specifically denied this cough. This no associated wheezing or orthopnea paroxysmal nocturnal dyspnea or chills.    Exhaled nitric oxide testing today in the office 05/13/2016: 23 ppb and normal   Walking desaturation test on 05/13/2016 185 feet x 3 laps:   did NOT desaturate. Rest pulse ox was 100%, final pulse ox was 100% was the final pulse ox w heart rate response was at rest78. HR response  to 94/min at peak exertion.    Noted SCL 70 not done (no dysphagia)  D dimer this visti 0.36 and n  ormal  Dg Chest 2 View  Result Date: 05/13/2016 CLINICAL DATA:  Follow-up pulmonary infiltrates. EXAM: CHEST  2 VIEW COMPARISON:  04/07/2016 and 03/31/2016 FINDINGS: Mild hyperinflation is stable. Stable linear densities in the anterior left lung. No significant airspace disease or consolidation. Heart and mediastinum are within normal limits and stable. Surgical plate in the lower cervical spine. Trachea is midline. No large pleural effusions. Old left seventh rib fracture. IMPRESSION: Stable linear densities in the left lung are probably related to scarring. No acute cardiopulmonary disease. Electronically Signed   By: Markus Daft M.D.   On: 05/13/2016 14:25       OV 06/01/2016  Chief Complaint  Patient presents with  . Follow-up    Pt here after CPST. Pt states her SOB has slightly improved since last OV. Pt c/o dry cough and occassional chest tightness - under breasts after eating dinner in evening. Pt denies chest congestion  and f/c/s.     Dyspnea evaluation. Here for review of pulmonary stress test. She underwent pulmonary stress test 05/26/2016. She gave an excellent effort. She had normal VO2 max and anaerobic threshold. Nevertheless there is a flattening of her O2 pulse curve that is significant. This is associated with tachypnea and dead space ventilation and approaching ventilatory limits. Pulse ox did not go down. In summary this is a combination of cardiovascular and pulmonary limitations.     She is reporting weight gain since a deep vein thrombosis. So far pulmonary embolism has never been confirmed. In fact the CT chest in October 2017 only shows groundglass opacities. Echocardiogram shows diastolic dysfunction. In addition after coming off her estrogen which she was taking for many years [in July 2017 after DVT] she is reporting significant hot flashes which are frustrating her. She continues to suffer from dyspnea and is frustrated by this as well.     OV 08/10/2016  Chief Complaint  Patient presents with  . Follow-up    Pt states overall her SOB has improved but has bad days every now and then. Pt  c/o more SOB today and occassional dry cough with some  frank blood - size of penny. Pt also states she has chest tightness when she is SOB - this resolves with rest.     Follow-up dyspnea: We finally determined that this dyspnea was unlikely due to pulmonary embolism. Was related to diastolic dysfunction and pulmonary groundglass infiltrates sustained in July 2017 at the time of deep vein thrombosis diagnosis. Last visit October 2017 I started Lasix therapy. Since then with the passage of time and Lasix therapy she has improved with her dyspnea. She still does have mild dyspnea on exertion. She has not attended pulmonary rehabilitation. She does not want to go to pulmonary habitation due to logistical issues and time issues. She and her husband said they will start exercising on a road bike for an exercise  bike by themselves gradually.  In terms of deep vein thrombosis related to estrogen intake.. She continues to be on anticoagulation since July 2017. She's completed 5 months of therapy. Interim d-dimer  normal. She has no bleeding complications. She wants to stop anticoagulation.  OV 10/25/2016  Chief Complaint  Patient presents with  . Follow-up    Pt here after HRCT. pt states her breathing is unchanged since last OV. Pt denies cough and CP/tightness.     Follow-up dyspnea: At last visit she told me that with Lasix therapy that her dyspnea improved. Today she tells me that she has now plateaued in terms of improvement. She still has dyspnea on exertion relieved by rest. The Lasix is helping but without the Lasix it is worse. It at the same time she feels she has not returned to her baseline pre-summer 2017. High-resolution CT scan of the chest done today and personally visualized shows the following abnormalities described suggestive of MAI infection  In terms of the left lower extremity DVT: In December 2017 we stopped anticoag based on normal D-dimer. She says since summer 2017 continues with daily left pain that sarts in ankle and spreads up to popliteal area along the venous course. On February 2018 on the 21st she had Doppler ultrasound of the left lower extremity that did not show any DVT. She also had chemistries that are normal. She takes Naprosyn once daily for this. She says all this is new since the DVT. She is frustrated by the pain. She says that primary care has deferred this issue to me also notices associated edema. She is wondering about vascular insufficiency and is requesting whether she should be referred to a vascular evaluation  Ct Chest High Resolution  Result Date: 10/25/2016 CLINICAL DATA:  Dyspnea and wheezing. Former smoker. History of pulmonary embolism and DVT. Follow-up abnormal chest CT. EXAM: CT CHEST WITHOUT CONTRAST TECHNIQUE: Multidetector CT imaging of the chest  was performed following the standard protocol without intravenous contrast. High resolution imaging of the lungs, as well as inspiratory and expiratory imaging, was performed. COMPARISON:  03/30/2016 chest CT angiogram. 07/08/2016 chest radiograph. FINDINGS: Cardiovascular: Normal heart size. No significant pericardial fluid/thickening. Left anterior descending coronary atherosclerosis. Atherosclerotic thoracic aorta with stable mild ectasia of the ascending thoracic aorta (maximum diameter 3.9 cm). Normal caliber pulmonary arteries. Mediastinum/Nodes: Status post left hemithyroidectomy. No discrete right thyroid lobe nodules. Unremarkable esophagus. No pathologically enlarged axillary, mediastinal or gross hilar lymph nodes, noting limited sensitivity for the detection of hilar adenopathy on this noncontrast study. Lungs/Pleura: No pneumothorax. No pleural effusion. There is mild-to-moderate cylindrical and varicoid bronchiectasis extensively distributed throughout both lungs, most prominent in  the basilar right upper lobe, right middle lobe and lingula. There is extensive patchy tree-in-bud and centrilobular nodularity throughout both lungs at the areas of bronchiectasis, which appears overall mildly worsened since 03/30/2016, including a new dominant nodular 13 x 11 mm opacity in the medial dependent right lower lobe (series 3/ image 76). Bandlike subpleural opacities at the areas of bronchiectasis in the basilar right upper lobe, medial segment right middle lobe and lingula are stable and compatible with postinfectious scars. Otherwise no acute consolidative airspace disease or lung masses. Mild centrilobular emphysema. Mild patchy air trapping in both lungs on the expiration sequence. No frank honeycombing. Upper abdomen: Partially visualized left parapelvic renal cysts. Musculoskeletal: No aggressive appearing focal osseous lesions. Partially visualized surgical hardware from ACDF in the lower cervical spine.  Stable T9 vertebral hemangioma. Mild thoracic spondylosis. IMPRESSION: 1. 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 Findings have mildly worsened in the interval since 03/30/2016, including a new dominant 13 mm nodular opacity in the right lower lobe. Recommend initial follow-up post treatment high-resolution chest CT in 6 months. 2. Mild patchy air trapping in both lungs, suggesting small airways disease. 3. Aortic atherosclerosis.  One vessel coronary atherosclerosis. Electronically Signed   By: Ilona Sorrel M.D.   On: 10/25/2016 10:07      has a past medical history of Anxiety; Arthritis; Chronic lower back pain; Diastolic dysfunction; DVT (deep venous thrombosis) ([place]); Dyspnea on exertion; Family history of adverse reaction to anesthesia; History of blood transfusion (1964); Hypertension; Hypothyroidism; Migraine; Pneumonia (02/2016); and Tumor, thyroid.   reports that she quit smoking about 41 years ago. Her smoking use included Cigarettes. She has a 3.00 pack-year smoking history. She has never used smokeless tobacco.  Past Surgical History:  Procedure Laterality Date  . BACK SURGERY    . BILATERAL SALPINGOOPHORECTOMY Bilateral   . CARPAL TUNNEL RELEASE Right   . DILATION AND CURETTAGE OF UTERUS    . HEMORRHOID SURGERY    . Equality SURGERY  2013?  [facility] POSTERIOR CERVICAL FUSION/FORAMINOTOMY    . REFRACTIVE SURGERY Bilateral   . TOTAL THYROIDECTOMY  ?1974  . VAGINAL HYSTERECTOMY      Allergies  Allergen Reactions  . Imitrex [Sumatriptan] Other (See Comments)    syncope  . Celebrex [Celecoxib] Other (See Comments)    palpations   . Codeine Itching  . Percocet  [Oxycodone-Acetaminophen] Itching    Immunization History  Administered Date(s) Administered  . Influenza, High Dose Seasonal PF 04/12/2016  . Pneumococcal Polysaccharide-23 03/31/2016    Family History  Problem Relation Age of Onset  . Cancer Sister     brain tumor  . Cancer Brother     unknown ca  . Kidney disease Mother   . COPD Father     smoker     Current Outpatient Prescriptions:  .  amLODipine (NORVASC) 5 MG tablet, Take 5 mg by mouth at bedtime., Disp: , Rfl:  .  aspirin EC 81 MG tablet, Take 81 mg by mouth daily., Disp: , Rfl:  .  Biotin 5 MG CAPS, Take 5 mg by mouth daily., Disp: , Rfl:  .  diazepam (VALIUM) 10 MG tablet, Take 10 mg by mouth at bedtime. , Disp: , Rfl:  .  fexofenadine (ALLEGRA) 30 MG tablet, Take 30 mg by mouth daily.,  Disp: , Rfl:  .  furosemide (LASIX) 20 MG tablet, TAKE 1 TABLET BY MOUTH DAILY, Disp: 30 tablet, Rfl: 1 .  levothyroxine (SYNTHROID, LEVOTHROID) 112 MCG tablet, Take 112 mcg by mouth daily before breakfast. Brand name only**, Disp: , Rfl:  .  naproxen (NAPROSYN) 500 MG tablet, Take 1 tablet by mouth daily as needed., Disp: , Rfl: 1   Review of Systems     Objective:   Physical Exam  Constitutional: She is oriented to person, place, and time. She appears well-developed and well-nourished. No distress.  HENT:  Head: Normocephalic and atraumatic.  Right Ear: External ear normal.  Left Ear: External ear normal.  Mouth/Throat: Oropharynx is clear and moist. No oropharyngeal exudate.  Eyes: Conjunctivae and EOM are normal. Pupils are equal, round, and reactive to light. Right eye exhibits no discharge. Left eye exhibits no discharge. No scleral icterus.  Neck: Normal range of motion. Neck supple. No JVD present. No tracheal deviation present. No thyromegaly present.  Cardiovascular: Normal rate, regular rhythm, normal heart sounds and intact distal pulses.  Exam reveals no gallop and no friction rub.   No murmur  heard. Pulmonary/Chest: Effort normal and breath sounds normal. No respiratory distress. She has no wheezes. She has no rales. She exhibits no tenderness.  Abdominal: Soft. Bowel sounds are normal. She exhibits no distension and no mass. There is no tenderness. There is no rebound and no guarding.  Musculoskeletal: Normal range of motion. She exhibits no edema or tenderness.  Lymphadenopathy:    She has no cervical adenopathy.  Neurological: She is alert and oriented to person, place, and time. She has normal reflexes. No cranial nerve deficit. She exhibits normal muscle tone. Coordination normal.  Skin: Skin is warm and dry. No rash noted. She is not diaphoretic. No erythema. No pallor.  Psychiatric: She has a normal mood and affect. Her behavior is normal. Judgment and thought content normal.  Vitals reviewed.  Vitals:   10/25/16 1103  BP: 122/78  Pulse: 82  SpO2: 95%  Weight: 128 lb 6.4 oz (58.2 kg)  Height: 5\' 2"  (1.575 m)   Estimated body mass index is 23.48 kg/m as calculated from the following:   Height as of this encounter: 5\' 2"  (1.575 m).   Weight as of this encounter: 128 lb 6.4 oz (58.2 kg).     Assessment:       ICD-9-CM ICD-10-CM   1. Deep vein thrombosis (DVT) of distal vein of left lower extremity, unspecified chronicity (HCC) 453.42 I82.4Z2   2. 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 Preferred is Monday April 9th  Followup  -late April 2018/ early may 2018 for discussion of bronc results  > 50% of this > 25 min visit spent in face to face counseling or coordination of care  Dr. Brand Males, M.D., [facility].C.P Pulmonary and Critical Care Medicine Staff Physician [place] [facility] Pager: [phone], If no answer or between  15:00h - 7:00h: call [phone]  10/25/2016 11:43 AM

## 2016-11-20 ENCOUNTER — Other Ambulatory Visit: Payer: Self-pay | Admitting: Internal Medicine

## 2016-11-20 DIAGNOSIS — I519 Heart disease, unspecified: Secondary | ICD-10-CM

## 2016-11-28 ENCOUNTER — Ambulatory Visit (HOSPITAL_COMMUNITY)
Admission: RE | Admit: 2016-11-28 | Discharge: 2016-11-28 | Disposition: A | Discharge status: Home / Self Care | Payer: Medicare Other | Source: Ambulatory Visit | Attending: Internal Medicine | Admitting: Internal Medicine

## 2016-11-28 ENCOUNTER — Encounter (HOSPITAL_COMMUNITY): Payer: Self-pay | Admitting: Internal Medicine

## 2016-11-28 ENCOUNTER — Encounter (HOSPITAL_COMMUNITY)
Admission: RE | Disposition: A | Discharge status: Home / Self Care | Payer: Self-pay | Source: Ambulatory Visit | Attending: Internal Medicine

## 2016-11-28 DIAGNOSIS — Z87891 Personal history of nicotine dependence: Secondary | ICD-10-CM | POA: Insufficient documentation

## 2016-11-28 DIAGNOSIS — Z79899 Other long term (current) drug therapy: Secondary | ICD-10-CM | POA: Insufficient documentation

## 2016-11-28 DIAGNOSIS — R918 Other nonspecific abnormal finding of lung field: Secondary | ICD-10-CM | POA: Insufficient documentation

## 2016-11-28 DIAGNOSIS — Z886 Allergy status to analgesic agent status: Secondary | ICD-10-CM | POA: Insufficient documentation

## 2016-11-28 DIAGNOSIS — I10 Essential (primary) hypertension: Secondary | ICD-10-CM | POA: Diagnosis not present

## 2016-11-28 DIAGNOSIS — J479 Bronchiectasis, uncomplicated: Secondary | ICD-10-CM

## 2016-11-28 DIAGNOSIS — Z91040 Latex allergy status: Secondary | ICD-10-CM | POA: Diagnosis not present

## 2016-11-28 DIAGNOSIS — M65342 Trigger finger, left ring finger: Secondary | ICD-10-CM | POA: Diagnosis not present

## 2016-11-28 DIAGNOSIS — R0600 Dyspnea, unspecified: Secondary | ICD-10-CM | POA: Diagnosis not present

## 2016-11-28 DIAGNOSIS — Z7982 Long term (current) use of aspirin: Secondary | ICD-10-CM | POA: Insufficient documentation

## 2016-11-28 DIAGNOSIS — E890 Postprocedural hypothyroidism: Secondary | ICD-10-CM | POA: Insufficient documentation

## 2016-11-28 DIAGNOSIS — R848 Other abnormal findings in specimens from respiratory organs and thorax: Secondary | ICD-10-CM | POA: Diagnosis not present

## 2016-11-28 DIAGNOSIS — Z888 Allergy status to other drugs, medicaments and biological substances status: Secondary | ICD-10-CM | POA: Diagnosis not present

## 2016-11-28 DIAGNOSIS — Z86718 Personal history of other venous thrombosis and embolism: Secondary | ICD-10-CM | POA: Insufficient documentation

## 2016-11-28 DIAGNOSIS — F419 Anxiety disorder, unspecified: Secondary | ICD-10-CM | POA: Insufficient documentation

## 2016-11-28 DIAGNOSIS — Z885 Allergy status to narcotic agent status: Secondary | ICD-10-CM | POA: Diagnosis not present

## 2016-11-28 HISTORY — PX: VIDEO BRONCHOSCOPY: SHX5072

## 2016-11-28 LAB — BODY FLUID CELL COUNT WITH DIFFERENTIAL
Eos, Fluid: 0 %
LYMPHS FL: 17 %
MONOCYTE-MACROPHAGE-SEROUS FLUID: 57 % (ref 50–90)
NEUTROPHIL FLUID: 26 % — AB (ref 0–25)
WBC FLUID: 45 uL (ref 0–1000)

## 2016-11-28 SURGERY — VIDEO BRONCHOSCOPY WITHOUT FLUORO
Anesthesia: Moderate Sedation | Laterality: Bilateral

## 2016-11-28 MED ORDER — SODIUM CHLORIDE 0.9 % IV SOLN
INTRAVENOUS | Status: DC
  Administered 2016-11-28: 15:00:00 via INTRAVENOUS

## 2016-11-28 MED ORDER — MIDAZOLAM HCL 10 MG/2ML IJ SOLN
INTRAMUSCULAR | Status: DC | PRN
  Administered 2016-11-28: 1 mg via INTRAVENOUS
  Administered 2016-11-28 (×3): 2 mg via INTRAVENOUS

## 2016-11-28 MED ORDER — LIDOCAINE HCL (PF) 1 % IJ SOLN
INTRAMUSCULAR | Status: DC | PRN
  Administered 2016-11-28: 6 mL

## 2016-11-28 MED ORDER — PHENYLEPHRINE HCL 0.25 % NA SOLN: 1.0000 | Freq: Four times a day (QID) | NASAL | Status: DC | PRN

## 2016-11-28 MED ORDER — MIDAZOLAM HCL 5 MG/ML IJ SOLN
INTRAMUSCULAR | Status: AC
  Filled 2016-11-28: qty 2

## 2016-11-28 MED ORDER — LIDOCAINE HCL 2 % EX GEL
CUTANEOUS | Status: DC | PRN
  Administered 2016-11-28: 1

## 2016-11-28 MED ORDER — PHENYLEPHRINE HCL 0.25 % NA SOLN
NASAL | Status: DC | PRN
  Administered 2016-11-28: 2 via NASAL

## 2016-11-28 MED ORDER — LIDOCAINE HCL 2 % EX GEL: 1.0000 "application " | Freq: Once | CUTANEOUS | Status: DC

## 2016-11-28 MED ORDER — BUTAMBEN-TETRACAINE-BENZOCAINE 2-2-14 % EX AERO: 1.0000 | INHALATION_SPRAY | Freq: Once | CUTANEOUS | Status: DC

## 2016-11-28 MED ORDER — FENTANYL CITRATE (PF) 100 MCG/2ML IJ SOLN
INTRAMUSCULAR | Status: DC | PRN
  Administered 2016-11-28: 50 ug via INTRAVENOUS
  Administered 2016-11-28: 25 ug via INTRAVENOUS
  Administered 2016-11-28: 50 ug via INTRAVENOUS
  Administered 2016-11-28: 25 ug via INTRAVENOUS

## 2016-11-28 MED ORDER — FENTANYL CITRATE (PF) 100 MCG/2ML IJ SOLN
INTRAMUSCULAR | Status: AC
  Filled 2016-11-28: qty 4

## 2016-11-28 NOTE — Discharge Instructions (Signed)
Flexible Bronchoscopy, Care After These instructions give you information on caring for yourself after your procedure. Your doctor may also give you more specific instructions. Call your doctor if you have any problems or questions after your procedure. Follow these instructions at home:  Do not eat or drink anything for 2 hours after your procedure. If you try to eat or drink before the medicine wears off, food or drink could go into your lungs. You could also burn yourself.  After 2 hours have passed and when you can cough and gag normally, you may eat soft food and drink liquids slowly.  The day after the test, you may eat your normal diet.  You may do your normal activities.  Keep all doctor visits. Get help right away if:  You get more and more short of breath.  You get light-headed.  You feel like you are going to pass out (faint).  You have chest pain.  You have new problems that worry you.  You cough up more than a little blood.  You cough up more blood than before. This information is not intended to replace advice given to you by your health care provider. Make sure you discuss any questions you have with your health care provider. Document Released: 06/05/2009 Document Revised: 01/14/2016 Document Reviewed: 04/12/2013 Elsevier Interactive Patient Education  2017 [place] not eat or drink until after 5:30 today 11/28/16   FOLLOWUP Future Appointments Date Time Provider [place]  12/20/2016 9:00 AM Brand Males, MD [facility] None  12/26/2016 1:30 PM [facility] HS VASC 1 [facility]  12/26/2016 2:15 PM Serafina Mitchell, MD [facility]

## 2016-11-28 NOTE — Op Note (Signed)
Name:  Gina Lyons MRN:  [id number] DOB:  21-Nov-1945  PROCEDURE NOTE  Procedure(s): Flexible bronchoscopy [phone]) Bronchial alveolar lavage [phone] of the right middle lobe  Indications:  Dyspnea, pulmonary infiltrates and bilateral bronchiectasis new  Consent:  Procedure, benefits, risks and alternatives discussed.  Questions answered.  Consent obtained.  Anesthesia:  Moderate Sedation  Location: [facility] bronch suite   Procedure summary:  Appropriate equipment was assembled.  The patient was brought to the procedure suite room and identified as Gina Lyons with [birth year]  Safety timeout was performed. The patient was placed supine on the  table, airway and moderate sedation administered by this operator  After the appropriate level of moderation was assured, flexible video bronchoscope was lubricated and inserted through the endotracheal tube.  Total of 11 mL of 1% Lidocaine were administered through the bronchoscope to augment moderate sedation  Airway examination was YES performed bilaterally to subsegmental level.  Minimal clear secretions were noted, mucosa appeared normal and NO endobronchial lesions were identified.  Bronchial alveolar lavage of the right middle lobe medial segment was performed with 120 mL of normal saline in 37m aliquots x 4 number of times. Total return of 65 mL of dark grey fluid, after which the bronchoscope was withdrawn.  The patient was recovered and then  transferred to recovery area  Post-procedure chest x-ray was NOT required and so  ordered.  Specimens sent: Bronchial alveolar lavage specimen of the right middle lobe for cell count (including CD4/CD8 assessment), microbiology and cytology.  Complications:  No immediate complications were noted.  Hemodynamic parameters and oxygenation remained stable throughout the procedure.  Estimated blood loss:  none  IMPRESSION 1. Normal airways 2. Right middle lobe  lavage  Followup   Future Appointments Date Time Provider [place]anks 12/20/2016 9:00 AM MBrand Males MD [facility] None  12/26/2016 1:30 PM [facility] HS VASC 1 [facility]  12/26/2016 2:15 PM VSerafina Mitchell MD [facility]     Dr. MBrand Males M.D., FMedinasummit [facility] CenterC.P Pulmonary and Critical Care Medicine Staff Physician [place] Pager: [phone] If no answer or between  15:00h - 7:00h: call [phone]  11/28/2016 3:40 PM

## 2016-11-28 NOTE — Progress Notes (Signed)
Video bronchoscopy performed Intervention bronchial washings Pt tolerated well  Starlena Beil David RRT  

## 2016-11-28 NOTE — H&P (Signed)
Gina Lyons is an 71 y.o. female.   Chief Complaint: prre-bronch BAL eval for dyspnea and pulmonary infiltarates HPI: unexplained dyspnea with RML an lingula infiltrate with mild bronchiectasis of MAI pattern. Here for bronch with BAL . Risks of pneumothorax, hemothorax, sedation/anesthesia complications such as cardiac or respiratory arrest or hypotension, stroke and bleeding all explained. Benefits of diagnosis but limitations of non-diagnosis also explained. Patient verbalized understanding and wished to proceed.     Past Medical History:  Diagnosis Date  . Anxiety    "just w/this breathing problem" (03/30/2016)  . Arthritis    "back" (03/30/2016)  . Chronic lower back pain   . Diastolic dysfunction    a. 03/2016 Echo: EF 50-55%, no rwma, GR1 DD, mild AI, triv MR/TR, PASP 42mHg.  .Marland KitchenDVT (deep venous thrombosis) ([place]    a. 02/2016 LLE DVT-->Xarelto, switched to Eliquis 03/2016 b/c pt felt that xarelto may have been causing shortness of breath.  . Dyspnea on exertion    a. 03/08/2016, 03/19/2016, 03/30/2016 CTA Chest: No PE;  b. 03/2016 V:Q Scan: Intermediate probability for PE;  c. 05/2016 Pulmonary Stress Test: Excellent effort, nl VO2 max and anaerobic threshold, flattening of O2 pulse curve associated with tachypnea and dead space ventilation and approaching ventilatory limits. No desat. Likely combined cardiac and pulmonary limitations.  . Family history of adverse reaction to anesthesia    "mom had hard time to wake up"  . History of blood transfusion 1964   "I hemorrhaged after my baby was born"  . Hypertension   . Hypothyroidism   . Migraine    "under control w/RX" (03/30/2016)  . Pneumonia 02/2016   "didn't know I had it" (03/30/2016)  . 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 BACK SURGERY    . BILATERAL SALPINGOOPHORECTOMY Bilateral   . CARPAL TUNNEL RELEASE Right   . DILATION AND CURETTAGE OF UTERUS    .  HEMORRHOID SURGERY    . [place]GERY  2013?  .Marland KitchenPOSTERIOR CERVICAL FUSION/FORAMINOTOMY    . REFRACTIVE SURGERY Bilateral   . TOTAL THYROIDECTOMY  ?1974  . VAGINAL HYSTERECTOMY      Family History  Problem Relation Age of Onset  . Cancer Sister     brain tumor  . Cancer Brother     unknown ca  . Kidney disease Mother   . COPD Father     smoker   Social History:  reports that she quit smoking about 41 years ago. Her smoking use included Cigarettes. She has a 3.00 pack-year smoking history. She has never used smokeless tobacco. She reports that she does not drink alcohol or use drugs.  Allergies:  Allergies  Allergen Reactions  . Imitrex [Sumatriptan] Other (See Comments)    syncope  . Latex     Blisters from prolonged exposure to latex  . Celebrex [Celecoxib] Other (See Comments)    palpations   . Codeine Itching  . Percocet [Oxycodone-Acetaminophen] Itching    Medications Prior to Admission  Medication Sig Dispense Refill  . amLODipine (NORVASC) 5 MG tablet Take 5 mg by mouth at bedtime.    .Marland Kitchenaspirin EC 81 MG tablet Take 81 mg by mouth daily.    . Biotin 5 MG CAPS Take 5 mg by mouth daily.    . diazepam (VALIUM) 10 MG tablet Take 10 mg by mouth at bedtime.     .Marland Kitchendoxylamine, Sleep, (SLEEP  AID) 25 MG tablet Take 25 mg by mouth at bedtime.    . fexofenadine (ALLEGRA) 180 MG tablet Take 180 mg by mouth daily.    . furosemide (LASIX) 20 MG tablet TAKE 1 TABLET BY MOUTH DAILY 30 tablet 1  . levothyroxine (SYNTHROID, LEVOTHROID) 112 MCG tablet Take 112 mcg by mouth daily before breakfast. Brand name only**    . naproxen (NAPROSYN) 500 MG tablet Take 500 mg by mouth 2 (two) times daily as needed for mild pain or moderate pain.   1  . sodium chloride (OCEAN) 0.65 % SOLN nasal spray Place 1 spray into both nostrils at bedtime as needed for congestion.    . traMADol (ULTRAM) 50 MG tablet Take 50 mg by mouth at bedtime as needed for pain.  0    No results found for this or  any previous visit (from the past 48 hour(s)). No results found.  ROS  Per hpi  Blood pressure (!) 153/84, temperature 97.9 F (36.6 C), temperature source Oral, resp. rate 12, SpO2 100 %. Physical Exam  Alert and oriented x 3 Speech normal CTA bilateralll; No edema Normal heart sounds Soft abd   Assessment/Plan Pulmonary infiltrate Lingula and RML bronchiectasis Dyspnea  PLAN Flexibile video bronch with BAL   Dr. Brand Males, M.D., [facility].C.P Pulmonary and Critical Care Medicine Staff Physician [place] [facility] Pager: [phone], If no answer or between  15:00h - 7:00h: call [phone]  11/28/2016 3:15 PM

## 2016-11-29 ENCOUNTER — Encounter (HOSPITAL_COMMUNITY): Payer: Self-pay | Admitting: Internal Medicine

## 2016-11-29 LAB — PNEUMOCYSTIS JIROVECI SMEAR BY DFA: Pneumocystis jiroveci Ag: NEGATIVE

## 2016-11-29 LAB — ACID FAST SMEAR (AFB): ACID FAST SMEAR - AFSCU2: NEGATIVE

## 2016-11-30 ENCOUNTER — Telehealth: Payer: Self-pay | Admitting: Internal Medicine

## 2016-11-30 DIAGNOSIS — I519 Heart disease, unspecified: Secondary | ICD-10-CM

## 2016-11-30 MED ORDER — FUROSEMIDE 20 MG PO TABS: 20.0000 mg | ORAL_TABLET | Freq: Every day | ORAL | 1 refills | Status: DC

## 2016-11-30 NOTE — Telephone Encounter (Signed)
Spoke with pharmacy-look[facility] and was told they did not have on file.    Gave verbal to pharmacist and nothing more needed at this time. They will contact patient to let her know Rx is ready.

## 2016-12-01 LAB — CULTURE, BAL-QUANTITATIVE W GRAM STAIN: Special Requests: NORMAL

## 2016-12-01 LAB — CULTURE, BAL-QUANTITATIVE

## 2016-12-02 LAB — MTB NAA WITHOUT AFB CULTURE: SOURCE: NEGATIVE

## 2016-12-14 ENCOUNTER — Encounter: Payer: Self-pay | Admitting: Surgery

## 2016-12-15 DIAGNOSIS — I10 Essential (primary) hypertension: Secondary | ICD-10-CM | POA: Diagnosis not present

## 2016-12-15 DIAGNOSIS — M545 Low back pain: Secondary | ICD-10-CM | POA: Diagnosis not present

## 2016-12-15 DIAGNOSIS — R5383 Other fatigue: Secondary | ICD-10-CM | POA: Diagnosis not present

## 2016-12-15 DIAGNOSIS — G8929 Other chronic pain: Secondary | ICD-10-CM | POA: Diagnosis not present

## 2016-12-16 DIAGNOSIS — I10 Essential (primary) hypertension: Secondary | ICD-10-CM | POA: Diagnosis not present

## 2016-12-16 DIAGNOSIS — R5383 Other fatigue: Secondary | ICD-10-CM | POA: Diagnosis not present

## 2016-12-19 DIAGNOSIS — R232 Flushing: Secondary | ICD-10-CM | POA: Diagnosis not present

## 2016-12-20 ENCOUNTER — Encounter: Payer: Self-pay | Admitting: Internal Medicine

## 2016-12-20 ENCOUNTER — Ambulatory Visit (INDEPENDENT_AMBULATORY_CARE_PROVIDER_SITE_OTHER): Payer: Medicare Other | Admitting: Internal Medicine

## 2016-12-20 ENCOUNTER — Other Ambulatory Visit: Payer: Self-pay | Admitting: Surgery

## 2016-12-20 DIAGNOSIS — R0602 Shortness of breath: Secondary | ICD-10-CM

## 2016-12-20 DIAGNOSIS — R918 Other nonspecific abnormal finding of lung field: Secondary | ICD-10-CM

## 2016-12-20 DIAGNOSIS — M25569 Pain in unspecified knee: Secondary | ICD-10-CM

## 2016-12-20 DIAGNOSIS — R600 Localized edema: Secondary | ICD-10-CM

## 2016-12-20 NOTE — Patient Instructions (Signed)
Pulmonary infiltrate Lavage without infection  Follow up ct chest from march 2018 with repeat  ct chest in 6 months from 12/20/2016   SOB (shortness of breath) Likely due to deconditioning, mild lung inflammation and stiff heart muscle  Plan Start exercising   followup 6 months; if problematic can refer to [facility]

## 2016-12-20 NOTE — Progress Notes (Signed)
Subjective:     Patient ID: Gina Lyons, female   DOB: 1945/12/18, 71 y.o.   MRN: [id number]  HPI     71 year old female former smoker, only 3 pack year history, and for pulmonary consult July 2017 for DVT (No PE on CT chest x 3 ) treated with Xarelto.   TEST Gina Lyons.  Venous Doppler, 03/04/2016 showed DVT in the short segment of left peroneal vein CTA chest , 7/18 2017 negative for pulmonary embolism, bibasilar sub-solid pulmonary nodules measuring up to 7 mm CTA chest 03/19/2016 negative for pulmonary embolism, stable dilation of the ascending aorta CTA chest 03/30/2016 negative for pulmonary emboli, bilateral ground glass opacities, nodular opacities within the lingula, right middle lobe and right lower lobe VQ scan 03/31/16 bilateral patchy matched ventilation/perfusion defects, intermediate  probability for pulmonary embolus. 2D echo 03/31/16>EF 50-55%, gr 1 DD , PAP 72mHg Pulmonary function test on 03/31/2016 showed a FEV1 at 107%, ratio 77, FVC of 105%, no significant bronchodilator response, DLCO 63%  04/12/2016 post hospital follow-up Patient presents for a post hospital follow-up. It was diagnosed with a DVT on the left 03/04/2016. She was started on Xarelto. She was on Estradiol and she stopped this. CT chest on July 18 was negative for PE. Patient had 3 weeks of progressive shortness of breath She was admitted on 03/30/2016. Patient was admitted August 9 through 04/01/2016 for progressive shortness of breath. CT chest was done that was negative for pulmonary embolism. It did show bilateral groundglass opacities  and nodular opacities within the lingula, right middle lobe and right lower lobe. [place] and [place]. Patient did undergo gentle diuresis. She was also treated with Levaquin for questionable atypical infection. A VQ scan was done that showed bilateral patchy matched ventilation and perfusion defects with intermittent probability for  pulmonary embolism. She says she was told she had a PE.from this .   A 2-D echo showed a normal EF at 50-55% with grade 1 diastolic dysfunctions and pulmonary artery pressures at 25 m of mercury. Pulmonary function test showed normal lung function with an FEV1 at 107%. Patient did have a diffusing defect =DLCO 63%.  Autoimmune and CTD workup was neg (HIV, ANCA, GBM, CCP , dsDNA ab , ANA )  Discharge. Patient is feeling some better but still gets winded with walking. No dyspnea at rest .  She denies cough, hemoptysis , abd pain, n/v./d. She does have some soreness along left LE.  Says prior to admit she was very active with yard work , cycling and house work. No dyspnea at all.  Prior to admit no long travel, injury , surgery . No hx of DVT/PE . No FH of DVT/PE.  No hx of cancer.     OV 05/13/2016  Chief Complaint  Patient presents with  . Follow-up    Pt here after CXR and seeing TP on 8.22.17. Pt states her SOB has not improved since also OV. PT c/o DOE, intermittent dry cough, chest tightness when SOB and resolves when she lays down. Pt states she had mild left foot swelling on 9.21.17, this has resolved. Pt denies f/c/s.     [name]89year old female with very limited remote smoking. In July 2017 she was admitted with left-sided DVT and was started on Xarelto. Sometime after that according to her history she started developing shortness of breath which has persisted to this day despite changes with Xarelto to es.E;liquis. This dyspnea is pervasive. It is moderate  to severe in intensity. It is present for exertion and relieved by rest. Class II-III levels of activity makes her dyspneic. There is no orthopnea paroxysmal nocturnal dyspnea. She had 3 CT scans of the chest in July 2017 that ruled out pulmonary embolism but in August 2017 she did have some new groundglass opacities. August 2017 VQ scan was of intermediate probability. Only function does show reduction in diffusion capacity  suggesting the groundglass opacities might be real. She did have autoimmune workup in August 2017 along with complement and vascular this workup that was normal. She presents today with her husband. She is really frustrated by her dyspnea. Chest x-ray personally visualized shows some nonspecific scarring. She is not able to do her household activities.  she has a dry cough of mild intensity for the last 6 months. Cough preceded onset of dyspnea and DVT. At last visit they specifically denied this cough. This no associated wheezing or orthopnea paroxysmal nocturnal dyspnea or chills.    Exhaled nitric oxide testing today in the office 05/13/2016: 23 ppb and normal   Walking desaturation test on 05/13/2016 185 feet x 3 laps:   did NOT desaturate. Rest pulse ox was 100%, final pulse ox was 100% was the final pulse ox w heart rate response was at rest78. HR response  to 94/min at peak exertion.    Noted SCL 70 not done (no dysphagia)  D dimer this visti 0.36 and n  ormal  Dg Chest 2 View  Result Date: 05/13/2016 CLINICAL DATA:  Follow-up pulmonary infiltrates. EXAM: CHEST  2 VIEW COMPARISON:  04/07/2016 and 03/31/2016 FINDINGS: Mild hyperinflation is stable. Stable linear densities in the anterior left lung. No significant airspace disease or consolidation. Heart and mediastinum are within normal limits and stable. Surgical plate in the lower cervical spine. Trachea is midline. No large pleural effusions. Old left seventh rib fracture. IMPRESSION: Stable linear densities in the left lung are probably related to scarring. No acute cardiopulmonary disease. Electronically Signed   By: Markus Daft M.D.   On: 05/13/2016 14:25       OV 06/01/2016  Chief Complaint  Patient presents with  . Follow-up    Pt here after CPST. Pt states her SOB has slightly improved since last OV. Pt c/o dry cough and occassional chest tightness - under breasts after eating dinner in evening. Pt denies chest congestion  and f/c/s.     Dyspnea evaluation. Here for review of pulmonary stress test. She underwent pulmonary stress test 05/26/2016. She gave an excellent effort. She had normal VO2 max and anaerobic threshold. Nevertheless there is a flattening of her O2 pulse curve that is significant. This is associated with tachypnea and dead space ventilation and approaching ventilatory limits. Pulse ox did not go down. In summary this is a combinati    She is reporting weight gain since a deep vein thrombosis. So far pulmonary embolism has never been confirmed. In fact the CT chest in October 2017 only shows groundglass opacities. Echocardiogram shows diastolic dysfunction. In addition after coming off her estrogen which she was taking for many years [in July 2017 after DVT] she is reporting significant hot flashes which are frustrating her. She continues to suffer from dyspnea and is frustrated by this as well.     OV 08/10/2016  Chief Complaint  Patient presents with  . Follow-up    Pt states overall her SOB has improved but has bad days every now and then. Pt c/o more SOB today and  occassional dry cough with some  frank blood - size of penny. Pt also states she has chest tightness when she is SOB - this resolves with rest.     Follow-up dyspnea: We finally determined that this dyspnea was unlikely due to pulmonary embolism. Was related to diastolic dysfunction and pulmonary groundglass infiltrates sustained in July 2017 at the time of deep vein thrombosis diagnosis. Last visit October 2017 I started Lasix therapy. Since then with the passage of time and Lasix therapy she has improved with her dyspnea. She still does have mild dyspnea on exertion. She has not attended pulmonary rehabilitation. She does not want to go to pulmonary habitation due to logistical issues and time issues. She and her husband said they will start exercising on a road bike for an exercise bike by themselves gradually.  In terms of deep  vein thrombosis related to estrogen intake.. She continues to be on anticoagulation since July 2017. She's completed 5 months of therapy. Interim d-dimer  normal. She has no bleeding complications. She wants to stop anticoagulation.  OV 10/25/2016  Chief Complaint  Patient presents with  . Follow-up    Pt here after HRCT. pt states her breathing is unchanged since last OV. Pt denies cough and CP/tightness.     Follow-up dyspnea: At last visit she told me that with Lasix therapy that her dyspnea improved. Today she tells me that she has now plateaued in terms of improvement. She still has dyspnea on exertion relieved by rest. The Lasix is helping but without the Lasix it is worse. It at the same time she feels she has not returned to her baseline pre-summer 2017. High-resolution CT scan of the chest done today and personally visualized shows the following abnormalities described suggestive of MAI infection  In terms of the left lower extremity DVT: In December 2017 we stopped anticoag based on normal D-dimer. She says since summer 2017 continues with daily left pain that sarts in ankle and spreads up to popliteal area along the venous course. On February 2018 on the 21st she had Doppler ultrasound of the left lower extremity that did not show any DVT. She also had chemistries that are normal. She takes Naprosyn once daily for this. She says all this is new since the DVT. She is frustrated by the pain. She says that primary care has deferred this issue to me also notices associated edema. She is wondering about vascular insufficiency and is requesting whether she should be referred to a vascular evaluation  Ct Chest High Resolution  Result Date: 10/25/2016 CLINICAL DATA:  Dyspnea and wheezing. Former smoker. History of pulmonary embolism and DVT. Follow-up abnormal chest CT. EXAM: CT CHEST WITHOUT CONTRAST TECHNIQUE: Multidetector CT imaging of the chest was performed following the standard protocol  without intravenous contrast. High resolution imaging of the lungs, as well as inspiratory and expiratory imaging, was performed. COMPARISON:  03/30/2016 chest CT angiogram. 07/08/2016 chest radiograph. FINDINGS: Cardiovascular: Normal heart size. No significant pericardial fluid/thickening. Left anterior descending coronary atherosclerosis. Atherosclerotic thoracic aorta with stable mild ectasia of the ascending thoracic aorta (maximum diameter 3.9 cm). Normal caliber pulmonary arteries. Mediastinum/Nodes: Status post left hemithyroidectomy. No discrete right thyroid lobe nodules. Unremarkable esophagus. No pathologically enlarged axillary, mediastinal or gross hilar lymph nodes, noting limited sensitivity for the detection of hilar adenopathy on this noncontrast study. Lungs/Pleura: No pneumothorax. No pleural effusion. There is mild-to-moderate cylindrical and varicoid bronchiectasis extensively distributed throughout both lungs, most prominent in the basilar right upper lobe,  right middle lobe and lingula. There is extensive patchy tree-in-bud and centrilobular nodularity throughout both lungs at the areas of bronchiectasis, which appears overall mildly worsened since 03/30/2016, including a new dominant nodular 13 x 11 mm opacity in the medial dependent right lower lobe (series 3/ image 76). Bandlike subpleural opacities at the areas of bronchiectasis in the basilar right upper lobe, medial segment right middle lobe and lingula are stable and compatible with postinfectious scars. Otherwise no acute consolidative airspace disease or lung masses. Mild centrilobular emphysema. Mild patchy air trapping in both lungs on the expiration sequence. No frank honeycombing. Upper abdomen: Partially visualized left parapelvic renal cysts. Musculoskeletal: No aggressive appearing focal osseous lesions. Partially visualized surgical hardware from ACDF in the lower cervical spine. Stable T9 vertebral hemangioma. Mild thoracic  spondylosis. IMPRESSION: 1. 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 Findings have mildly worsened in the interval since 03/30/2016, including a new dominant 13 mm nodular opacity in the right lower lobe. Recommend initial follow-up post treatment high-resolution chest CT in 6 months. 2. Mild patchy air trapping in both lungs, suggesting small airways disease. 3. Aortic atherosclerosis.  One vessel coronary atherosclerosis. Electronically Signed   By: Ilona Sorrel M.D.   On: 10/25/2016 10:07    OV 12/20/2016  Chief Complaint  Patient presents with  . Follow-up    Pt here after bronch. Pt states her breathing is unchanged since last OV. Pt c/o dry cough and chest heaviness. Pt denies f/c/s and CP/tightness.    Follow-up pulmonary infiltrates not otherwise specified: She underwent bronchoscopy with bronchoalveolar lavage of July 2018 which showed 57% monocytes and 26% neutrophils. Cultures are negative except for colonizing contaminant mold ; low colony count. Last CT scan was March 2018  Follow-up dyspnea: She continues to have dyspnea. She continues to insist that is all after the DVT. There are no new issues. She is planning to exercise [place]   has a past medical history of Anxiety; Arthritis; Chronic lower back pain; Diastolic dysfunction; DVT (deep venous thrombosis) ([place]); Dyspnea on exertion; Family history of adverse reaction to anesthesia; History of blood transfusion (1964); Hypertension; Hypothyroidism; Migraine; Pneumonia (02/2016); and Tumor, thyroid.   reports that she quit smoking about 41 years ago. Her smoking use included Cigarettes. She has a  3.00 pack-year smoking history. She has never used smokeless tobacco.  Past Surgical History:  Procedure Laterality Date  . BACK SURGERY    . BILATERAL SALPINGOOPHORECTOMY Bilateral   . CARPAL TUNNEL RELEASE Right   . DILATION AND CURETTAGE OF UTERUS    . HEMORRHOID SURGERY    . [place] SURGERY  2013?  Marland Kitchen POSTERIOR CERVICAL FUSION/FORAMINOTOMY    . REFRACTIVE SURGERY Bilateral   . TOTAL THYROIDECTOMY  ?1974  . VAGINAL HYSTERECTOMY    . VIDEO BRONCHOSCOPY Bilateral 11/28/2016   Procedure: VIDEO BRONCHOSCOPY WITHOUT FLUORO;  Surgeon: Brand Males, MD;  Location: [facility];  Service: Cardiopulmonary;  Laterality: Bilateral;    Allergies  Allergen Reactions  . Imitrex [Sumatriptan] Other (See Comments)    syncope  . Latex     Blisters from prolonged exposure to latex  . Celebrex [Celecoxib] Other (See Comments)    palpations   . Codeine Itching  . Percocet [Oxycodone-Acetaminophen] Itching  Immunization History  Administered Date(s) Administered  . Influenza, High Dose Seasonal PF 04/12/2016  . Pneumococcal Polysaccharide-23 03/31/2016    Family History  Problem Relation Age of Onset  . Cancer Sister     brain tumor  . Cancer Brother     unknown ca  . Kidney disease Mother   . COPD Father     smoker     Current Outpatient Prescriptions:  .  amLODipine (NORVASC) 5 MG tablet, Take 5 mg by mouth at bedtime., Disp: , Rfl:  .  aspirin EC 81 MG tablet, Take 81 mg by mouth daily., Disp: , Rfl:  .  Biotin 5 MG CAPS, Take 5 mg by mouth daily., Disp: , Rfl:  .  diazepam (VALIUM) 10 MG tablet, Take 10 mg by mouth at bedtime. , Disp: , Rfl:  .  doxylamine, Sleep, (SLEEP AID) 25 MG tablet, Take 25 mg by mouth at bedtime., Disp: , Rfl:  .  fexofenadine (ALLEGRA) 180 MG tablet, Take 180 mg by mouth daily., Disp: , Rfl:  .  furosemide (LASIX) 20 MG tablet, Take 1 tablet (20 mg total) by mouth daily., Disp: 30 tablet, Rfl: 1 .  levothyroxine (SYNTHROID, LEVOTHROID) 112  MCG tablet, Take 112 mcg by mouth daily before breakfast. Brand name only**, Disp: , Rfl:  .  naproxen (NAPROSYN) 500 MG tablet, Take 500 mg by mouth 2 (two) times daily as needed for mild pain or moderate pain. , Disp: , Rfl: 1 .  sodium chloride (OCEAN) 0.65 % SOLN nasal spray, Place 1 spray into both nostrils at bedtime as needed for congestion., Disp: , Rfl:  .  traMADol (ULTRAM) 50 MG tablet, Take 50 mg by mouth at bedtime as needed for pain., Disp: , Rfl: 0 .  FLUoxetine (PROZAC) 10 MG capsule, Take 1 capsule by mouth daily., Disp: , Rfl:     Review of Systems     Objective:   Physical Exam  Constitutional: She is oriented to person, place, and time. She appears well-developed and well-nourished. No distress.  HENT:  Head: Normocephalic and atraumatic.  Right Ear: External ear normal.  Left Ear: External ear normal.  Mouth/Throat: Oropharynx is clear and moist. No oropharyngeal exudate.  Eyes: Conjunctivae and EOM are normal. Pupils are equal, round, and reactive to light. Right eye exhibits no discharge. Left eye exhibits no discharge. No scleral icterus.  Neck: Normal range of motion. Neck supple. No JVD present. No tracheal deviation present. No thyromegaly present.  Cardiovascular: Normal rate, regular rhythm, normal heart sounds and intact distal pulses.  Exam reveals no gallop and no friction rub.   No murmur heard. Pulmonary/Chest: Effort normal and breath sounds normal. No respiratory distress. She has no wheezes. She has no rales. She exhibits no tenderness.  Abdominal: Soft. Bowel sounds are normal. She exhibits no distension and no mass. There is no tenderness. There is no rebound and no guarding.  Musculoskeletal: Normal range of motion. She exhibits no edema or tenderness.  Lymphadenopathy:    She has no cervical adenopathy.  Neurological: She is alert and oriented to person, place, and time. She has normal reflexes. No cranial nerve deficit. She exhibits normal muscle  tone. Coordination normal.  Skin: Skin is warm and dry. No rash noted. She is not diaphoretic. No erythema. No pallor.  Psychiatric: She has a normal mood and affect. Her behavior is normal. Judgment and thought content normal.  Vitals reviewed.  Vitals:   12/20/16 0858  BP: 130/78  Pulse:  71  SpO2: 98%  Weight: 127 lb (57.6 kg)  Height: _0  (1.575 m)    Estimated body mass index is 23.23 kg/m as calculated from the following:   Height as of this encounter: _1  (1.575 m).   Weight as of this encounter: 127 lb (57.6 kg).      Assessment:       ICD-9-CM ICD-10-CM   1. Pulmonary infiltrate 793.19 R91.8   2. SOB (shortness of breath) 786.05 R06.02        Plan:     Pulmonary infiltrate Lavage without infection  Follow up ct chest from march 2018 with repeat  ct chest in 6 months from 12/20/2016   SOB (shortness of breath) Likely due to deconditioning, mild lung inflammation and stiff heart muscle  Plan Start exercising   followup 6 months; if problematic can refer to [facility]    Dr. Brand Males, M.D., [facility].C.P Pulmonary and Critical Care Medicine Staff Physician [place] [facility] Pager: [phone], If no answer or between  15:00h - 7:00h: call [phone]  12/20/2016 9:24 AM

## 2016-12-20 NOTE — Assessment & Plan Note (Addendum)
Lavage without infection  Follow up ct chest from march 2018 with repeat  ct chest in 6 months from 12/20/2016

## 2016-12-20 NOTE — Assessment & Plan Note (Signed)
Likely due to deconditioning, mild lung inflammation and stiff heart muscle  Plan Start exercising   followup 6 months; if problematic can refer to [facility]

## 2016-12-26 ENCOUNTER — Ambulatory Visit (HOSPITAL_COMMUNITY)
Admission: RE | Admit: 2016-12-26 | Discharge: 2016-12-26 | Disposition: A | Discharge status: Home / Self Care | Payer: Medicare Other | Source: Ambulatory Visit | Attending: Surgery | Admitting: Surgery

## 2016-12-26 ENCOUNTER — Ambulatory Visit (INDEPENDENT_AMBULATORY_CARE_PROVIDER_SITE_OTHER): Payer: Medicare Other | Admitting: Surgery

## 2016-12-26 ENCOUNTER — Encounter: Payer: Self-pay | Admitting: Surgery

## 2016-12-26 VITALS — BP 139/87 | HR 67 | Temp 97.1°F | Resp 18 | Ht 62.0 in | Wt 125.9 lb

## 2016-12-26 DIAGNOSIS — M25569 Pain in unspecified knee: Secondary | ICD-10-CM | POA: Diagnosis not present

## 2016-12-26 DIAGNOSIS — M25562 Pain in left knee: Secondary | ICD-10-CM | POA: Diagnosis not present

## 2016-12-26 DIAGNOSIS — R600 Localized edema: Secondary | ICD-10-CM | POA: Insufficient documentation

## 2016-12-26 DIAGNOSIS — I82442 Acute embolism and thrombosis of left tibial vein: Secondary | ICD-10-CM

## 2016-12-26 NOTE — Progress Notes (Signed)
[facility] [place]  Patient name: Gina Lyons MRN: [id number] DOB: 1945-09-01 Sex: female   REFERRING PROVIDER:    Dr. Chase Caller   REASON FOR CONSULT:    Swelling and leg pain  HISTORY OF PRESENT ILLNESS:   Gina Lyons is a 71 y.o. female, who is Referred today for evaluation of vascular insufficiency.  The patient has a history of superficial venous thrombosis in the left great saphenous vein and October 2016.  On 03/04/2016 she had a DVT involving a short segment of the left peroneal vein.  She was treated with anticoagulation.  She states that she now gets pain that she describes as a burning stabbing feeling like something is taking a bite out of her, around the ankle and calf area.  She does not endorse any claudication like symptoms.  She has worn compression stockings with no benefit.  The patient is also suffering from shortness of breath which is being evaluated by pulmonary.  This appears to be improving with exercise.  PAST MEDICAL HISTORY    Past Medical History:  Diagnosis Date  . Anxiety    "just w/this breathing problem" (03/30/2016)  . Arthritis    "back" (03/30/2016)  . Chronic lower back pain   . Diastolic dysfunction    a. 03/2016 Echo: EF 50-55%, no rwma, GR1 DD, mild AI, triv MR/TR, PASP 38mmHg.  Marland Kitchen DVT (deep venous thrombosis) ([place])    a. 02/2016 LLE DVT-->Xarelto, switched to Eliquis 03/2016 b/c pt felt that xarelto may have been causing shortness of breath.  . DVT (deep venous thrombosis) ([place])   . Dyspnea on exertion    a. 03/08/2016, 03/19/2016, 03/30/2016 CTA Chest: No PE;  b. 03/2016 V:Q Scan: Intermediate probability for PE;  c. 05/2016 Pulmonary Stress Test: Excellent effort, nl VO2 max and anaerobic threshold, flattening of O2 pulse curve associated with tachypnea and dead space ventilation and approaching ventilatory limits. No desat. Likely combined cardiac and pulmonary limitations.  . Family  history of adverse reaction to anesthesia    "mom had hard time to wake up"  . History of blood transfusion 1964   "I hemorrhaged after my baby was born"  . Hypertension   . Hypothyroidism   . Migraine    "under control w/RX" (03/30/2016)  . Pneumonia 02/2016   "didn't know I had it" (03/30/2016)  . 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 Cancer Sister     brain tumor  . Cancer Brother     unknown ca  . Kidney disease Mother   . COPD Father     smoker    SOCIAL HISTORY:   Social History   Social History  . Marital status: Married    Spouse name: N/A  . Number of children: 3  . Years of education: N/A   Occupational History  . Not on file.   Social History Main Topics  . Smoking status: Former Smoker    Packs/day: 1.00    Years: 3.00    Types: Cigarettes    Quit date: 08/23/1975  . Smokeless tobacco: Never Used  . Alcohol use No  . Drug use: No  . Sexual activity: Yes   Other Topics Concern  . Not on file   Social History Narrative   retired    ALLERGIES:    Allergies  Allergen Reactions  . Imitrex [  Sumatriptan] Other (See Comments)    syncope  . Latex     Blisters from prolonged exposure to latex  . Celebrex [Celecoxib] Other (See Comments)    palpations   . Codeine Itching  . Percocet [Oxycodone-Acetaminophen] Itching    CURRENT MEDICATIONS:    Current Outpatient Prescriptions  Medication Sig Dispense Refill  . amLODipine (NORVASC) 5 MG tablet Take 5 mg by mouth at bedtime.    Marland Kitchen aspirin EC 81 MG tablet Take 81 mg by mouth daily.    . Biotin 5 MG CAPS Take 5 mg by mouth daily.    . diazepam (VALIUM) 10 MG tablet Take 10 mg by mouth at bedtime.     Marland Kitchen doxylamine, Sleep, (SLEEP AID) 25 MG tablet Take 25 mg by mouth at bedtime.    . fexofenadine (ALLEGRA) 180 MG tablet Take 180 mg by mouth daily.    Marland Kitchen FLUoxetine (PROZAC) 10 MG capsule Take 1 capsule by  mouth daily.    . furosemide (LASIX) 20 MG tablet Take 1 tablet (20 mg total) by mouth daily. 30 tablet 1  . levothyroxine (SYNTHROID, LEVOTHROID) 112 MCG tablet Take 112 mcg by mouth daily before breakfast. Brand name only**    . naproxen (NAPROSYN) 500 MG tablet Take 500 mg by mouth 2 (two) times daily as needed for mild pain or moderate pain.   1  . sodium chloride (OCEAN) 0.65 % SOLN nasal spray Place 1 spray into both nostrils at bedtime as needed for congestion.    . traMADol (ULTRAM) 50 MG tablet Take 50 mg by mouth at bedtime as needed for pain.  0   No current facility-administered medications for this visit.     REVIEW OF SYSTEMS:   [X]  denotes positive finding, [ ]  denotes negative finding Cardiac  Comments:  Chest pain or chest pressure: x   Shortness of breath upon exertion: x   Short of breath when lying flat:    Irregular heart rhythm:        Vascular    Pain in calf, thigh, or hip brought on by ambulation: x   Pain in feet at night that wakes you up from your sleep:     Blood clot in your veins: x   Leg swelling:  x       Pulmonary    Oxygen at home:    Productive cough:  x   Wheezing:         Neurologic    Sudden weakness in arms or legs:     Sudden numbness in arms or legs:     Sudden onset of difficulty speaking or slurred speech:    Temporary loss of vision in one eye:     Problems with dizziness:         Gastrointestinal    Blood in stool:      Vomited blood:         Genitourinary    Burning when urinating:     Blood in urine:        Psychiatric    Major depression:         Hematologic    Bleeding problems:    Problems with blood clotting too easily:        Skin    Rashes or ulcers:        Constitutional    Fever or chills:     PHYSICAL EXAM:   Vitals:   12/26/16 1322 12/26/16 1326  BP: (!) 152/86 139/87  Pulse: 67   Resp: 18   Temp: 97.1 F (36.2 C)   TempSrc: Oral   SpO2: 96%   Weight: 125 lb 14.4 oz (57.1 kg)   Height: 5'  2" (1.575 m)     GENERAL: The patient is a well-nourished female, in no acute distress. The vital signs are documented above. CARDIAC: There is a regular rate and rhythm.  VASCULAR: Palpable posterior tibial pulse bilaterally.  Palpable dorsalis pedis on the right. PULMONARY: Nonlabored respirations ABDOMEN: Soft and non-tender with normal pitched bowel sounds.  MUSCULOSKELETAL: Anterior and lateral compartments are tender to palpation as is the medial calf on the left. NEUROLOGIC: No focal weakness or paresthesias are detected. SKIN: There are no ulcers or rashes noted. PSYCHIATRIC: The patient has a normal affect.  STUDIES:   Venous reflux study was ordered and evaluated today.  There is no evidence of deep or superficial venous reflux.  Also, there is no evidence of DVT  ASSESSMENT and PLAN   Left leg pain: I discussed the ultrasound findings today with the patient.  Based on these results, I do not feel she has any evidence of venous insufficiency.  In addition with a palpable pedal pulse, I do not think her leg pain is related to arterial or venous pathology.  I discussed that she potentially could have something like a reflex sympathetic dystrophy as a source of her pain.  She was comfortable by the fact that her arterial and venous evaluation was normal.  She is going to try to minimize her use of naproxen, and increase her exercise level.  She will contact me if she has any further concerns.   Annamarie Major, MD [facility] [phone] Pager [phone]

## 2016-12-28 LAB — AFB ORGANISM ID BY DNA PROBE
M AVIUM COMPLEX: POSITIVE — AB
M tuberculosis complex: NEGATIVE

## 2016-12-28 LAB — ACID FAST CULTURE WITH REFLEXED SENSITIVITIES (MYCOBACTERIA): Acid Fast Culture: POSITIVE — AB

## 2016-12-28 LAB — MAC SUSCEPTIBILITY BROTH
Ciprofloxacin: >16
Clarithromycin: 2
Ethambutol: 8
Moxifloxacin: 4
Rifampin: 8

## 2017-01-19 DIAGNOSIS — E0789 Other specified disorders of thyroid: Secondary | ICD-10-CM | POA: Diagnosis not present

## 2017-01-19 DIAGNOSIS — I10 Essential (primary) hypertension: Secondary | ICD-10-CM | POA: Diagnosis not present

## 2017-01-20 ENCOUNTER — Telehealth: Payer: Self-pay | Admitting: Internal Medicine

## 2017-01-20 NOTE — Telephone Encounter (Signed)
No she does not need referral  To ID. Many times we do not Rx this unless patient is symptomatic. She can keep regular fu with me and then discuss  Dr. Brand Males, M.D., [facility].C.P Pulmonary and Critical Care Medicine Staff Physician [place] [facility] Pager: [phone], If no answer or between  15:00h - 7:00h: call [phone]  01/20/2017 4:13 PM

## 2017-01-20 NOTE — Telephone Encounter (Signed)
Called and spoke to pt. Informed her of the results and recs per MR. Pt verbalized understanding and states her SOB is close to be resolved and is questioning if she even needs a referral to ID. Pt is requesting recs from MR if pt should still be referred or not.   MR please advise. Thanks.

## 2017-01-20 NOTE — Telephone Encounter (Signed)
Pt is aware of below message and voiced her understanding. Nothing further needed.  

## 2017-01-20 NOTE — Telephone Encounter (Signed)
Gina Lyons  I saw Gina Lyons 12/20/16 for fu from bronch early April 2018. A week after Isaw her and told her no evidence of infection - the BAL from April grew MAC which was original reason for bronch. This can explain her dyspnea. She needs ID referral. This is not urgent. Please call her ASAP and I am happy to make followup call next day or so as soon as I get a chance.  Pickig up result only now - sorry.   Thanks  Dr. Brand Males, M.D., [facility].C.P Pulmonary and Critical Care Medicine Staff Physician [place] [facility] Pager: [phone], If no answer or between  15:00h - 7:00h: call [phone]  01/20/2017 4:48 AM

## 2017-02-20 ENCOUNTER — Other Ambulatory Visit: Payer: Self-pay | Admitting: Internal Medicine

## 2017-02-20 DIAGNOSIS — I519 Heart disease, unspecified: Secondary | ICD-10-CM

## 2017-03-20 ENCOUNTER — Other Ambulatory Visit (INDEPENDENT_AMBULATORY_CARE_PROVIDER_SITE_OTHER): Payer: Medicare Other

## 2017-03-20 ENCOUNTER — Telehealth: Payer: Self-pay | Admitting: Internal Medicine

## 2017-03-20 ENCOUNTER — Ambulatory Visit (INDEPENDENT_AMBULATORY_CARE_PROVIDER_SITE_OTHER): Payer: Medicare Other | Admitting: Internal Medicine

## 2017-03-20 ENCOUNTER — Encounter: Payer: Self-pay | Admitting: Internal Medicine

## 2017-03-20 ENCOUNTER — Ambulatory Visit (INDEPENDENT_AMBULATORY_CARE_PROVIDER_SITE_OTHER)
Admission: RE | Admit: 2017-03-20 | Discharge: 2017-03-20 | Disposition: A | Discharge status: Home / Self Care | Payer: Medicare Other | Source: Ambulatory Visit | Attending: Internal Medicine | Admitting: Internal Medicine

## 2017-03-20 VITALS — BP 110/68 | HR 70 | Temp 97.7°F | Ht 62.0 in | Wt 128.0 lb

## 2017-03-20 DIAGNOSIS — R05 Cough: Secondary | ICD-10-CM | POA: Diagnosis not present

## 2017-03-20 DIAGNOSIS — R0602 Shortness of breath: Secondary | ICD-10-CM | POA: Diagnosis not present

## 2017-03-20 DIAGNOSIS — R058 Other specified cough: Secondary | ICD-10-CM

## 2017-03-20 DIAGNOSIS — R0609 Other forms of dyspnea: Secondary | ICD-10-CM

## 2017-03-20 LAB — BASIC METABOLIC PANEL
BUN: 16 mg/dL (ref 6–23)
CALCIUM: 9.3 mg/dL (ref 8.4–10.5)
CO2: 28 meq/L (ref 19–32)
CREATININE: 0.82 mg/dL (ref 0.40–1.20)
Chloride: 104 mEq/L (ref 96–112)
GFR: 72.53 mL/min (ref >60.00)
GLUCOSE: 78 mg/dL (ref 70–99)
Potassium: 3.7 mEq/L (ref 3.5–5.1)
Sodium: 141 mEq/L (ref 135–145)

## 2017-03-20 LAB — CBC WITH DIFFERENTIAL/PLATELET
Basophils Absolute: 0.1 10*3/uL (ref 0.0–0.1)
Basophils Relative: 1.2 % (ref 0.0–3.0)
EOS PCT: 4.7 % (ref 0.0–5.0)
Eosinophils Absolute: 0.3 10*3/uL (ref 0.0–0.7)
HCT: 39.2 % (ref 36.0–46.0)
Hemoglobin: 13 g/dL (ref 12.0–15.0)
LYMPHS ABS: 2.4 10*3/uL (ref 0.7–4.0)
Lymphocytes Relative: 32.1 % (ref 12.0–46.0)
MCHC: 33.1 g/dL (ref 30.0–36.0)
MCV: 95.2 fl (ref 78.0–100.0)
MONOS PCT: 7.5 % (ref 3.0–12.0)
Monocytes Absolute: 0.6 10*3/uL (ref 0.1–1.0)
NEUTROS ABS: 4 10*3/uL (ref 1.4–7.7)
NEUTROS PCT: 54.5 % (ref 43.0–77.0)
PLATELETS: 313 10*3/uL (ref 150.0–400.0)
RBC: 4.12 Mil/uL (ref 3.87–5.11)
RDW: 13.2 % (ref 11.5–15.5)
WBC: 7.3 10*3/uL (ref 4.0–10.5)

## 2017-03-20 LAB — TSH: TSH: 1.15 u[IU]/mL (ref 0.35–4.50)

## 2017-03-20 LAB — BRAIN NATRIURETIC PEPTIDE: Pro B Natriuretic peptide (BNP): 66 pg/mL (ref 0.0–100.0)

## 2017-03-20 LAB — SEDIMENTATION RATE: Sed Rate: 16 mm/hr (ref 0–30)

## 2017-03-20 MED ORDER — FAMOTIDINE 20 MG PO TABS: ORAL_TABLET | ORAL | 11 refills | Status: DC

## 2017-03-20 MED ORDER — TRAMADOL HCL 50 MG PO TABS: ORAL_TABLET | ORAL | 0 refills | Status: DC

## 2017-03-20 MED ORDER — PANTOPRAZOLE SODIUM 40 MG PO TBEC: 40.0000 mg | DELAYED_RELEASE_TABLET | Freq: Every day | ORAL | 2 refills | Status: DC

## 2017-03-20 MED ORDER — PREDNISONE 10 MG PO TABS: ORAL_TABLET | ORAL | 0 refills | Status: DC

## 2017-03-20 NOTE — Progress Notes (Signed)
Subjective:     Patient ID: Gina Lyons, female   DOB: 1946-07-15,     MRN: [id number]  HPI  42 yowf  Quit smoking in 1977 and no cough or sob until blood clot leg "and in my lung"  and variable sob not proportionate to activity and incessant daily cough since    Studies to date: Venous Doppler, 03/31/16 showed DVT in the short segment of left peroneal vein CTA chest , 7/18 2017 negative for pulmonary embolism, bibasilar sub-solid pulmonary nodules measuring up to 7 mm CTA chest 03/19/2016 negative for pulmonary embolism, stable dilation of the ascending aorta CTA chest 03/30/2016 negative for pulmonary emboli, bilateral ground glass opacities, nodular opacities within the lingula, right middle lobe and right lower lobe VQ scan 03/31/16 bilateral patchy matched ventilation/perfusion defects, intermediate  probability for pulmonary embolus. 2D echo 03/31/16>EF 50-55%, gr 1 DD , PAP 33mmHg Pulmonary function test on 03/31/2016 showed a FEV1 at 107%, ratio 77, FVC of 105%, no significant bronchodilator response, DLCO 63%      03/20/2017 acute extended ov/T[facility]dy Boyd re: sob at rest and dry cough x one year  Chief Complaint  Patient presents with  . Acute Visit    Increased SOB, no energy, wheezing and non prod cough x 6 wks. Her cough is non prod, and esp worse at night, sometimes wakes her up.  No appetite.    after last ov with MR 12/20/16 (no change rx) Breathing was better for two weeks only if sitting but doe  x 50 ft  And mailbox and stops 200 ft flat ever since dvt "PE"  Since then sob at rest unless sleeping p diazepam and "sleeping pill" and tramadol not aware of noct symptoms but after wakes each day  Always stops due to wob just walking to kitchen  Cough worse/ very harsh dry day > noct  PC rec prozac higher dose but no better so back to 10 mg daily  No better on saba hfa/ neb   No obvious patterns in day to day or daytime variability or assoc excess/ purulent sputum or mucus plugs or  hemoptysis or cp or chest tightness,  or overt sinus or hb symptoms. No unusual exp hx or h/o childhood pna/ asthma or knowledge of premature birth.  Sleeping ok without nocturnal  or early am exacerbation  of respiratory  c/o's or need for noct saba. Also denies any obvious fluctuation of symptoms with weather or environmental changes or other aggravating or alleviating factors except as outlined above   Current Medications, Allergies, Complete Past Medical History, Past Surgical History, Family History, and Social History were reviewed in [employer] record.  ROS  The following are not active complaints unless bolded sore throat, dysphagia, dental problems, itching, sneezing,  nasal congestion or excess/ purulent secretions, ear ache,   fever, chills, sweats, unintended wt loss, classically pleuritic or exertional cp,  orthopnea pnd or leg swelling, presyncope, palpitations, abdominal pain, anorexia, nausea, vomiting, diarrhea  or change in bowel or bladder habits, change in stools or urine, dysuria,hematuria,  rash, arthralgias, visual complaints, headache, numbness, weakness or ataxia or problems with walking or coordination,  change in mood/affect or memory.               Review of Systems     Objective:   Physical Exam Depressed amb wf nad with sign breaths at rest   Wt Readings from Last 3 Encounters:  03/20/17 128 lb (58.1 kg)  12/26/16 125  lb 14.4 oz (57.1 kg)  12/20/16 127 lb (57.6 kg)    Vital signs reviewed  - Note on arrival 02 sats  97% on RA     HEENT: nl dentition, turbinates bilaterally, and oropharynx. Nl external ear canals without cough reflex   NECK :  without JVD/Nodes/TM/ nl carotid upstrokes bilaterally   LUNGS: no acc muscle use,  Nl contour chest which is clear to A and P bilaterally without cough on insp or exp maneuvers   CV:  RRR  no s3 or murmur or increase in P2, and no edema   ABD:  soft and nontender with nl  inspiratory excursion in the supine position. No bruits or organomegaly appreciated, bowel sounds nl  MS:  Nl gait/ ext warm without deformities, calf tenderness, cyanosis or clubbing No obvious joint restrictions   SKIN: warm and dry without lesions    NEURO:  alert, approp, nl sensorium with  no motor or cerebellar deficits apparent.    CXR PA and Lateral:   03/20/2017 :    I personally reviewed images and agree with radiology impression as follows:   Lingular scarring.  No active disease.   Labs ordered/ reviewed:      Chemistry      Component Value Date/Time   NA 141 03/20/2017 1421   K 3.7 03/20/2017 1421   CL 104 03/20/2017 1421   CO2 28 03/20/2017 1421   BUN 16 03/20/2017 1421   CREATININE 0.82 03/20/2017 1421   CREATININE 0.82 06/21/2016 1023      Component Value Date/Time   CALCIUM 9.3 03/20/2017 1421   ALKPHOS 69 03/31/2016 0101   AST 21 03/31/2016 0101   ALT 15 03/31/2016 0101   BILITOT 0.4 03/31/2016 0101        Lab Results  Component Value Date   WBC 7.3 03/20/2017   HGB 13.0 03/20/2017   HCT 39.2 03/20/2017   MCV 95.2 03/20/2017   PLT 313.0 03/20/2017       Lab Results  Component Value Date   TSH 1.15 03/20/2017     Lab Results  Component Value Date   PROBNP 66.0 03/20/2017       Lab Results  Component Value Date   ESRSEDRATE 16 03/20/2017   ESRSEDRATE 17 03/31/2016               Assessment:

## 2017-03-20 NOTE — Assessment & Plan Note (Signed)
The most common causes of chronic cough in immunocompetent adults include the following: upper airway cough syndrome (UACS), previously referred to as postnasal drip syndrome (PNDS), which is caused by variety of rhinosinus conditions; (2) asthma; (3) GERD; (4) chronic bronchitis from cigarette smoking or other inhaled environmental irritants; (5) nonasthmatic eosinophilic bronchitis; and (6) bronchiectasis.   These conditions, singly or in combination, have accounted for up to 94% of the causes of chronic cough in prospective studies.   Other conditions have constituted no >6% of the causes in prospective studies These have included bronchogenic carcinoma, chronic interstitial pneumonia, sarcoidosis, left ventricular failure, ACEI-induced cough, and aspiration from a condition associated with pharyngeal dysfunction.    Chronic cough is often simultaneously caused by more than one condition. A single cause has been found from 38 to 82% of the time, multiple causes from 18 to 62%. Multiply caused cough has been the result of three diseases up to 42% of the time.       Although she carries dx of bronchiectasis, this cough is much more likely Upper airway cough syndrome (previously labeled PNDS) , is  so named because it's frequently impossible to sort out how much is  CR/sinusitis with freq throat clearing (which can be related to primary GERD)   vs  causing  secondary (" extra esophageal")  GERD from wide swings in gastric pressure that occur with throat clearing, often  promoting self use of mint and menthol lozenges that reduce the lower esophageal sphincter tone and exacerbate the problem further in a cyclical fashion.   These are the same pts (now being labeled as having "irritable larynx syndrome" by some cough centers) who not infrequently have a history of having failed to tolerate ace inhibitors,  dry powder inhalers or biphosphonates or report having atypical/extraesophageal reflux symptoms that  don't respond to standard doses of PPI  and are easily confused as having aecopd or asthma flares by even experienced allergists/ pulmonologists (myself included).  In addition: Of the three most common causes of  Sub-acute or recurrent or chronic cough, only one (GERD)  can actually contribute to/ trigger  the other two (asthma and post nasal drip syndrome)  and perpetuate the cylce of cough.  While not intuitively obvious, many patients with chronic low grade reflux do not cough until there is a primary insult that disturbs the protective epithelial barrier and exposes sensitive nerve endings.   This is typically viral but can be direct physical injury such as with an endotracheal tube.   The point is that once this occurs, it is difficult to eliminate the cycle  using anything but a maximally effective acid suppression regimen at least in the short run, accompanied by an appropriate diet to address non acid GERD and control / eliminate the cough itself for at least 3 days.   rec pred x 6 days/ tramadol to control acute cough and max rx for gerd   I had an extended discussion with the patient reviewing all relevant studies completed to date and  lasting 25 minutes of a 40  minute acute visit in pt new to me    re  severe non-specific but potentially very serious refractory respiratory symptoms of uncertain and potentially multiple  etiologies.   Each maintenance medication was reviewed in detail including most importantly the difference between maintenance and prns and under what circumstances the prns are to be triggered using an action plan format that is not reflected in the computer generated alphabetically organized  AVS.    Please see AVS for specific instructions unique to this office visit that I personally wrote and verbalized to the the pt in detail and then reviewed with pt  by my nurse highlighting any changes in therapy/plan of care  recommended at today's visit.

## 2017-03-20 NOTE — Assessment & Plan Note (Signed)
03/20/2017   Walked RA x one lap @ 185 stopped due to  Sob with sats 100%   Symptoms are markedly disproportionate to objective findings and not clear this is actually much of a  lung problem but pt does appear to have difficult to sort out respiratory symptoms of unknown origin for which  DDX  = almost all start with A and  include Adherence, Ace Inhibitors, Acid Reflux, Active Sinus Disease, Alpha 1 Antitripsin deficiency, Anxiety masquerading as Airways dz,  ABPA,  Allergy(esp in young), Aspiration (esp in elderly), Adverse effects of meds,  Active smokers, A bunch of PE's (a small clot burden can't cause this syndrome unless there is already severe underlying pulm or vascular dz with poor reserve) plus two Bs  = Bronchiectasis and Beta blocker use..and one C= CHF     Adherence is always the initial "prime suspect" and is a multilayered concern that requires a "trust but verify" approach in every patient - starting with knowing how to use medications, especially inhalers, correctly, keeping up with refills and understanding the fundamental difference between maintenance and prns vs those medications only taken for a very short course and then stopped and not refilled.  - need a trust but verify approach to med reconciliation and return with all meds in hand using a trust but verify approach to confirm accurate Medication  Reconciliation The principal here is that until we are certain that the  patients are doing what we've asked, it makes no sense to ask them to do more.   ? Acid (or non-acid) GERD > always difficult to exclude as up to 75% of pts in some series report no assoc GI/ Heartburn symptoms> rec max (24h)  acid suppression and diet restrictions/ reviewed and instructions given in writing.   ? Anxiety > usually at the bottom of this list of usual suspects but should be much higher on this pt's based on H and P and note already on psychotropics   ? Allergy/asthma  > doubt/ send profile/ pred x  6 days only / saba  Prn  ? A bunch of PE's > turns out has had multiple neg CTa's since onset of symptoms and since they have not changed and based on walking study today no indication to repeat.  ? BB effect > based on absence of asthma and low dose atenolol probably not relevant.  ? chf > excluded with bnp so low   see avs for instructions unique to this ov

## 2017-03-20 NOTE — Telephone Encounter (Signed)
Spoke with pt, (see 6/1 phone note) says that her cough is not improved and would like to proceed with next steps.  Pt requesting an OV asap to discuss this.  MR is not in office, scheduled to see MW today at 1:30.  Nothing further needed at this time.

## 2017-03-20 NOTE — Patient Instructions (Addendum)
The key to effective treatment for your cough is eliminating the non-stop cycle of cough you're stuck in long enough to let your airway heal completely and then see if there is anything still making you cough once you stop the cough suppression, but this should take no more than 5 days to figure out  First take delsym two tsp every 12 hours and supplement if needed with  tramadol 50 mg up to 1- 2 every 4 hours to suppress the urge to cough at all or even clear your throat. Swallowing water or using ice chips/non mint and menthol containing candies (such as lifesavers or sugarless jolly ranchers) are also effective.  You should rest your voice and avoid activities that you know make you cough.  Once you have eliminated the cough for 3 straight days try reducing the tramadol first,  then the delsym as tolerated.    Prednisone 10 mg take  4 each am x 2 days,   2 each am x 2 days,  1 each am x 2 days and stop (this is to eliminate allergies and inflammation from coughing)  Protonix (pantoprazole) Take 30-60 min before first meal of the day and Pepcid 20 mg one bedtime plus chlorpheniramine 4 mg x 2 at bedtime (both available over the counter)  until cough is completely gone for at least a week without the need for cough suppression  GERD (REFLUX)  is an extremely common cause of respiratory symptoms, many times with no significant heartburn at all.    It can be treated with medication, but also with lifestyle changes including avoidance of late meals, excessive alcohol, smoking cessation, and avoid fatty foods, chocolate, peppermint, colas, red wine, and acidic juices such as orange juice.  NO MINT OR MENTHOL PRODUCTS SO NO COUGH DROPS  USE HARD CANDY INSTEAD (jolley ranchers or Stover's or Lifesavers (all available in sugarless versions) NO OIL BASED VITAMINS - use powdered substitutes.   If not better in 2 weeks call for follow up

## 2017-03-21 LAB — RESPIRATORY ALLERGY PROFILE REGION II ~~LOC~~
Allergen, A. alternata, m6: <0.1 kU/L
Allergen, C. Herbarum, M2: <0.1 kU/L
Allergen, D pternoyssinus,d7: <0.1 kU/L
Allergen, Mulberry, t76: <0.1 kU/L
Box Elder IgE: <0.1 kU/L
Cockroach: <0.1 kU/L
D. farinae: <0.1 kU/L
Dog Dander: <0.1 kU/L
IGE (IMMUNOGLOBULIN E), SERUM: 33 kU/L (ref <115)
Johnson Grass: <0.1 kU/L
Rough Pigweed  IgE: <0.1 kU/L
Sheep Sorrel IgE: <0.1 kU/L

## 2017-03-21 NOTE — Progress Notes (Signed)
Spoke with pt and notified of results per Dr. Wert. Pt verbalized understanding and denied any questions. 

## 2017-03-27 ENCOUNTER — Telehealth: Payer: Self-pay | Admitting: Internal Medicine

## 2017-03-27 NOTE — Telephone Encounter (Signed)
Spoke with patient. She stated that she has been having some SOB issues for the past several days. She wanted to see only MR. Patient has been scheduled for 03/28/17 at 50 with MR. She verbalized understanding. Nothing further needed at time of call.

## 2017-03-28 ENCOUNTER — Ambulatory Visit (INDEPENDENT_AMBULATORY_CARE_PROVIDER_SITE_OTHER)
Admission: RE | Admit: 2017-03-28 | Discharge: 2017-03-28 | Disposition: A | Discharge status: Home / Self Care | Payer: Medicare Other | Source: Ambulatory Visit | Attending: Internal Medicine | Admitting: Internal Medicine

## 2017-03-28 ENCOUNTER — Telehealth: Payer: Self-pay | Admitting: Internal Medicine

## 2017-03-28 ENCOUNTER — Encounter: Payer: Self-pay | Admitting: Internal Medicine

## 2017-03-28 ENCOUNTER — Ambulatory Visit (INDEPENDENT_AMBULATORY_CARE_PROVIDER_SITE_OTHER): Payer: Medicare Other | Admitting: Internal Medicine

## 2017-03-28 ENCOUNTER — Other Ambulatory Visit: Payer: Medicare Other

## 2017-03-28 VITALS — BP 138/66 | HR 58 | Ht 62.0 in | Wt 126.0 lb

## 2017-03-28 DIAGNOSIS — R918 Other nonspecific abnormal finding of lung field: Secondary | ICD-10-CM

## 2017-03-28 DIAGNOSIS — R0602 Shortness of breath: Secondary | ICD-10-CM | POA: Diagnosis not present

## 2017-03-28 DIAGNOSIS — R0609 Other forms of dyspnea: Principal | ICD-10-CM

## 2017-03-28 DIAGNOSIS — R5383 Other fatigue: Secondary | ICD-10-CM

## 2017-03-28 DIAGNOSIS — A310 Pulmonary mycobacterial infection: Secondary | ICD-10-CM | POA: Insufficient documentation

## 2017-03-28 LAB — D-DIMER, QUANTITATIVE (NOT AT ARMC): D DIMER QUANT: 0.83 ug{FEU}/mL — AB (ref <0.50)

## 2017-03-28 MED ORDER — IOPAMIDOL (ISOVUE-370) INJECTION 76%
100.0000 mL | Freq: Once | INTRAVENOUS | Status: AC | PRN
  Administered 2017-03-28: 80 mL via INTRAVENOUS

## 2017-03-28 NOTE — Addendum Note (Signed)
Addended by: Collier Salina on: 03/28/2017 09:51 AM   Modules accepted: Orders

## 2017-03-28 NOTE — Patient Instructions (Addendum)
ICD-10-CM   1. Dyspnea on exertion R06.09   2. Pulmonary infiltrate R91.8   3. 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

## 2017-03-28 NOTE — Telephone Encounter (Signed)
D-dimer HIGH  Plan - CTA chest with contrast for dyspnea, prior hx of dvt, and positive d-dimer - please get it TODAY any location - if positive of PE, results should be to [phone].   Dr. Brand Males, M.D., [facility].C.P Pulmonary and Critical Care Medicine Staff Physician [facility] Pager: [phone], If no answer or between  15:00h - 7:00h: call [phone]  03/28/2017 1:29 PM

## 2017-03-28 NOTE — Telephone Encounter (Signed)
Pt has been scheduled for CTA today at [facility]. Order has been placed. Pt has been made aware. Nothing further needed.

## 2017-03-28 NOTE — Telephone Encounter (Signed)
Let Gina Lyons know that -> No PE . 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 Refer ID  Fu wit an app in few weeks  Dr. Brand Males, M.D., [facility].C.P Pulmonary and Critical Care Medicine Staff Physician [facility] Pager: [phone], If no answer or between  15:00h - 7:00h: call [phone]  03/28/2017 5:19 PM       Ct Angio Chest W/cm &/or Wo Cm  Result Date: 03/28/2017 CLINICAL DATA:  Shortness of Breath EXAM: CT ANGIOGRAPHY CHEST WITH CONTRAST TECHNIQUE: Multidetector CT imaging of the chest was performed using the standard protocol during bolus administration of intravenous contrast. Multiplanar CT image reconstructions and MIPs were obtained to evaluate the vascular anatomy. CONTRAST:  80 mL Isovue 370 nonionic COMPARISON:  Chest CT October 25, 2016; chest radiograph March 20, 2017 FINDINGS: Cardiovascular: There is no demonstrable pulmonary embolus. Ascending thoracic aorta has a maximum transverse diameter of 4.0 x 3.9 cm. There is no thoracic aortic dissection. Visualized great vessels appear unremarkable with the exception of slight calcification at the origin of the left subclavian artery. There are foci of atherosclerotic calcification in the aorta. Pericardium is not appreciably thickened. Mediastinum/Nodes: Left lobe of thyroid absent. Right lobe thyroid diminutive. No thyroid lesion evident. No evident adenopathy. No esophageal lesion is evident on this study. Lungs/Pleura: There is again noted bronchiectatic change bilaterally, stable from prior study. There has been no progression of bronchiectasis since prior study. Patchy areas of tree on bud type appearance are again noted in both lower lobes as well as in the lingula, not progressed. There is a nodular opacity in the superior segment of the right lower lobe measuring 7 x 7 mm seen on axial slice 43 series 6, unchanged. There has been partial clearing of opacity in the area  of the nodular appearing lesion abutting the pleura in the superior segment of the right lower lobe medially. This area which measured 1.3 x 1.1 cm on the prior study now measures 0.6 x 0.6 cm. Several small nodular opacities seen in the left lower lobe on the prior study are either smaller or no longer evident. Scarring in the inferior lingula is noted with slightly less opacity in this area compared to most recent study. No new parenchymal lung nodular opacities are appreciable. No pleural effusion or pleural thickening evident. No well-defined consolidation. Upper Abdomen: Visualized upper abdominal structures appear unremarkable except for mild atherosclerotic calcification in the abdominal aorta. Musculoskeletal: There is a hemangioma in the T9 vertebral body. There is a smaller hemangioma in the T8 vertebral body. There are no blastic or lytic bone lesions. There is postoperative change in the visualized lower cervical spine. Review of the MIP images confirms the above findings. IMPRESSION: 1.  No demonstrable pulmonary embolus. 2. Ascending thoracic aorta has a maximum transverse diameter of 4.0 x 3.9 cm. Recommend annual imaging followup by CTA or MRA. This recommendation follows 2010 ACCF/AHA/AATS/ACR/ASA/SCA/SCAI/SIR/STS/SVM Guidelines for the Diagnosis and Management of Patients with Thoracic Aortic Disease. Circulation. 2010; 121: W295-A213. No dissection. Foci of atherosclerotic calcification in the aorta noted. 3. The bronchiectatic change noted previously is again noted. There are areas of scarring as well as areas of tree on bud type appearance, consistent with a degree of underlying chronic pneumonitis. 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 No new opacity is evident. No airspace consolidation. A nodular appearing area in the  medial aspect of the superior segment right lower lobe has become smaller, likely due to clearing  of small focus of infiltrate. There remains a 7 x 7 mm nodular lesion in the right lower lobe superior segment. No enlarging pulmonary lesions evident. 4.  No demonstrable adenopathy. 5.  Diminutive right lobe of thyroid.  Left lobe thyroid absent. 6.  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 Aortic Atherosclerosis (ICD10-I70.0). Electronically Signed   By: Lowella Grip III M.D.   On: 03/28/2017 17:01

## 2017-03-28 NOTE — Progress Notes (Signed)
Subjective:     Patient ID: Gina Lyons, female   DOB: [birth year], 71 y.o.   MRN: [id number]  HPI    71 year old female former smoker, only 3 pack year history, and for pulmonary consult July 2017 for DVT (No PE on CT chest x 3 ) treated with Xarelto.   TEST Gina Lyons.  Venous Doppler, 03/04/2016 showed DVT in the short segment of left peroneal vein CTA chest , 7/18 2017 negative for pulmonary embolism, bibasilar sub-solid pulmonary nodules measuring up to 7 mm CTA chest 03/19/2016 negative for pulmonary embolism, stable dilation of the ascending aorta CTA chest 03/30/2016 negative for pulmonary emboli, bilateral ground glass opacities, nodular opacities within the lingula, right middle lobe and right lower lobe VQ scan 03/31/16 bilateral patchy matched ventilation/perfusion defects, intermediate  probability for pulmonary embolus. 2D echo 03/31/16>EF 50-55%, gr 1 DD , PAP 54mHg Pulmonary function test on 03/31/2016 showed a FEV1 at 107%, ratio 77, FVC of 105%, no significant bronchodilator response, DLCO 63%  04/12/2016 post hospital follow-up Patient presents for a post hospital follow-up. It was diagnosed with a DVT on the left 03/04/2016. She was started on Xarelto. She was on Estradiol and she stopped this. CT chest on July 18 was negative for PE. Patient had 3 weeks of progressive shortness of breath She was admitted on 03/30/2016. Patient was admitted August 9 through 04/01/2016 for progressive shortness of breath. CT chest was done that was negative for pulmonary embolism. It did show bilateral groundglass opacities  and nodular opacities within the lingula, right middle lobe and right lower lobe. Was transitioned from XRiotoo Eliquis and hospitalization. Patient did undergo gentle diuresis. She was also treated with Levaquin for questionable atypical infection. A VQ scan was done that showed bilateral patchy matched ventilation and perfusion defects with intermittent probability for  pulmonary embolism. She says she was told she had a PE.from this .   A 2-D echo showed a normal EF at 50-55% with grade 1 diastolic dysfunctions and pulmonary artery pressures at 25 m of mercury. Pulmonary function test showed normal lung function with an FEV1 at 107%. Patient did have a diffusing defect =DLCO 63%.  Autoimmune and CTD workup was neg (HIV, ANCA, GBM, CCP , dsDNA ab , ANA )  Discharge. Patient is feeling some better but still gets winded with walking. No dyspnea at rest .  She denies cough, hemoptysis , abd pain, n/v./d. She does have some soreness along left LE.  Says prior to admit she was very active with yard work , cycling and house work. No dyspnea at all.  Prior to admit no long travel, injury , surgery . No hx of DVT/PE . No FH of DVT/PE.  No hx of cancer.     OV 05/13/2016  Chief Complaint  Patient presents with  . Follow-up    Pt here after CXR and seeing TP on 8.22.17. Pt states her SOB has not improved since also OV. PT c/o DOE, intermittent dry cough, chest tightness when SOB and resolves when she lays down. Pt states she had mild left foot swelling on 9.21.17, this has resolved. Pt denies f/c/s.     [name]89year old female with very limited remote smoking. In July 2017 she was admitted with left-sided DVT and was started on Xarelto. Sometime after that according to her history she started developing shortness of breath which has persisted to this day despite changes with Xarelto to es.E;liquis. This dyspnea is pervasive. It is moderate to  severe in intensity. It is present for exertion and relieved by rest. Class II-III levels of activity makes her dyspneic. There is no orthopnea paroxysmal nocturnal dyspnea. She had 3 CT scans of the chest in July 2017 that ruled out pulmonary embolism but in August 2017 she did have some new groundglass opacities. August 2017 VQ scan was of intermediate probability. Only function does show reduction in diffusion capacity  suggesting the groundglass opacities might be real. She did have autoimmune workup in August 2017 along with complement and vascular this workup that was normal. She presents today with her husband. She is really frustrated by her dyspnea. Chest x-ray personally visualized shows some nonspecific scarring. She is not able to do her household activities.  she has a dry cough of mild intensity for the last 6 months. Cough preceded onset of dyspnea and DVT. At last visit they specifically denied this cough. This no associated wheezing or orthopnea paroxysmal nocturnal dyspnea or chills.    Exhaled nitric oxide testing today in the office 05/13/2016: 23 ppb and normal   Walking desaturation test on 05/13/2016 185 feet x 3 laps:   did NOT desaturate. Rest pulse ox was 100%, final pulse ox was 100% was the final pulse ox w heart rate response was at rest78. HR response  to 94/min at peak exertion.    Noted SCL 70 not done (no dysphagia)  D dimer this visti 0.36 and n  ormal  Dg Chest 2 View  Result Date: 05/13/2016 CLINICAL DATA:  Follow-up pulmonary infiltrates. EXAM: CHEST  2 VIEW COMPARISON:  04/07/2016 and 03/31/2016 FINDINGS: Mild hyperinflation is stable. Stable linear densities in the anterior left lung. No significant airspace disease or consolidation. Heart and mediastinum are within normal limits and stable. Surgical plate in the lower cervical spine. Trachea is midline. No large pleural effusions. Old left seventh rib fracture. IMPRESSION: Stable linear densities in the left lung are probably related to scarring. No acute cardiopulmonary disease. Electronically Signed   By: Markus Daft M.D.   On: 05/13/2016 14:25       OV 06/01/2016  Chief Complaint  Patient presents with  . Follow-up    Pt here after CPST. Pt states her SOB has slightly improved since last OV. Pt c/o dry cough and occassional chest tightness - under breasts after eating dinner in evening. Pt denies chest congestion  and f/c/s.     Dyspnea evaluation. Here for review of pulmonary stress test. She underwent pulmonary stress test 05/26/2016. She gave an excellent effort. She had normal VO2 max and anaerobic threshold. Nevertheless there is a flattening of her O2 pulse curve that is significant. This is associated with tachypnea and dead space ventilation and approaching ventilatory limits. Pulse ox did not go down. In summary this is a combinati    She is reporting weight gain since a deep vein thrombosis. So far pulmonary embolism has never been confirmed. In fact the CT chest in October 2017 only shows groundglass opacities. Echocardiogram shows diastolic dysfunction. In addition after coming off her estrogen which she was taking for many years [in July 2017 after DVT] she is reporting significant hot flashes which are frustrating her. She continues to suffer from dyspnea and is frustrated by this as well.     OV 08/10/2016  Chief Complaint  Patient presents with  . Follow-up    Pt states overall her SOB has improved but has bad days every now and then. Pt c/o more SOB today and occassional  dry cough with some  frank blood - size of penny. Pt also states she has chest tightness when she is SOB - this resolves with rest.     Follow-up dyspnea: We finally determined that this dyspnea was unlikely due to pulmonary embolism. Was related to diastolic dysfunction and pulmonary groundglass infiltrates sustained in July 2017 at the time of deep vein thrombosis diagnosis. Last visit October 2017 I started Lasix therapy. Since then with the passage of time and Lasix therapy she has improved with her dyspnea. She still does have mild dyspnea on exertion. She has not attended pulmonary rehabilitation. She does not want to go to pulmonary habitation due to logistical issues and time issues. She and her husband said they will start exercising on a road bike for an exercise bike by themselves gradually.  In terms of deep  vein thrombosis related to estrogen intake.. She continues to be on anticoagulation since July 2017. She's completed 5 months of therapy. Interim d-dimer  normal. She has no bleeding complications. She wants to stop anticoagulation.  OV 10/25/2016  Chief Complaint  Patient presents with  . Follow-up    Pt here after HRCT. pt states her breathing is unchanged since last OV. Pt denies cough and CP/tightness.     Follow-up dyspnea: At last visit she told me that with Lasix therapy that her dyspnea improved. Today she tells me that she has now plateaued in terms of improvement. She still has dyspnea on exertion relieved by rest. The Lasix is helping but without the Lasix it is worse. It at the same time she feels she has not returned to her baseline pre-summer 2017. High-resolution CT scan of the chest done today and personally visualized shows the following abnormalities described suggestive of MAI infection  In terms of the left lower extremity DVT: In December 2017 we stopped anticoag based on normal D-dimer. She says since summer 2017 continues with daily left pain that sarts in ankle and spreads up to popliteal area along the venous course. On February 2018 on the 21st she had Doppler ultrasound of the left lower extremity that did not show any DVT. She also had chemistries that are normal. She takes Naprosyn once daily for this. She says all this is new since the DVT. She is frustrated by the pain. She says that primary care has deferred this issue to me also notices associated edema. She is wondering about vascular insufficiency and is requesting whether she should be referred to a vascular evaluation  Ct Chest High Resolution  Result Date: 10/25/2016 CLINICAL DATA:  Dyspnea and wheezing. Former smoker. History of pulmonary embolism and DVT. Follow-up abnormal chest CT. EXAM: CT CHEST WITHOUT CONTRAST TECHNIQUE: Multidetector CT imaging of the chest was performed following the standard protocol  without intravenous contrast. High resolution imaging of the lungs, as well as inspiratory and expiratory imaging, was performed. COMPARISON:  03/30/2016 chest CT angiogram. 07/08/2016 chest radiograph. FINDINGS: Cardiovascular: Normal heart size. No significant pericardial fluid/thickening. Left anterior descending coronary atherosclerosis. Atherosclerotic thoracic aorta with stable mild ectasia of the ascending thoracic aorta (maximum diameter 3.9 cm). Normal caliber pulmonary arteries. Mediastinum/Nodes: Status post left hemithyroidectomy. No discrete right thyroid lobe nodules. Unremarkable esophagus. No pathologically enlarged axillary, mediastinal or gross hilar lymph nodes, noting limited sensitivity for the detection of hilar adenopathy on this noncontrast study. Lungs/Pleura: No pneumothorax. No pleural effusion. There is mild-to-moderate cylindrical and varicoid bronchiectasis extensively distributed throughout both lungs, most prominent in the basilar right upper lobe, right  middle lobe and lingula. There is extensive patchy tree-in-bud and centrilobular nodularity throughout both lungs at the areas of bronchiectasis, which appears overall mildly worsened since 03/30/2016, including a new dominant nodular 13 x 11 mm opacity in the medial dependent right lower lobe (series 3/ image 76). Bandlike subpleural opacities at the areas of bronchiectasis in the basilar right upper lobe, medial segment right middle lobe and lingula are stable and compatible with postinfectious scars. Otherwise no acute consolidative airspace disease or lung masses. Mild centrilobular emphysema. Mild patchy air trapping in both lungs on the expiration sequence. No frank honeycombing. Upper abdomen: Partially visualized left parapelvic renal cysts. Musculoskeletal: No aggressive appearing focal osseous lesions. Partially visualized surgical hardware from ACDF in the lower cervical spine. Stable T9 vertebral hemangioma. Mild thoracic  spondylosis. IMPRESSION: 1. 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 Findings have mildly worsened in the interval since 03/30/2016, including a new dominant 13 mm nodular opacity in the right lower lobe. Recommend initial follow-up post treatment high-resolution chest CT in 6 months. 2. Mild patchy air trapping in both lungs, suggesting small airways disease. 3. Aortic atherosclerosis.  One vessel coronary atherosclerosis. Electronically Signed   By: Ilona Sorrel M.D.   On: 10/25/2016 10:07    OV 12/20/2016  Chief Complaint  Patient presents with  . Follow-up    Pt here after bronch. Pt states her breathing is unchanged since last OV. Pt c/o dry cough and chest heaviness. Pt denies f/c/s and CP/tightness.    Follow-up pulmonary infiltrates not otherwise specified: She underwent bronchoscopy with bronchoalveolar lavage of July 2018 which showed 57% monocytes and 26% neutrophils. Cultures are negative except for colonizing contaminant mold ; low colony count. Last CT scan was March 2018  Follow-up dyspnea: She continues to have dyspnea. She continues to insist that is all after the DVT. There are no new issues. She is planning [place] exercise [place]    03/20/2017 acute extended ov/Wert re: sob at rest and dry cough x one year  Chief Complaint  Patient presents with  . Acute Visit    Increased SOB, no energy, wheezing and non prod cough x 6 wks. Her cough is non prod, and esp worse at night, sometimes wakes her up.  No appetite.    after last ov with MR 12/20/16 (no change rx) Breathing was better for two weeks only if sitting but doe  x 50 ft  And mailbox and stops  200 ft flat ever since dvt "PE"  Since then sob at rest unless sleeping p diazepam and "sleeping pill" and tramadol not aware of noct symptoms but after wakes each day  Always stops due to wob just walking to kitchen  Cough worse/ very harsh dry day > noct  PC rec prozac higher dose but no better so back to 10 mg daily  No better on saba hfa/ neb   No obvious patterns in day to day or daytime variability or assoc excess/ purulent sputum or mucus plugs or hemoptysis or cp or chest tightness,  or overt sinus or hb symptoms. No unusual exp hx or h/o childhood pna/ asthma or knowledge of premature birth.    OV 03/28/2017  Chief Complaint  Patient presents with  . Follow-up  Pt saw MW on 7.30.2018 for an acute visit. Pt states her SOB has not improved since last OV. Pt states she still has a dry cough. Pt denies CP/tightness, f/c/s, chest congestion.    Aidaly Cordner is had long-standing shortness of breath that remained somewhat unexplained. The onset was in summer 2017 at the time of admission for DVT. There is no clear-cut evidence of pulmonary embolism. She did have some groundglass opacities at that time. Initially diuresis helped but subsequently did not. Over time her CT scan of the chest to change to the extent that in spring 2018 dose addition she had MAI infection. I did a bronchoscopy in April 2018 and then saw her for follow-up in May 2018. At this point in time the cultures were negative but subsequently in June 2018 the cultures returned showing MAI infection. I had a phone conversation with her and we opted to follow because she was feeling better. Now she presents for follow-up since her visit in May 2018 she has had worsening shortness of breath. In the interim she even saw my colleague Dr. Legrand Como wert for worsening shortness of breath. Walking desaturation test 185 feet 3 laps on room air with a full head probe: d walk only one lap and got very short of breath. She was short of breath  for the remaining 510 minutes of a conversation in the room. She did not desaturate below 100% and she did not get tachycardic. She is also reporting severe associated significant fatigue. Dyspnea and symptoms are worse in the last few months. During household work is extremely tiring. She says the fatigue has been worked up extensively by her primary care physician without any obvious cause  Last d-dimer September 2017 was normal. normal. She's currently not on anticoagulation  Echocardiogram August 2017 with ejection fraction 55% with normal nuclear medicine cardiac stress test November 2017  Normal allergy tests July 2018  Chest x-ray 03/20/2017: Personally visualized. Chronic bronchitic changes. Radiology second running high resolution CT chest.  Results for KENSLEIGH, GATES (MRN [id number]) as of 03/28/2017 09:26  Ref. Range 11/28/2016 15:35  ACID FAST SMEAR (AFB) Unknown Rpt  ACID FAST CULTURE WITH REFLEXED SENSITIVITIES Unknown Rpt (A)  Acid Fast Culture Unknown Positive (A)  Organism ID Unknown Comment (A)  MAC SUSCEPTIBILITY BROTH Unknown Rpt (A)  AFB ORGANISM ID BY DNA PROBE Unknown Rpt (A)   IMPRESSION: 1. 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 Findings have mildly worsened in the interval since 03/30/2016, including a new dominant 13 mm nodular opacity in the right lower lobe. Recommend initial follow-up post treatment high-resolution chest CT in 6 months. 2. Mild patchy air trapping in both lungs, suggesting small airways disease. 3. Aortic atherosclerosis.  One vessel coronary atherosclerosis.   Electronically Signed   By: Ilona Sorrel M.D.   On: 10/25/2016  10:07    has a past medical history of Anxiety; Arthritis; Chronic lower back pain; Diastolic dysfunction; DVT (deep venous thrombosis) ([place]); DVT (deep venous thrombosis) ([place]); Dyspnea on exertion; Family history of adverse reaction to anesthesia; History of blood transfusion (1964); Hypertension; Hypothyroidism; Migraine; Pneumonia (02/2016); and Tumor, thyroid.   reports that she quit smoking about 41 years ago. Her smoking use included Cigarettes. She has a 3.00 pack-year smoking history. She has never used smokeless  tobacco.  Past Surgical History:  Procedure Laterality Date  . BACK SURGERY    . BILATERAL SALPINGOOPHORECTOMY Bilateral   . CARPAL TUNNEL RELEASE Right   . DILATION AND CURETTAGE OF UTERUS    . HEMORRHOID SURGERY    . [place] SURGERY  2013?  Marland Kitchen POSTERIOR CERVICAL FUSION/FORAMINOTOMY    . REFRACTIVE SURGERY Bilateral   . TOTAL THYROIDECTOMY  ?1974  . VAGINAL HYSTERECTOMY    . VIDEO BRONCHOSCOPY Bilateral 11/28/2016   Procedure: VIDEO BRONCHOSCOPY WITHOUT FLUORO;  Surgeon: Brand Males, MD;  Location: [facility];  Service: Cardiopulmonary;  Laterality: Bilateral;    Allergies  Allergen Reactions  . Imitrex [Sumatriptan] Other (See Comments)    syncope  . Latex     Blisters from prolonged exposure to latex  . Celebrex [Celecoxib] Other (See Comments)    palpations   . Codeine Itching  . Percocet [Oxycodone-Acetaminophen] Itching    Immunization History  Administered Date(s) Administered  . Influenza, High Dose Seasonal PF 04/12/2016  . Pneumococcal Polysaccharide-23 03/31/2016    Family History  Problem Relation Age of Onset  . Cancer Sister        brain tumor  . Cancer Brother        unknown ca  . Kidney disease Mother   . COPD Father        smoker     Current Outpatient Prescriptions:  .  amLODipine (NORVASC) 5 MG tablet, Take 5 mg by mouth at bedtime., Disp: , Rfl:  .  aspirin EC 81 MG tablet, Take 81 mg by mouth daily., Disp: , Rfl:   .  Biotin 5 MG CAPS, Take 5 mg by mouth daily., Disp: , Rfl:  .  diazepam (VALIUM) 10 MG tablet, Take 10 mg by mouth at bedtime. , Disp: , Rfl:  .  doxylamine, Sleep, (SLEEP AID) 25 MG tablet, Take 25 mg by mouth at bedtime., Disp: , Rfl:  .  famotidine (PEPCID) 20 MG tablet, One at bedtime, Disp: 30 tablet, Rfl: 11 .  fexofenadine (ALLEGRA) 180 MG tablet, Take 180 mg by mouth daily., Disp: , Rfl:  .  FLUoxetine (PROZAC) 10 MG capsule, Take 1 capsule by mouth daily., Disp: , Rfl:  .  furosemide (LASIX) 20 MG tablet, TAKE 1 TABLET BY MOUTH ONCE DAILY, Disp: 30 tablet, Rfl: 1 .  levothyroxine (SYNTHROID, LEVOTHROID) 112 MCG tablet, Take 112 mcg by mouth daily before breakfast. Brand name only**, Disp: , Rfl:  .  naproxen (NAPROSYN) 500 MG tablet, Take 500 mg by mouth 2 (two) times daily as needed for mild pain or moderate pain. , Disp: , Rfl: 1 .  pantoprazole (PROTONIX) 40 MG tablet, Take 1 tablet (40 mg total) by mouth daily. Take 30-60 min before first meal of the day, Disp: 30 tablet, Rfl: 2 .  sodium chloride (OCEAN) 0.65 % SOLN nasal spray, Place 1 spray into both nostrils at bedtime as needed for congestion., Disp: , Rfl:  .  traMADol (ULTRAM) 50 MG tablet, 1-2 every 4 hours as needed for cough or pain, Disp: 40 tablet, Rfl: 0   Review of Systems     Objective:   Physical Exam  Constitutional: She is oriented to person, place, and time. No distress.  HENT:  Head: Normocephalic and atraumatic.  Right Ear: External ear normal.  Left Ear: External ear normal.  Mouth/Throat: Oropharynx is clear and moist. No oropharyngeal exudate.  Eyes: Pupils are equal, round, and reactive to light. Conjunctivae and EOM are normal. Right  eye exhibits no discharge. Left eye exhibits no discharge. No scleral icterus.  Neck: Normal range of motion. Neck supple. No JVD present. No tracheal deviation present. No thyromegaly present.  Cardiovascular: Normal rate, regular rhythm, normal heart sounds and  intact distal pulses.  Exam reveals no gallop and no friction rub.   No murmur heard. Pulmonary/Chest: Effort normal and breath sounds normal. No respiratory distress. She has no wheezes. She has no rales. She exhibits no tenderness.  Size and pants and talks  Abdominal: Soft. Bowel sounds are normal. She exhibits no distension and no mass. There is no tenderness. There is no rebound and no guarding.  Musculoskeletal: Normal range of motion. She exhibits no edema or tenderness.  Lymphadenopathy:    She has no cervical adenopathy.  Neurological: She is alert and oriented to person, place, and time. She has normal reflexes. No cranial nerve deficit. She exhibits normal muscle tone. Coordination normal.  Skin: Skin is warm and dry. No rash noted. She is not diaphoretic. No erythema. No pallor.  Psychiatric: Her behavior is normal. Judgment normal.  Flat affect  Vitals reviewed.  Vitals:   03/28/17 0922  BP: 138/66  Pulse: (!) 58  SpO2: 99%  Weight: 126 lb (57.2 kg)  Height: _0  (1.575 m)   Estimated body mass index is 23.05 kg/m as calculated from the following:   Height as of this encounter: _1  (1.575 m).   Weight as of this encounter: 126 lb (57.2 kg).     Assessment:       ICD-10-CM   1. Dyspnea on exertion R06.09   2. Pulmonary infiltrate R91.8   3. 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 Brand Males, M.D., [facility].C.P Pulmonary and  Critical Care Medicine Staff Physician [facility] Pager: [phone], If no answer or between  15:00h - 7:00h: call [phone]  03/28/2017 9:42 AM

## 2017-03-31 ENCOUNTER — Telehealth: Payer: Self-pay | Admitting: Internal Medicine

## 2017-03-31 DIAGNOSIS — A310 Pulmonary mycobacterial infection: Secondary | ICD-10-CM

## 2017-03-31 NOTE — Telephone Encounter (Signed)
Spoke with pt and notified of results per Dr. Chase Caller. Pt verbalized understanding and denied any questions. Referral to ID was placed. I scheduled her with MR for first available (sooner appt with APP offered and refused). Pt will call us sooner if needed.

## 2017-03-31 NOTE — Telephone Encounter (Signed)
See phone note dated 03/31/17- pt called back and the below results were transferred to this encounter and pt was called

## 2017-03-31 NOTE — Telephone Encounter (Signed)
Pt returning call

## 2017-03-31 NOTE — Telephone Encounter (Signed)
Note    Let Gina Lyons know that -> No PE . 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 Refer ID  Fu wit an app in few weeks  Dr. Brand Males, M.D., [facility].C.P Pulmonary and Critical Care Medicine Staff Physician [place] [facility] Pager: [phone], If no answer or between  15:00h - 7:00h: call [phone]  03/28/2017 5:19 PM      LMTCB x 1

## 2017-04-03 ENCOUNTER — Telehealth: Payer: Self-pay | Admitting: Internal Medicine

## 2017-04-03 DIAGNOSIS — A310 Pulmonary mycobacterial infection: Secondary | ICD-10-CM

## 2017-04-03 NOTE — Telephone Encounter (Signed)
Called spoke with patient who verifies that she was referred to ID on 8.10.18 per that date's phone note:  03/31/17 3:33 PM   Note    Let Glenford Bayley that -> No PE . 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 Refer ID  Fu wit an app in few weeks  Dr. Brand Males, M.D., [facility].C.P Pulmonary and Critical Care Medicine Staff Physician [place] [facility] Pager: [phone], If no answer or between 15:00h - 7:00h: call [phone]  03/28/2017 5:19 PM      LMTCB x 1        Per pt she is good with the referral to ID but would prefer ID w/ [facility] rather than [place].  Advised pt will adjust referral to reflect this.  Will route to MR as FYI and sign off

## 2017-04-05 ENCOUNTER — Telehealth: Payer: Self-pay | Admitting: Internal Medicine

## 2017-04-05 NOTE — Telephone Encounter (Signed)
Spoke with patient. She was wanting to be seen by [facility] ASAP. Advised her that we had placed the order to [facility] on the 13th and provided her with a number to their office. She stated that she would call them. Nothing else was needed at time of call.

## 2017-04-10 NOTE — Telephone Encounter (Signed)
When is her ID appt at [facility]?

## 2017-04-11 NOTE — Telephone Encounter (Signed)
No appt scheduled in pt's chart through care everywhere.   lmtcb X1 for pt to confirm that she does not yet have an appt.

## 2017-04-12 ENCOUNTER — Encounter: Payer: Self-pay | Admitting: Internal Medicine

## 2017-04-12 ENCOUNTER — Ambulatory Visit (INDEPENDENT_AMBULATORY_CARE_PROVIDER_SITE_OTHER): Payer: Medicare Other | Admitting: Internal Medicine

## 2017-04-12 VITALS — BP 123/79 | HR 58 | Temp 97.5°F | Ht 62.0 in | Wt 126.0 lb

## 2017-04-12 DIAGNOSIS — A310 Pulmonary mycobacterial infection: Secondary | ICD-10-CM

## 2017-04-12 MED ORDER — RIFAMPIN 300 MG PO CAPS: 600.0000 mg | ORAL_CAPSULE | Freq: Every day | ORAL | 5 refills | Status: DC

## 2017-04-12 MED ORDER — AZITHROMYCIN 500 MG PO TABS: 500.0000 mg | ORAL_TABLET | Freq: Every day | ORAL | 5 refills | Status: DC

## 2017-04-12 MED ORDER — ETHAMBUTOL HCL 400 MG PO TABS: 15.0000 mg/kg | ORAL_TABLET | Freq: Every day | ORAL | 5 refills | Status: DC

## 2017-04-12 NOTE — Progress Notes (Signed)
[facility] for Infectious Disease      Reason for Consult: bronchiectasis with MAI    Referring Physician: Dr. Chase Caller    Patient ID: Gina Lyons, female    DOB: 13-Jan-1946, 71 y.o.   MRN: [id number]  HPI:   Comes in with complaints of SOB, severe DOE, and work up c/w bronchectasis and BAL with MAI done in May 2018.  She has signficant coughing at night, unable to walk to the bathroom without significant sob.  She initially was feeling better after the BAL in May but has been progressively worsening.  No fever, no night sweats, no weight loss.  No vision issues.  No rashes.  Has never been on treatment for this.  Is no longer taking blood thinner.   CT independently reviewed and has a nodule as well.  Previous record reviewed from Epic and BAL culture results and sensitivities.    Past Medical History:  Diagnosis Date  . Anxiety    "just w/this breathing problem" (03/30/2016)  . Arthritis    "back" (03/30/2016)  . Chronic lower back pain   . Diastolic dysfunction    a. 03/2016 Echo: EF 50-55%, no rwma, GR1 DD, mild AI, triv MR/TR, PASP 70mHg.  .[place] (deep [place]    a. 02/2016 LLE DVT-->Xarelto, switched to Eliquis 03/2016 b/c pt felt that xarelto may have been causing shortness of breath.  . [place]   . Dyspnea on exertion    a. 03/08/2016, 03/19/2016, 03/30/2016 CTA Chest: No PE;  b. 03/2016 V:Q Scan: Intermediate probability for PE;  c. 05/2016 Pulmonary Stress Test: Excellent effort, nl VO2 max and anaerobic threshold, flattening of O2 pulse curve associated with tachypnea and dead space ventilation and approaching ventilatory limits. No desat. Likely combined cardiac and pulmonary limitations.  . Family history of adverse reaction to anesthesia    "mom had hard time to wake up"  . History of blood transfusion 1964   "I hemorrhaged after my baby was born"  . Hypertension   . Hypothyroidism   . Migraine    "under control w/RX"  (03/30/2016)  . Pneumonia 02/2016   "didn't know I had it" (03/30/2016)  . 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 Authorizing Provider  amLODipine (NORVASC) 5 MG tablet Take 5 mg by mouth at bedtime.   Yes [provider]  aspirin EC 81 MG tablet Take 81 mg by mouth daily.   Yes [provider]  Biotin 5 MG CAPS Take 5 mg by mouth daily.   Yes [provider]  diazepam (VALIUM) 10 MG tablet Take 10 mg by mouth at bedtime.    Yes [provider]  doxylamine, Sleep, (SLEEP AID) 25 MG tablet Take 25 mg by mouth at bedtime.   Yes [provider]  famotidine (PEPCID) 20 MG tablet One at bedtime 03/20/17  Yes WTanda Rockers MD  fexofenadine (ALLEGRA) 180 MG tablet Take 180 mg by mouth daily.   Yes [provider]  FLUoxetine (PROZAC) 10 MG capsule Take 1 capsule by mouth daily. 12/19/16  Yes [provider]  furosemide (LASIX) 20 MG tablet TAKE 1 TABLET BY MOUTH ONCE DAILY 02/20/17  Yes RBrand Males MD  levothyroxine (SYNTHROID, LEVOTHROID) 112 MCG tablet Take 112 mcg by mouth daily before breakfast. Brand name only**   Yes [provider]  naproxen (NAPROSYN) 500 MG tablet Take 500 mg by mouth 2 (two) times daily as needed for mild pain or moderate pain.  01/19/16  Yes [provider]  pantoprazole (PROTONIX) 40 MG tablet Take 1 tablet (40 mg total) by mouth daily. Take 30-60 min before first meal of the day 03/20/17  Yes Tanda Rockers, MD  sodium chloride (OCEAN) 0.65 % SOLN nasal spray Place 1 spray into both nostrils at bedtime as needed for congestion.   Yes [provider]  traMADol (ULTRAM) 50 MG tablet 1-2 every 4 hours as needed for cough or pain 03/20/17  Yes Tanda Rockers, MD  azithromycin (ZITHROMAX) 500 MG tablet Take 1 tablet (500 mg total) by mouth daily. 04/12/17   Thayer Headings, MD  ethambutol  (MYAMBUTOL) 400 MG tablet Take 2 tablets (800 mg total) by mouth daily. 04/12/17   Thayer Headings, MD  rifampin (RIFADIN) 300 MG capsule Take 2 capsules (600 mg total) by mouth daily. 04/12/17   Jael Waldorf, Okey Regal, MD    Allergies  Allergen Reactions  . Imitrex [Sumatriptan] Other (See Comments)    syncope  . Latex     Blisters from prolonged exposure to latex  . Celebrex [Celecoxib] Other (See Comments)    palpations   . Codeine Itching  . Percocet [Oxycodone-Acetaminophen] Itching    Social History  Substance Use Topics  . Smoking status: Former Smoker    Packs/day: 1.00    Years: 3.00    Types: Cigarettes    Quit date: 08/23/1975  . Smokeless tobacco: Never Used  . Alcohol use No    Family History  Problem Relation Age of Onset  . Cancer Sister        brain tumor  . Cancer Brother        unknown ca  . Kidney disease Mother   . COPD Father        smoker    Review of Systems  Constitutional: negative for sweats, fatigue and malaise Gastrointestinal: negative for diarrhea Integument/breast: negative for rash All other systems reviewed and are negative    Constitutional: in no apparent distress  Vitals:   04/12/17 1443  BP: 123/79  Pulse: (!) 58  Temp: (!) 97.5 F (36.4 C)   EYES: anicteric ENMT: Cardiovascular: Cor RRR Respiratory: CTA B; normal respiratory effort at rest GI: Bowel sounds are normal, liver is not enlarged, spleen is not enlarged Musculoskeletal: no pedal edema noted Skin: negatives: no rash Hematologic: no cervical lad   Labs: Lab Results  Component Value Date   WBC 7.3 03/20/2017   HGB 13.0 03/20/2017   HCT 39.2 03/20/2017   MCV 95.2 03/20/2017   PLT 313.0 03/20/2017    Lab Results  Component Value Date   CREATININE 0.82 03/20/2017   BUN 16 03/20/2017   NA 141 03/20/2017   K 3.7 03/20/2017   CL 104 03/20/2017   CO2 28 03/20/2017    Lab Results  Component Value Date   ALT 15 03/31/2016   AST 21 03/31/2016   ALKPHOS 69  03/31/2016   BILITOT 0.4 03/31/2016     Assessment: 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  I discussed the natural history of MAI infection, causes and association with bronchiectasis.  I discussed treatment is minimum of 1 year if she is having some benefit from it.  I discussed the different medications and side effects, monitoring and daily vs three times per week. With her symptoms, I would classify  this as severe and will have her try to take daily for now.  If she responds in the next month, can narrow to three times a week then.  If no improvement, will consider IV or inhaled amikacin.    Plan: 1) rifampin 600 mg daily 2) ethambutol 800 mg daily 3) azithromycin 500 mg daily 4) she will go back to her opthalmologist Dr. Gershon Crane for an eye exam and alert him to her medications for a baseline on ethambutol 5) rtc 1 week to see if she is tolerating it and check CMP

## 2017-04-13 NOTE — Telephone Encounter (Signed)
LMTCB

## 2017-04-14 ENCOUNTER — Other Ambulatory Visit: Payer: Self-pay | Admitting: Surgery

## 2017-04-14 DIAGNOSIS — I712 Thoracic aortic aneurysm, without rupture, unspecified: Secondary | ICD-10-CM

## 2017-04-18 NOTE — Telephone Encounter (Signed)
lmtcb for pt x 3.

## 2017-04-19 ENCOUNTER — Telehealth: Payer: Self-pay | Admitting: Internal Medicine

## 2017-04-19 ENCOUNTER — Encounter: Payer: Self-pay | Admitting: Internal Medicine

## 2017-04-19 ENCOUNTER — Ambulatory Visit (INDEPENDENT_AMBULATORY_CARE_PROVIDER_SITE_OTHER): Payer: Medicare Other | Admitting: Internal Medicine

## 2017-04-19 VITALS — BP 131/81 | HR 74 | Temp 97.6°F | Wt 127.0 lb

## 2017-04-19 DIAGNOSIS — A310 Pulmonary mycobacterial infection: Secondary | ICD-10-CM | POA: Diagnosis not present

## 2017-04-19 DIAGNOSIS — R5383 Other fatigue: Secondary | ICD-10-CM | POA: Diagnosis not present

## 2017-04-19 LAB — CBC WITH DIFFERENTIAL/PLATELET
BASOS ABS: 48 {cells}/uL (ref 0–200)
Basophils Relative: 1 %
EOS PCT: 5 %
Eosinophils Absolute: 240 cells/uL (ref 15–500)
HCT: 38.7 % (ref 35.0–45.0)
HEMOGLOBIN: 12.6 g/dL (ref 11.7–15.5)
LYMPHS ABS: 1680 {cells}/uL (ref 850–3900)
Lymphocytes Relative: 35 %
MCH: 31 pg (ref 27.0–33.0)
MCHC: 32.6 g/dL (ref 32.0–36.0)
MCV: 95.3 fL (ref 80.0–100.0)
MPV: 8.9 fL (ref 7.5–12.5)
Monocytes Absolute: 384 cells/uL (ref 200–950)
Monocytes Relative: 8 %
Neutro Abs: 2448 cells/uL (ref 1500–7800)
Neutrophils Relative %: 51 %
Platelets: 343 10*3/uL (ref 140–400)
RBC: 4.06 MIL/uL (ref 3.80–5.10)
RDW: 13.8 % (ref 11.0–15.0)
WBC: 4.8 10*3/uL (ref 3.8–10.8)

## 2017-04-19 NOTE — Telephone Encounter (Signed)
See 04/03/17 telephone note.

## 2017-04-19 NOTE — Progress Notes (Signed)
   Subjective:    Patient ID: Gina Lyons, female    DOB: Apr 21, 1946, 71 y.o.   MRN: [id number]  HPI Here for follow upof MAI and bronchiectasis. Started on rifampin/azith/etham and tolerating.  Feeling more tired now but no n/v. No diarrhea or abdominal pain.  Taking daily.     Review of Systems  Constitutional: Positive for appetite change and fatigue. Negative for chills and fever.  Gastrointestinal: Negative for abdominal distention, abdominal pain, diarrhea and nausea.  Skin: Negative for rash.  Neurological: Negative for dizziness.       Objective:   Physical Exam  Constitutional: She appears well-developed and well-nourished. No distress.  Eyes: No scleral icterus.  Cardiovascular: Normal rate, regular rhythm and normal heart sounds.   No murmur heard. Pulmonary/Chest: Effort normal and breath sounds normal. No respiratory distress.  Lymphadenopathy:    She has no cervical adenopathy.  Skin: No rash noted.          Assessment & Plan:

## 2017-04-19 NOTE — Telephone Encounter (Signed)
lmtcb for pt.  Please see phone note from 8.13.2018.

## 2017-04-19 NOTE — Assessment & Plan Note (Signed)
Tolerating treatment.  Will check lfts today.  She is going to schedule with her ophthalmologist for a baseline exam on ethambutol.

## 2017-04-19 NOTE — Telephone Encounter (Signed)
Lm for patient x 1

## 2017-04-19 NOTE — Telephone Encounter (Signed)
She can see how her visit with Dr Linus Salmons goes and she can ask him and then make a joint decision with him . But I suspect she should be just fine with Dr Linus Salmons  Dr. Brand Males, M.D., [facility].C.P Pulmonary and Critical Care Medicine Staff Physician [place] [facility] Pager: [phone], If no answer or between  15:00h - 7:00h: call [phone]  04/19/2017 1:52 PM

## 2017-04-19 NOTE — Assessment & Plan Note (Signed)
Worsening now likely from the medications.

## 2017-04-19 NOTE — Telephone Encounter (Signed)
Pt returning call from before.  Was in another doctor's appt.

## 2017-04-19 NOTE — Telephone Encounter (Addendum)
Pt had an appt with [facility] at 05/20/17 and patient referred to Dr Linus Salmons in [place] for sooner appt - being seen today at 2pm. Pt wants to know if she needs to keep the [facility] Appt or if MR wants her to remain with Dr Linus Salmons.   Will hold in triage as well since message is closed.

## 2017-04-20 LAB — COMPLETE METABOLIC PANEL WITH GFR
ALT: 21 U/L (ref 6–29)
AST: 22 U/L (ref 10–35)
Albumin: 4.2 g/dL (ref 3.6–5.1)
Alkaline Phosphatase: 113 U/L (ref 33–130)
BUN: 16 mg/dL (ref 7–25)
CALCIUM: 9.1 mg/dL (ref 8.6–10.4)
CHLORIDE: 106 mmol/L (ref 98–110)
CO2: 21 mmol/L (ref 20–32)
CREATININE: 0.75 mg/dL (ref 0.60–0.93)
GFR, Est African American: >89 mL/min (ref >=60)
GFR, Est Non African American: 79 mL/min (ref >=60)
GLUCOSE: 103 mg/dL — AB (ref 65–99)
Potassium: 4.2 mmol/L (ref 3.5–5.3)
Sodium: 142 mmol/L (ref 135–146)
Total Bilirubin: 0.7 mg/dL (ref 0.2–1.2)
Total Protein: 6.3 g/dL (ref 6.1–8.1)

## 2017-04-20 NOTE — Telephone Encounter (Signed)
Spoke with patient, aware of rec's per MR  Brand Males, MD   Note 1:51 PM She can see how her visit with Dr Linus Salmons goes and she can ask him and then make a joint decision with him . But I suspect she should be just fine with Dr Linus Salmons     Pt is wanting to know if she needs to keep her appt with MR next month and in October 05/11/2017    11:15 A -- f/u [facility]IC/bronchiectasis//lmr    06/19/2017   9:00 A -- 6 month f/u per recall..ert   Please advise Dr Chase Caller. Thanks.   PLEASE LEAVE A DETAILED MESSAGE WHEN CALLING BACK.

## 2017-04-20 NOTE — Telephone Encounter (Signed)
Pt aware of rec's. States that she saw Dr Linus Salmons yesterday and really liked him and feels that she trusts him a great deal. No reason to switch back to [facility] at this time. Nothing further needed.

## 2017-04-25 ENCOUNTER — Telehealth: Payer: Self-pay | Admitting: *Deleted

## 2017-04-25 ENCOUNTER — Emergency Department (HOSPITAL_COMMUNITY): Payer: Medicare Other

## 2017-04-25 ENCOUNTER — Ambulatory Visit: Payer: Medicare Other | Admitting: Internal Medicine

## 2017-04-25 ENCOUNTER — Emergency Department (HOSPITAL_COMMUNITY)
Admission: EM | Admit: 2017-04-25 | Discharge: 2017-04-25 | Disposition: A | Discharge status: Home / Self Care | Payer: Medicare Other | Attending: Emergency Medicine | Admitting: Emergency Medicine

## 2017-04-25 ENCOUNTER — Encounter (HOSPITAL_COMMUNITY): Payer: Self-pay

## 2017-04-25 DIAGNOSIS — I5030 Unspecified diastolic (congestive) heart failure: Secondary | ICD-10-CM | POA: Insufficient documentation

## 2017-04-25 DIAGNOSIS — I110 Hypertensive heart disease with heart failure: Secondary | ICD-10-CM | POA: Insufficient documentation

## 2017-04-25 DIAGNOSIS — R0602 Shortness of breath: Secondary | ICD-10-CM | POA: Diagnosis not present

## 2017-04-25 DIAGNOSIS — A310 Pulmonary mycobacterial infection: Secondary | ICD-10-CM | POA: Diagnosis not present

## 2017-04-25 DIAGNOSIS — Z79899 Other long term (current) drug therapy: Secondary | ICD-10-CM | POA: Diagnosis not present

## 2017-04-25 DIAGNOSIS — Z91040 Latex allergy status: Secondary | ICD-10-CM | POA: Insufficient documentation

## 2017-04-25 DIAGNOSIS — E039 Hypothyroidism, unspecified: Secondary | ICD-10-CM | POA: Insufficient documentation

## 2017-04-25 DIAGNOSIS — Z7982 Long term (current) use of aspirin: Secondary | ICD-10-CM | POA: Insufficient documentation

## 2017-04-25 DIAGNOSIS — Z87891 Personal history of nicotine dependence: Secondary | ICD-10-CM | POA: Diagnosis not present

## 2017-04-25 LAB — BASIC METABOLIC PANEL
Anion gap: 11 (ref 5–15)
BUN: 19 mg/dL (ref 6–20)
CALCIUM: 9.3 mg/dL (ref 8.9–10.3)
CHLORIDE: 107 mmol/L (ref 101–111)
CO2: 23 mmol/L (ref 22–32)
CREATININE: 0.75 mg/dL (ref 0.44–1.00)
GFR calc non Af Amer: >60 mL/min (ref >60)
Glucose, Bld: 96 mg/dL (ref 65–99)
Potassium: 3.3 mmol/L — ABNORMAL LOW (ref 3.5–5.1)
SODIUM: 141 mmol/L (ref 135–145)

## 2017-04-25 LAB — CBC WITH DIFFERENTIAL/PLATELET
BASOS PCT: 1 %
Basophils Absolute: 0 10*3/uL (ref 0.0–0.1)
EOS ABS: 0.1 10*3/uL (ref 0.0–0.7)
Eosinophils Relative: 2 %
HCT: 37.7 % (ref 36.0–46.0)
HEMOGLOBIN: 12.9 g/dL (ref 12.0–15.0)
Lymphocytes Relative: 30 %
Lymphs Abs: 1.8 10*3/uL (ref 0.7–4.0)
MCH: 31 pg (ref 26.0–34.0)
MCHC: 34.2 g/dL (ref 30.0–36.0)
MCV: 90.6 fL (ref 78.0–100.0)
MONOS PCT: 6 %
Monocytes Absolute: 0.4 10*3/uL (ref 0.1–1.0)
NEUTROS PCT: 61 %
Neutro Abs: 3.6 10*3/uL (ref 1.7–7.7)
Platelets: 319 10*3/uL (ref 150–400)
RBC: 4.16 MIL/uL (ref 3.87–5.11)
RDW: 12.7 % (ref 11.5–15.5)
WBC: 5.9 10*3/uL (ref 4.0–10.5)

## 2017-04-25 LAB — I-STAT TROPONIN, ED: TROPONIN I, POC: 0 ng/mL (ref 0.00–0.08)

## 2017-04-25 LAB — BLOOD GAS, VENOUS
Acid-Base Excess: 1 mmol/L (ref 0.0–2.0)
Bicarbonate: 23.7 mmol/L (ref 20.0–28.0)
O2 Saturation: 50.4 %
PH VEN: 7.47 — AB (ref 7.250–7.430)
Patient temperature: 98.6
pCO2, Ven: 33.1 mmHg — ABNORMAL LOW (ref 44.0–60.0)

## 2017-04-25 LAB — I-STAT CG4 LACTIC ACID, ED: LACTIC ACID, VENOUS: 1.17 mmol/L (ref 0.5–1.9)

## 2017-04-25 MED ORDER — IOPAMIDOL (ISOVUE-370) INJECTION 76%
INTRAVENOUS | Status: AC
  Filled 2017-04-25: qty 100

## 2017-04-25 MED ORDER — ALBUTEROL SULFATE (2.5 MG/3ML) 0.083% IN NEBU
5.0000 mg | INHALATION_SOLUTION | Freq: Once | RESPIRATORY_TRACT | Status: AC
  Administered 2017-04-25: 5 mg via RESPIRATORY_TRACT
  Filled 2017-04-25: qty 6

## 2017-04-25 MED ORDER — IOPAMIDOL (ISOVUE-370) INJECTION 76%
100.0000 mL | Freq: Once | INTRAVENOUS | Status: AC | PRN
Start: 2017-04-25 — End: 2017-04-25
  Administered 2017-04-25: 100 mL via INTRAVENOUS

## 2017-04-25 NOTE — ED Provider Notes (Signed)
71 year old female received in sign out from Dr. 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 He is concerned about a possible PE and so she obtained a CT scan of the chest. This was negative for PE. 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 This is a known issue for her when I discussed it with her. Currently she is not having a significant issue breathing. She like to go home at this point and follow-up with her care team.  6:39 PM:  I have discussed the diagnosis/risks/treatment options with the patient and family and believe the pt to be eligible for discharge home to follow-up with PCP. We also discussed returning to the ED immediately if new or worsening sx occur. We discussed the sx which are most concerning (e.g., sudden worsening sob, fever, chest pain, syncope) that necessitate immediate return. Medications administered to the patient during their visit and any new prescriptions provided to the patient are listed below.  Medications given during this visit Medications  iopamidol (ISOVUE-370) 76 % injection (not administered)  albuterol (PROVENTIL) (2.5 MG/3ML) 0.083% nebulizer solution 5 mg (5 mg Nebulization Given 04/25/17 1230)  iopamidol (ISOVUE-370) 76 % injection 100 mL (100 mLs Intravenous Contrast Given 04/25/17 1708)     The patient appears reasonably screen and/or stabilized for discharge and I doubt any other medical condition or other [facility] requiring further screening, evaluation, or treatment in the ED at this time prior to discharge.     Deno Etienne, DO 04/25/17 1840

## 2017-04-25 NOTE — Telephone Encounter (Signed)
Patient called, stating she was having worsening symptoms - stating it was "hard to breathe", she felt "tingly all over like I am about to pass out", and increased nausea.  She is asking for her [facility] appointment to be moved up (referred by Pulmonology).  Patient wanted to wait until her work-in appointment at 3:30 (which she scheduled earlier this morning) for Dr Linus Salmons to see her, but due to her difficulty breathing in combination with her other symptoms, she will go to [facility] for evaluation.  Landis Gandy, RN

## 2017-04-25 NOTE — ED Notes (Signed)
Bed: WA20 Expected date:  Expected time:  Means of arrival:  Comments: Hold for triage

## 2017-04-25 NOTE — ED Triage Notes (Signed)
Patient c/o SOB and states this has been going on for a long time. Patient's husband states the patient has gotten progressively worse. Patient reports a fungus in her lungs. Patient also has a history of pulmonary embolus.

## 2017-04-25 NOTE — ED Notes (Addendum)
Pt ambulate in hall c/o SOB with dizziness RR=44. PT back in bed RR=24 02=94

## 2017-04-25 NOTE — ED Notes (Signed)
Bed: WLPT1 Expected date:  Expected time:  Means of arrival:  Comments: 

## 2017-04-25 NOTE — ED Provider Notes (Signed)
[facility] DEPT Provider Note   CSN: [id number] Arrival date & time: 04/25/17  1153     History   Chief Complaint Chief Complaint  Patient presents with  . Shortness of Breath    HPI Gina Lyons is a 71 y.o. female.  HPI  71 year old female with past medical history as below your with acute on chronic, worsening shortness of breath. The patient has had recent extensive workup by pulmonology as well as infectious disease. She was just diagnosed with Mycobacterium infection and started on triple antibiotic therapy. She states that since starting these medications, she has had progressively worsening coughing towards breath. She denies any sputum. She has history of bronchiectasis as well but has not had any fevers. She states that every time he just moved, she becomes short of breath, as well as lightheaded. She called the office today who advised her to present for evaluation. Denies any previous option requirements. She has a history of HIV or immune suppression. She has been taking antibiotics as prescribed. Symptoms do seem to improve rest, though she does admittedly have severe shortness of breath at rest as well.  Past Medical History:  Diagnosis Date  . Anxiety    "just w/this breathing problem" (03/30/2016)  . Arthritis    "back" (03/30/2016)  . Chronic lower back pain   . Diastolic dysfunction    a. 03/2016 Echo: EF 50-55%, no rwma, GR1 DD, mild AI, triv MR/TR, PASP 56mmHg.  Marland Kitchen DVT (deep venous thrombosis) ([place])    a. 02/2016 LLE DVT-->Xarelto, switched to Eliquis 03/2016 b/c pt felt that xarelto may have been causing shortness of breath.  . DVT (deep venous thrombosis) ([place])   . Dyspnea on exertion    a. 03/08/2016, 03/19/2016, 03/30/2016 CTA Chest: No PE;  b. 03/2016 V:Q Scan: Intermediate probability for PE;  c. 05/2016 Pulmonary Stress Test: Excellent effort, nl VO2 max and anaerobic threshold, flattening of O2 pulse curve associated with tachypnea and dead space ventilation  and approaching ventilatory limits. No desat. Likely combined cardiac and pulmonary limitations.  . Family history of adverse reaction to anesthesia    "mom had hard time to wake up"  . History of blood transfusion 1964   "I hemorrhaged after my baby was born"  . Hypertension   . Hypothyroidism   . Migraine    "under control w/RX" (03/30/2016)  . Pneumonia 02/2016   "didn't know I had it" (03/30/2016)  . Tumor, thyroid    "had feeler; not cancerous" (03/30/2016)    Patient Active Problem List   Diagnosis Date Noted  . 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 Fatigue 03/28/2017  . Upper airway cough syndrome 03/20/2017  . Bronchiectasis without complication ([place])   . Chest pressure 06/14/2016  . Abnormal PFT 06/01/2016  . Diastolic dysfunction, left ventricle 06/01/2016  . Abnormal CT scan, lung 06/01/2016  . Hot flashes 06/01/2016  . History of DVT of lower extremity 05/13/2016  . Dyspnea on exertion 03/30/2016  . Deep vein thrombosis (DVT) of distal vein of lower extremity ([place]) 03/28/2016  . Pulmonary infiltrate 03/28/2016  . Ascending aortic aneurysm ([place]) 03/28/2016    Past Surgical History:  Procedure Laterality Date  . BACK SURGERY    . BILATERAL SALPINGOOPHORECTOMY Bilateral   . CARPAL TUNNEL RELEASE Right   . DILATION AND CURETTAGE OF UTERUS    . HEMORRHOID SURGERY    . [place] SURGERY  2013?  Marland Kitchen POSTERIOR CERVICAL FUSION/FORAMINOTOMY    . REFRACTIVE SURGERY Bilateral   .  TOTAL THYROIDECTOMY  ?1974  . VAGINAL HYSTERECTOMY    . VIDEO BRONCHOSCOPY Bilateral 11/28/2016   Procedure: VIDEO BRONCHOSCOPY WITHOUT FLUORO;  Surgeon: Brand Males, MD;  Location: [facility] ENDOSCOPY;  Service: Cardiopulmonary;  Laterality: Bilateral;    OB History    No data available       Home Medications    Prior to Admission medications   Medication Sig Start Date End Date Taking? Authorizing Provider  amLODipine (NORVASC) 5 MG tablet Take 5 mg by mouth  at bedtime.   Yes [provider]  aspirin EC 81 MG tablet Take 81 mg by mouth daily.   Yes [provider]  azithromycin (ZITHROMAX) 500 MG tablet Take 1 tablet (500 mg total) by mouth daily. 04/12/17  Yes Comer, Okey Regal, MD  Biotin 5 MG CAPS Take 5 mg by mouth daily.   Yes [provider]  diazepam (VALIUM) 10 MG tablet Take 10 mg by mouth at bedtime.    Yes [provider]  doxylamine, Sleep, (SLEEP AID) 25 MG tablet Take 25 mg by mouth at bedtime.   Yes [provider]  ethambutol (MYAMBUTOL) 400 MG tablet Take 2 tablets (800 mg total) by mouth daily. 04/12/17  Yes Comer, Okey Regal, MD  fexofenadine (ALLEGRA) 180 MG tablet Take 180 mg by mouth daily.   Yes [provider]  FLUoxetine (PROZAC) 10 MG capsule Take 1 capsule by mouth daily. 12/19/16  Yes [provider]  furosemide (LASIX) 20 MG tablet TAKE 1 TABLET BY MOUTH ONCE DAILY 02/20/17  Yes Brand Males, MD  levothyroxine (SYNTHROID, LEVOTHROID) 112 MCG tablet Take 112 mcg by mouth daily before breakfast. Brand name only**   Yes [provider]  rifampin (RIFADIN) 300 MG capsule Take 2 capsules (600 mg total) by mouth daily. 04/12/17  Yes Comer, Okey Regal, MD  sodium chloride (OCEAN) 0.65 % SOLN nasal spray Place 1 spray into both nostrils at bedtime as needed for congestion.   Yes [provider]  famotidine (PEPCID) 20 MG tablet One at bedtime Patient not taking: Reported on 04/25/2017 03/20/17   Tanda Rockers, MD  pantoprazole (PROTONIX) 40 MG tablet Take 1 tablet (40 mg total) by mouth daily. Take 30-60 min before first meal of the day Patient not taking: Reported on 04/25/2017 03/20/17   Tanda Rockers, MD    Family History Family History  Problem Relation Age of Onset  . Cancer Sister        brain tumor  . Cancer Brother        unknown ca  . Kidney disease Mother   . COPD Father        smoker    Social History Social History  Substance Use  Topics  . Smoking status: Former Smoker    Packs/day: 1.00    Years: 3.00    Types: Cigarettes    Quit date: 08/23/1975  . Smokeless tobacco: Never Used  . Alcohol use No     Allergies   Imitrex [sumatriptan]; Latex; Celebrex [celecoxib]; Codeine; and Percocet [oxycodone-acetaminophen]   Review of Systems Review of Systems  Constitutional: Positive for fatigue. Negative for chills and fever.  HENT: Negative for congestion and rhinorrhea.   Eyes: Negative for visual disturbance.  Respiratory: Positive for shortness of breath. Negative for cough and wheezing.   Cardiovascular: Negative for chest pain and leg swelling.  Gastrointestinal: Negative for abdominal pain, diarrhea, nausea and vomiting.  Genitourinary: Negative for dysuria and flank pain.  Musculoskeletal: Negative  for neck pain and neck stiffness.  Skin: Negative for rash and wound.  Allergic/Immunologic: Negative for immunocompromised state.  Neurological: Positive for light-headedness. Negative for syncope, weakness and headaches.  All other systems reviewed and are negative.    Physical Exam Updated Vital Signs BP (!) 159/105 (BP Location: Right Arm)   Pulse 65   Temp 97.6 F (36.4 C) (Oral)   Resp 17   Ht 5\' 2"  (1.575 m)   Wt 57.6 kg (127 lb)   SpO2 96%   BMI 23.23 kg/m   Physical Exam  Constitutional: She is oriented to person, place, and time. She appears well-developed and well-nourished. No distress.  HENT:  Head: Normocephalic and atraumatic.  Eyes: Conjunctivae are normal.  Neck: Neck supple.  Cardiovascular: Normal rate, regular rhythm and normal heart sounds.  Exam reveals no friction rub.   No murmur heard. Pulmonary/Chest: Effort normal. No respiratory distress. She has no wheezes. She has rhonchi. She has no rales.  Abdominal: She exhibits no distension.  Musculoskeletal: She exhibits no edema.  Neurological: She is alert and oriented to person, place, and time. She exhibits normal muscle  tone.  Skin: Skin is warm. Capillary refill takes less than 2 seconds.  Psychiatric: She has a normal mood and affect.  Nursing note and vitals reviewed.    ED Treatments / Results  Labs (all labs ordered are listed, but only abnormal results are displayed) Labs Reviewed  BASIC METABOLIC PANEL - Abnormal; Notable for the following:       Result Value   Potassium 3.3 (*)    All other components within normal limits  BLOOD GAS, VENOUS - Abnormal; Notable for the following:    pH, Ven 7.470 (*)    pCO2, Ven 33.1 (*)    All other components within normal limits  CBC WITH DIFFERENTIAL/PLATELET  I-STAT CG4 LACTIC ACID, ED  I-STAT TROPONIN, ED    EKG  EKG Interpretation None       Radiology Dg Chest 2 View  Result Date: 04/25/2017 CLINICAL DATA:  Shortness of breath. EXAM: CHEST  2 VIEW COMPARISON:  03/20/2017 FINDINGS: Lungs are hyperexpanded. The lungs are clear without focal pneumonia, edema, pneumothorax or pleural effusion. Architectural distortion/ scarring again noted bilaterally. The cardiopericardial silhouette is within normal limits for size. The visualized bony structures of the thorax are intact. IMPRESSION: Stable.  No acute findings. Electronically Signed   By: Misty Stanley M.D.   On: 04/25/2017 13:30    Procedures Procedures (including critical care time)  Medications Ordered in ED Medications  albuterol (PROVENTIL) (2.5 MG/3ML) 0.083% nebulizer solution 5 mg (5 mg Nebulization Given 04/25/17 1230)     Initial Impression / Assessment and Plan / ED Course  I have reviewed the triage vital signs and the nursing notes.  Pertinent labs & imaging results that were available during my care of the patient were reviewed by me and considered in my medical decision making (see chart for details).     71 yo F with PMHx MAI, bronchiectasis here with severe dyspnea with exertion, worse over past 2-3 days. Vital signs stable on arrival, satting well though dyspneic on  exam. She is tachypneic to the 40s with any ambulation. Suspect this is 2/2 her MAI/bronchiectasis, for which she recently started ABX. Will consult ID re: [email or web address].  D/w Dr. Tommy Medal. Will check CT scan for assessment of infection versus PE. Small chance of immune reaction to starting ABX but must consider alternative etiologies. Pt updated and in  agreement. Plan to f/u scan and re-assess.  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

## 2017-04-27 ENCOUNTER — Other Ambulatory Visit: Payer: Self-pay | Admitting: Internal Medicine

## 2017-04-27 DIAGNOSIS — I519 Heart disease, unspecified: Secondary | ICD-10-CM

## 2017-05-09 ENCOUNTER — Encounter: Payer: Self-pay | Admitting: Internal Medicine

## 2017-05-09 ENCOUNTER — Ambulatory Visit (INDEPENDENT_AMBULATORY_CARE_PROVIDER_SITE_OTHER): Payer: Medicare Other | Admitting: Internal Medicine

## 2017-05-09 DIAGNOSIS — J479 Bronchiectasis, uncomplicated: Secondary | ICD-10-CM | POA: Diagnosis not present

## 2017-05-09 DIAGNOSIS — A310 Pulmonary mycobacterial infection: Secondary | ICD-10-CM | POA: Diagnosis not present

## 2017-05-09 DIAGNOSIS — R5383 Other fatigue: Secondary | ICD-10-CM | POA: Diagnosis not present

## 2017-05-09 MED ORDER — RIFAMPIN 300 MG PO CAPS: 600.0000 mg | ORAL_CAPSULE | ORAL | 5 refills | Status: DC

## 2017-05-09 MED ORDER — AZITHROMYCIN 500 MG PO TABS: 500.0000 mg | ORAL_TABLET | ORAL | 5 refills | Status: DC

## 2017-05-09 MED ORDER — ETHAMBUTOL HCL 400 MG PO TABS: 15.0000 mg/kg | ORAL_TABLET | ORAL | 5 refills | Status: DC

## 2017-05-09 NOTE — Assessment & Plan Note (Signed)
Still symptomatic but stable.  Hopeful for an improvement in next 1-2 months

## 2017-05-09 NOTE — Assessment & Plan Note (Signed)
Likely a combination of the infection and treatment.  As below, will reduce the medications at this point and continue three times per week. I reminded her of the importance of eating.

## 2017-05-09 NOTE — Assessment & Plan Note (Signed)
Still early to see if she is having a response to treatment.  I will change her to three times a week.  If no improvement, will consider IV or inhaled amikacin.   RTC 3 weeks

## 2017-05-09 NOTE — Progress Notes (Signed)
   Subjective:    Patient ID: Gina Lyons, female    DOB: 1946-06-30, 71 y.o.   MRN: [id number]  HPI Here for follow upof MAI and bronchiectasis. Started on rifampin/azith/etham but having difficulty with eating, some nausea with medications.  Taking daily.  Still with same persistent cough.  No significant weight loss but husband continues to express concerns about her eating.     Review of Systems  Constitutional: Positive for appetite change and fatigue. Negative for chills and fever.  Gastrointestinal: Negative for abdominal distention, abdominal pain, diarrhea and nausea.  Skin: Negative for rash.  Neurological: Negative for dizziness.       Objective:   Physical Exam  Constitutional: She appears well-developed and well-nourished. No distress.  Eyes: No scleral icterus.  Cardiovascular: Normal rate, regular rhythm and normal heart sounds.   No murmur heard. Pulmonary/Chest: Effort normal and breath sounds normal. No respiratory distress.  Lymphadenopathy:    She has no cervical adenopathy.  Skin: No rash noted.   SH: no tobacco       Assessment & Plan:

## 2017-05-10 ENCOUNTER — Inpatient Hospital Stay: Admission: RE | Admit: 2017-05-10 | Payer: Medicare Other | Source: Ambulatory Visit

## 2017-05-10 ENCOUNTER — Ambulatory Visit (INDEPENDENT_AMBULATORY_CARE_PROVIDER_SITE_OTHER): Payer: Medicare Other | Admitting: Surgery

## 2017-05-10 VITALS — BP 123/77 | HR 80 | Resp 18 | Ht 62.0 in | Wt 127.0 lb

## 2017-05-10 DIAGNOSIS — I712 Thoracic aortic aneurysm, without rupture, unspecified: Secondary | ICD-10-CM

## 2017-05-11 ENCOUNTER — Encounter: Payer: Self-pay | Admitting: Internal Medicine

## 2017-05-11 ENCOUNTER — Ambulatory Visit (INDEPENDENT_AMBULATORY_CARE_PROVIDER_SITE_OTHER): Payer: Medicare Other | Admitting: Internal Medicine

## 2017-05-11 VITALS — BP 138/80 | HR 61 | Ht 62.0 in | Wt 127.2 lb

## 2017-05-11 DIAGNOSIS — A310 Pulmonary mycobacterial infection: Secondary | ICD-10-CM

## 2017-05-11 DIAGNOSIS — J479 Bronchiectasis, uncomplicated: Secondary | ICD-10-CM

## 2017-05-11 DIAGNOSIS — R0609 Other forms of dyspnea: Secondary | ICD-10-CM | POA: Diagnosis not present

## 2017-05-11 NOTE — Progress Notes (Signed)
Subjective:     Patient ID: Gina Lyons, female   DOB: Jul 06, 1946, 71 y.o.   MRN: [id number]  HPI     71 year old female former smoker, only 3 pack year history, and for pulmonary consult July 2017 for DVT (No PE on CT chest x 3 ) treated with Xarelto.   TEST [name].  Venous Doppler, 03/04/2016 showed DVT in the short segment of left peroneal vein CTA chest , 7/18 2017 negative for pulmonary embolism, bibasilar sub-solid pulmonary nodules measuring up to 7 mm CTA chest 03/19/2016 negative for pulmonary embolism, stable dilation of the ascending aorta CTA chest 03/30/2016 negative for pulmonary emboli, bilateral ground glass opacities, nodular opacities within the lingula, right middle lobe and right lower lobe VQ scan 03/31/16 bilateral patchy matched ventilation/perfusion defects, intermediate  probability for pulmonary embolus. 2D echo 03/31/16>EF 50-55%, gr 1 DD , PAP 29mHg Pulmonary function test on 03/31/2016 showed a FEV1 at 107%, ratio 77, FVC of 105%, no significant bronchodilator response, DLCO 63%  04/12/2016 post hospital follow-up Patient presents for a post hospital follow-up. It was diagnosed with a DVT on the left 03/04/2016. She was started on Xarelto. She was on Estradiol and she stopped this. CT chest on July 18 was negative for PE. Patient had 3 weeks of progressive shortness of breath She was admitted on 03/30/2016. Patient was admitted August 9 through 04/01/2016 for progressive shortness of breath. CT chest was done that was negative for pulmonary embolism. It did show bilateral groundglass opacities  and nodular opacities within the lingula, right middle lobe and right lower lobe. Was transitioned from [place] Eliquis and hospitalization. Patient did undergo gentle diuresis. She was also treated with Levaquin for questionable atypical infection. A VQ scan was done that showed bilateral patchy matched ventilation and perfusion defects with intermittent probability for  pulmonary embolism. She says she was told she had a PE.from this .   A 2-D echo showed a normal EF at 50-55% with grade 1 diastolic dysfunctions and pulmonary artery pressures at 25 m of mercury. Pulmonary function test showed normal lung function with an FEV1 at 107%. Patient did have a diffusing defect =DLCO 63%.  Autoimmune and CTD workup was neg (HIV, ANCA, GBM, CCP , dsDNA ab , ANA )  Discharge. Patient is feeling some better but still gets winded with walking. No dyspnea at rest .  She denies cough, hemoptysis , abd pain, n/v./d. She does have some soreness along left LE.  Says prior to admit she was very active with yard work , cycling and house work. No dyspnea at all.  Prior to admit no long travel, injury , surgery . No hx of DVT/PE . No FH of DVT/PE.  No hx of cancer.     OV 05/13/2016  Chief Complaint  Patient presents with  . Follow-up    Pt here after CXR and seeing TP on 8.22.17. Pt states her SOB has not improved since also OV. PT c/o DOE, intermittent dry cough, chest tightness when SOB and resolves when she lays down. Pt states she had mild left foot swelling on 9.21.17, this has resolved. Pt denies f/c/s.     [name]89year old female with very limited remote smoking. In July 2017 she was admitted with left-sided DVT and was started on Xarelto. Sometime after that according to her history she started developing shortness of breath which has persisted to this day despite changes with Xarelto to es.E;liquis. This dyspnea is pervasive. It is moderate  to severe in intensity. It is present for exertion and relieved by rest. Class II-III levels of activity makes her dyspneic. There is no orthopnea paroxysmal nocturnal dyspnea. She had 3 CT scans of the chest in July 2017 that ruled out pulmonary embolism but in August 2017 she did have some new groundglass opacities. August 2017 VQ scan was of intermediate probability. Only function does show reduction in diffusion capacity  suggesting the groundglass opacities might be real. She did have autoimmune workup in August 2017 along with complement and vascular this workup that was normal. She presents today with her husband. She is really frustrated by her dyspnea. Chest x-ray personally visualized shows some nonspecific scarring. She is not able to do her household activities.  she has a dry cough of mild intensity for the last 6 months. Cough preceded onset of dyspnea and DVT. At last visit they specifically denied this cough. This no associated wheezing or orthopnea paroxysmal nocturnal dyspnea or chills.    Exhaled nitric oxide testing today in the office 05/13/2016: 23 ppb and normal   Walking desaturation test on 05/13/2016 185 feet x 3 laps:   did NOT desaturate. Rest pulse ox was 100%, final pulse ox was 100% was the final pulse ox w heart rate response was at rest78. HR response  to 94/min at peak exertion.    Noted SCL 70 not done (no dysphagia)  D dimer this visti 0.36 and n  ormal  Dg Chest 2 View  Result Date: 05/13/2016 CLINICAL DATA:  Follow-up pulmonary infiltrates. EXAM: CHEST  2 VIEW COMPARISON:  04/07/2016 and 03/31/2016 FINDINGS: Mild hyperinflation is stable. Stable linear densities in the anterior left lung. No significant airspace disease or consolidation. Heart and mediastinum are within normal limits and stable. Surgical plate in the lower cervical spine. Trachea is midline. No large pleural effusions. Old left seventh rib fracture. IMPRESSION: Stable linear densities in the left lung are probably related to scarring. No acute cardiopulmonary disease. Electronically Signed   By: Markus Daft M.D.   On: 05/13/2016 14:25       OV 06/01/2016  Chief Complaint  Patient presents with  . Follow-up    Pt here after CPST. Pt states her SOB has slightly improved since last OV. Pt c/o dry cough and occassional chest tightness - under breasts after eating dinner in evening. Pt denies chest congestion  and f/c/s.     Dyspnea evaluation. Here for review of pulmonary stress test. She underwent pulmonary stress test 05/26/2016. She gave an excellent effort. She had normal VO2 max and anaerobic threshold. Nevertheless there is a flattening of her O2 pulse curve that is significant. This is associated with tachypnea and dead space ventilation and approaching ventilatory limits. Pulse ox did not go down. In summary this is a combinati    She is reporting weight gain since a deep vein thrombosis. So far pulmonary embolism has never been confirmed. In fact the CT chest in October 2017 only shows groundglass opacities. Echocardiogram shows diastolic dysfunction. In addition after coming off her estrogen which she was taking for many years [in July 2017 after DVT] she is reporting significant hot flashes which are frustrating her. She continues to suffer from dyspnea and is frustrated by this as well.     OV 08/10/2016  Chief Complaint  Patient presents with  . Follow-up    Pt states overall her SOB has improved but has bad days every now and then. Pt c/o more SOB today and  occassional dry cough with some  frank blood - size of penny. Pt also states she has chest tightness when she is SOB - this resolves with rest.     Follow-up dyspnea: We finally determined that this dyspnea was unlikely due to pulmonary embolism. Was related to diastolic dysfunction and pulmonary groundglass infiltrates sustained in July 2017 at the time of deep vein thrombosis diagnosis. Last visit October 2017 I started Lasix therapy. Since then with the passage of time and Lasix therapy she has improved with her dyspnea. She still does have mild dyspnea on exertion. She has not attended pulmonary rehabilitation. She does not want to go to pulmonary habitation due to logistical issues and time issues. She and her husband said they will start exercising on a road bike for an exercise bike by themselves gradually.  In terms of deep  vein thrombosis related to estrogen intake.. She continues to be on anticoagulation since July 2017. She's completed 5 months of therapy. Interim d-dimer  normal. She has no bleeding complications. She wants to stop anticoagulation.  OV 10/25/2016  Chief Complaint  Patient presents with  . Follow-up    Pt here after HRCT. pt states her breathing is unchanged since last OV. Pt denies cough and CP/tightness.     Follow-up dyspnea: At last visit she told me that with Lasix therapy that her dyspnea improved. Today she tells me that she has now plateaued in terms of improvement. She still has dyspnea on exertion relieved by rest. The Lasix is helping but without the Lasix it is worse. It at the same time she feels she has not returned to her baseline pre-summer 2017. High-resolution CT scan of the chest done today and personally visualized shows the following abnormalities described suggestive of MAI infection  In terms of the left lower extremity DVT: In December 2017 we stopped anticoag based on normal D-dimer. She says since summer 2017 continues with daily left pain that sarts in ankle and spreads up to popliteal area along the venous course. On February 2018 on the 21st she had Doppler ultrasound of the left lower extremity that did not show any DVT. She also had chemistries that are normal. She takes Naprosyn once daily for this. She says all this is new since the DVT. She is frustrated by the pain. She says that primary care has deferred this issue to me also notices associated edema. She is wondering about vascular insufficiency and is requesting whether she should be referred to a vascular evaluation  Ct Chest High Resolution  Result Date: 10/25/2016 CLINICAL DATA:  Dyspnea and wheezing. Former smoker. History of pulmonary embolism and DVT. Follow-up abnormal chest CT. EXAM: CT CHEST WITHOUT CONTRAST TECHNIQUE: Multidetector CT imaging of the chest was performed following the standard protocol  without intravenous contrast. High resolution imaging of the lungs, as well as inspiratory and expiratory imaging, was performed. COMPARISON:  03/30/2016 chest CT angiogram. 07/08/2016 chest radiograph. FINDINGS: Cardiovascular: Normal heart size. No significant pericardial fluid/thickening. Left anterior descending coronary atherosclerosis. Atherosclerotic thoracic aorta with stable mild ectasia of the ascending thoracic aorta (maximum diameter 3.9 cm). Normal caliber pulmonary arteries. Mediastinum/Nodes: Status post left hemithyroidectomy. No discrete right thyroid lobe nodules. Unremarkable esophagus. No pathologically enlarged axillary, mediastinal or gross hilar lymph nodes, noting limited sensitivity for the detection of hilar adenopathy on this noncontrast study. Lungs/Pleura: No pneumothorax. No pleural effusion. There is mild-to-moderate cylindrical and varicoid bronchiectasis extensively distributed throughout both lungs, most prominent in the basilar right upper lobe,  right middle lobe and lingula. There is extensive patchy tree-in-bud and centrilobular nodularity throughout both lungs at the areas of bronchiectasis, which appears overall mildly worsened since 03/30/2016, including a new dominant nodular 13 x 11 mm opacity in the medial dependent right lower lobe (series 3/ image 76). Bandlike subpleural opacities at the areas of bronchiectasis in the basilar right upper lobe, medial segment right middle lobe and lingula are stable and compatible with postinfectious scars. Otherwise no acute consolidative airspace disease or lung masses. Mild centrilobular emphysema. Mild patchy air trapping in both lungs on the expiration sequence. No frank honeycombing. Upper abdomen: Partially visualized left parapelvic renal cysts. Musculoskeletal: No aggressive appearing focal osseous lesions. Partially visualized surgical hardware from ACDF in the lower cervical spine. Stable T9 vertebral hemangioma. Mild thoracic  spondylosis. IMPRESSION: 1. 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 Findings have mildly worsened in the interval since 03/30/2016, including a new dominant 13 mm nodular opacity in the right lower lobe. Recommend initial follow-up post treatment high-resolution chest CT in 6 months. 2. Mild patchy air trapping in both lungs, suggesting small airways disease. 3. Aortic atherosclerosis.  One vessel coronary atherosclerosis. Electronically Signed   By: Ilona Sorrel M.D.   On: 10/25/2016 10:07    OV 12/20/2016  Chief Complaint  Patient presents with  . Follow-up    Pt here after bronch. Pt states her breathing is unchanged since last OV. Pt c/o dry cough and chest heaviness. Pt denies f/c/s and CP/tightness.    Follow-up pulmonary infiltrates not otherwise specified: She underwent bronchoscopy with bronchoalveolar lavage of July 2018 which showed 57% monocytes and 26% neutrophils. Cultures are negative except for colonizing contaminant mold ; low colony count. Last CT scan was March 2018  Follow-up dyspnea: She continues to have dyspnea. She continues to insist that is all after the DVT. There are no new issues. She is planning to exercise [place]    03/20/2017 acute extended ov/Wert re: sob at rest and dry cough x one year  Chief Complaint  Patient presents with  . Acute Visit    Increased SOB, no energy, wheezing and non prod cough x 6 wks. Her cough is non prod, and esp worse at night, sometimes wakes her up.  No appetite.    after last ov with MR 12/20/16 (no change rx) Breathing was better for two weeks only if sitting but doe  x 50 ft  And mailbox and stops  200 ft flat ever since dvt "PE"  Since then sob at rest unless sleeping p diazepam and "sleeping pill" and tramadol not aware of noct symptoms but after wakes each day  Always stops due to wob just walking to kitchen  Cough worse/ very harsh dry day > noct  PC rec prozac higher dose but no better so back to 10 mg daily  No better on saba hfa/ neb   No obvious patterns in day to day or daytime variability or assoc excess/ purulent sputum or mucus plugs or hemoptysis or cp or chest tightness,  or overt sinus or hb symptoms. No unusual exp hx or h/o childhood pna/ asthma or knowledge of premature birth.    OV 03/28/2017  Chief Complaint  Patient presents with  . Follow-up  Pt saw MW on 7.30.2018 for an acute visit. Pt states her SOB has not improved since last OV. Pt states she still has a dry cough. Pt denies CP/tightness, f/c/s, chest congestion.    Rhemi Balbach is had long-standing shortness of breath that remained somewhat unexplained. The onset was in summer 2017 at the time of admission for DVT. There is no clear-cut evidence of pulmonary embolism. She did have some groundglass opacities at that time. Initially diuresis helped but subsequently did not. Over time her CT scan of the chest to change to the extent that in spring 2018 dose addition she had MAI infection. I did a bronchoscopy in April 2018 and then saw her for follow-up in May 2018. At this point in time the cultures were negative but subsequently in June 2018 the cultures returned showing MAI infection. I had a phone conversation with her and we opted to follow because she was feeling better. Now she presents for follow-up since her visit in May 2018 she has had worsening shortness of breath. In the interim she even saw my colleague Dr. Legrand Como wert for worsening shortness of breath. Walking desaturation test 185 feet 3 laps on room air with a full head probe: d walk only one lap and got very short of breath. She was short of breath  for the remaining 510 minutes of a conversation in the room. She did not desaturate below 100% and she did not get tachycardic. She is also reporting severe associated significant fatigue. Dyspnea and symptoms are worse in the last few months. During household work is extremely tiring. She says the fatigue has been worked up extensively by her primary care physician without any obvious cause  Last d-dimer September 2017 was normal. normal. She's currently not on anticoagulation  Echocardiogram August 2017 with ejection fraction 55% with normal nuclear medicine cardiac stress test November 2017  Normal allergy tests July 2018  Chest x-ray 03/20/2017: Personally visualized. Chronic bronchitic changes. Radiology second running high resolution CT chest.  Results for TISHANA, CLINKENBEARD (MRN [id number]) as of 03/28/2017 09:26  Ref. Range 11/28/2016 15:35  ACID FAST SMEAR (AFB) Unknown Rpt  ACID FAST CULTURE WITH REFLEXED SENSITIVITIES Unknown Rpt (A)  Acid Fast Culture Unknown Positive (A)  Organism ID Unknown Comment (A)  MAC SUSCEPTIBILITY BROTH   Unknown Rpt (A)  AFB ORGANISM ID BY DNA PROBE Unknown Rpt (A)   IMPRESSION: 1. 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 Findings have mildly worsened in the interval since 03/30/2016, including a new dominant 13 mm nodular opacity in the right lower lobe. Recommend initial follow-up post treatment high-resolution chest CT in 6 months. 2. Mild patchy air trapping in both lungs, suggesting small airways disease. 3. Aortic atherosclerosis.  One vessel coronary atherosclerosis.   Electronically Signed   By: Ilona Sorrel M.D.   On: 10/25/2016  10:07   OV 05/11/2017  Chief Complaint  Patient presents with  . Follow-up    Pt states that she still has tingling and pain in her ankle from the blood clot. Still becomes SOB on exertion, but is doing better. Does have a lot of dry coughing. Denies any CP.   Follow-up dyspnea that is now deemed due to bronchiectasis associated with MAI infection. At last visit d-dimer was high we did a  CT angiogram ruled out pulmonary embolism but did show bronchiectasis associated with MAI infection. I referred her to infectious diseases Dr.: She is now on MAI treatment. This is causing significant nausea and fatigue. The trying different protocols and it seems to be helping. With the MAI treatment her dyspnea might be better but her cough is definitely not worse. In association she also has other nonspecific different cans of symptoms. She did complain that last night her left cough is a bit swollen but rate is better. This no warmth. She does not want any duplex ultrasound for this at this point. Review of the notes indicate that have not tried inhaler therapy for her in the past. Of note she is finished a flu shot for this season    has a past medical history of Anxiety; Arthritis; Chronic lower back pain; Diastolic dysfunction; DVT (deep venous thrombosis) ([place]); DVT (deep venous thrombosis) ([place]); Dyspnea on exertion; Family history of adverse reaction to anesthesia; History of blood transfusion (1964); Hypertension; Hypothyroidism; Migraine; Pneumonia (02/2016); and Tumor, thyroid.   reports that she quit smoking about 41 years ago. Her smoking use included Cigarettes. She has a 3.00 pack-year smoking history. She has never used smokeless tobacco.  Past Surgical History:  Procedure Laterality Date  . BACK SURGERY    . BILATERAL SALPINGOOPHORECTOMY Bilateral   . CARPAL TUNNEL RELEASE Right   . DILATION AND CURETTAGE OF UTERUS    . HEMORRHOID SURGERY    . [place] SURGERY  2013?  [facility] POSTERIOR  CERVICAL FUSION/FORAMINOTOMY    . REFRACTIVE SURGERY Bilateral   . TOTAL THYROIDECTOMY  ?1974  . VAGINAL HYSTERECTOMY    . VIDEO BRONCHOSCOPY Bilateral 11/28/2016   Procedure: VIDEO BRONCHOSCOPY WITHOUT FLUORO;  Surgeon: Brand Males, MD;  Location: [facility];  Service: Cardiopulmonary;  Laterality: Bilateral;    Allergies  Allergen Reactions  . Imitrex [Sumatriptan] Other (See Comments)    syncope  . Latex     Blisters from prolonged exposure to latex  . Celebrex [Celecoxib] Other (See Comments)    palpations   . Codeine Itching  . Percocet [Oxycodone-Acetaminophen] Itching    Immunization History  Administered Date(s) Administered  . Influenza, High Dose Seasonal PF 04/12/2016, 05/10/2017  . Pneumococcal Polysaccharide-23 03/31/2016    Family History  Problem Relation Age of Onset  . Cancer Sister        brain tumor  . Cancer Brother        unknown ca  . Kidney disease Mother   . COPD Father        smoker     Current Outpatient Prescriptions:  .  amLODipine (NORVASC) 5 MG tablet, Take 5 mg by mouth at bedtime., Disp: , Rfl:  .  aspirin EC 81 MG tablet, Take 81 mg by mouth daily., Disp: , Rfl:  .  azithromycin (ZITHROMAX) 500 MG tablet, Take 1 tablet (500 mg total) by mouth 3 (three) times a week. Take Mon/Wed/Fri, Disp: 12 tablet, Rfl: 5 .  Biotin 5 MG CAPS, Take 5 mg by mouth daily., Disp: , Rfl:  .  diazepam (VALIUM) 10 MG tablet, Take 10 mg by mouth at bedtime. , Disp: , Rfl:  .  doxylamine, Sleep, (SLEEP AID) 25 MG tablet, Take 25 mg by mouth at bedtime., Disp: , Rfl:  .  ethambutol (MYAMBUTOL) 400 MG tablet, Take 2 tablets (800 mg total) by mouth 3 (three) times a week., Disp: 24 tablet, Rfl: 5 .  famotidine (PEPCID) 20 MG tablet, One  at bedtime, Disp: 30 tablet, Rfl: 11 .  fexofenadine (ALLEGRA) 180 MG tablet, Take 180 mg by mouth daily., Disp: , Rfl:  .  FLUoxetine (PROZAC) 10 MG capsule, Take 1 capsule by mouth daily., Disp: , Rfl:  .  furosemide  (LASIX) 20 MG tablet, TAKE 1 TABLET BY MOUTH ONCE DAILY, Disp: 90 tablet, Rfl: 0 .  levothyroxine (SYNTHROID, LEVOTHROID) 112 MCG tablet, Take 112 mcg by mouth daily before breakfast. Brand name only**, Disp: , Rfl:  .  pantoprazole (PROTONIX) 40 MG tablet, Take 1 tablet (40 mg total) by mouth daily. Take 30-60 min before first meal of the day, Disp: 30 tablet, Rfl: 2 .  rifampin (RIFADIN) 300 MG capsule, Take 2 capsules (600 mg total) by mouth 3 (three) times a week. Take Tue/Thur/Sat, Disp: 24 capsule, Rfl: 5 .  sodium chloride (OCEAN) 0.65 % SOLN nasal spray, Place 1 spray into both nostrils at bedtime as needed for congestion., Disp: , Rfl:    Review of Systems     Objective:   Physical Exam  Constitutional: She is oriented to person, place, and time. She appears well-developed and well-nourished. No distress.  HENT:  Head: Normocephalic and atraumatic.  Right Ear: External ear normal.  Left Ear: External ear normal.  Mouth/Throat: Oropharynx is clear and moist. No oropharyngeal exudate.  Eyes: Pupils are equal, round, and reactive to light. Conjunctivae and EOM are normal. Right eye exhibits no discharge. Left eye exhibits no discharge. No scleral icterus.  Neck: Normal range of motion. Neck supple. No JVD present. No tracheal deviation present. No thyromegaly present.  Cardiovascular: Normal rate, regular rhythm, normal heart sounds and intact distal pulses.  Exam reveals no gallop and no friction rub.   No murmur heard. Pulmonary/Chest: Effort normal and breath sounds normal. No respiratory distress. She has no wheezes. She has no rales. She exhibits no tenderness.  scattred crackles  Abdominal: Soft. Bowel sounds are normal. She exhibits no distension and no mass. There is no tenderness. There is no rebound and no guarding.  Musculoskeletal: Normal range of motion. She exhibits no edema or tenderness.  Lymphadenopathy:    She has no cervical adenopathy.  Neurological: She is  alert and oriented to person, place, and time. She has normal reflexes. No cranial nerve deficit. She exhibits normal muscle tone. Coordination normal.  Skin: Skin is warm and dry. No rash noted. She is not diaphoretic. No erythema. No pallor.  Psychiatric: She has a normal mood and affect. Her behavior is normal. Judgment and thought content normal.  Vitals reviewed.  Vitals:   05/11/17 1209  BP: 138/80  Pulse: 61  SpO2: 95%  Weight: 127 lb 3.2 oz (57.7 kg)  Height: _0  (1.575 m)    Estimated body mass index is 23.27 kg/m as calculated from the following:   Height as of this encounter: _1  (1.575 m).   Weight as of this encounter: 127 lb 3.2 oz (57.7 kg).     Assessment:       ICD-10-CM   1. Bronchiectasis without complication ([place]) Q76.1   2. 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 Dyspnea on exertion R06.09        Plan:       Continue MAI treatment with Dr Linus Salmons - balancing benefit v side effect Try spiriva respimat (or incruse or tudorza) - for 1 month; take sample  - call in few weeks. IF helping can will call in prescriptions Glad you had flu shot  already  Followup 6 months or sooner if needed   Dr. Brand Males, M.D., [facility].C.P Pulmonary and Critical Care Medicine Staff Physician [facility] Pager: [phone], If no answer or between  15:00h - 7:00h: call [phone]  05/11/2017 12:29 PM

## 2017-05-11 NOTE — Patient Instructions (Addendum)
ICD-10-CM   1. Bronchiectasis without complication ([place]) L57.9   2. 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 Dyspnea on exertion R06.09     Continue MAI treatment with Dr Linus Salmons - balancing benefit v side effect Try spiriva respimat (or incruse or tudorza) - for 1 month; take sample  - call in few weeks. IF helping can will call in prescriptions Glad you had flu shot already  Followup 6 months or sooner if needed

## 2017-05-15 ENCOUNTER — Encounter: Payer: Self-pay | Admitting: Surgery

## 2017-05-15 NOTE — Progress Notes (Signed)
HPI:  The patient returns today for follow up of her ascending aortic aneurysm that was 4.1 cm when I saw her last year. She has a trileaflet aortic valve with trivial AI. She also has bilateral ground-glass and nodular pulmonary densities that are felt to be due to MAI and she is being treated by Dr. Linus Salmons with a three drug combination which she says leaves her with no appetite and feeling poorly.  Current Outpatient Prescriptions  Medication Sig Dispense Refill  . amLODipine (NORVASC) 5 MG tablet Take 5 mg by mouth at bedtime.    Marland Kitchen aspirin EC 81 MG tablet Take 81 mg by mouth daily.    Marland Kitchen azithromycin (ZITHROMAX) 500 MG tablet Take 1 tablet (500 mg total) by mouth 3 (three) times a week. Take Mon/Wed/Fri 12 tablet 5  . Biotin 5 MG CAPS Take 5 mg by mouth daily.    . diazepam (VALIUM) 10 MG tablet Take 10 mg by mouth at bedtime.     Marland Kitchen doxylamine, Sleep, (SLEEP AID) 25 MG tablet Take 25 mg by mouth at bedtime.    Marland Kitchen ethambutol (MYAMBUTOL) 400 MG tablet Take 2 tablets (800 mg total) by mouth 3 (three) times a week. 24 tablet 5  . famotidine (PEPCID) 20 MG tablet One at bedtime 30 tablet 11  . fexofenadine (ALLEGRA) 180 MG tablet Take 180 mg by mouth daily.    Marland Kitchen FLUoxetine (PROZAC) 10 MG capsule Take 1 capsule by mouth daily.    . furosemide (LASIX) 20 MG tablet TAKE 1 TABLET BY MOUTH ONCE DAILY 90 tablet 0  . levothyroxine (SYNTHROID, LEVOTHROID) 112 MCG tablet Take 112 mcg by mouth daily before breakfast. Brand name only**    . pantoprazole (PROTONIX) 40 MG tablet Take 1 tablet (40 mg total) by mouth daily. Take 30-60 min before first meal of the day 30 tablet 2  . rifampin (RIFADIN) 300 MG capsule Take 2 capsules (600 mg total) by mouth 3 (three) times a week. Take Tue/Thur/Sat 24 capsule 5  . sodium chloride (OCEAN) 0.65 % SOLN nasal spray Place 1 spray into both nostrils at bedtime as needed for congestion.     No current facility-administered medications for this visit.       Physical Exam: BP 123/77   Pulse 80   Resp 18   Ht 5\' 2"  (1.575 m)   Wt 127 lb (57.6 kg)   BMI 23.23 kg/m  She looks weak Cardiac exam shows a regular rate and rhythm with normal heart sounds Lungs are clear  Diagnostic Tests:  CLINICAL DATA:  Shortness of Breath  EXAM: CT ANGIOGRAPHY CHEST WITH CONTRAST  TECHNIQUE: Multidetector CT imaging of the chest was performed using the standard protocol during bolus administration of intravenous contrast. Multiplanar CT image reconstructions and MIPs were obtained to evaluate the vascular anatomy.  CONTRAST:  100 mL Isovue 370 nonionic  COMPARISON:  Chest CT March 28, 2017; chest radiograph April 25, 2017  FINDINGS: Cardiovascular: There is no demonstrable pulmonary embolus. Ascending thoracic aorta again has a maximum transverse diameter of 4.0 x 3.9 cm. There is no evident thoracic aortic dissection. Visualized great vessels appear unremarkable except for slight atherosclerotic calcification in the proximal left subclavian artery, stable. There are foci of atherosclerotic calcification in the aorta. There is no appreciable pericardial thickening. There is a small amount of calcification in the left anterior descending coronary artery.  Mediastinum/Nodes: Left lobe of thyroid absent. Right lobe of thyroid somewhat diminutive, stable. There is  no appreciable thoracic adenopathy. There is a small hiatal hernia evident.  Lungs/Pleura: There is stable bilateral bronchiectatic change. There is a degree of underlying centrilobular emphysematous change. There is patchy atelectatic change in both lower lobes, stable. There are areas of lower lobe and inferior lingular tree on bud type appearance which has not progressed compared to prior study. Scattered nodular opacities are again noted, stable. The largest nodular opacity seen currently is present in the superior segment of the right lower lobe on axial slice  47 series 7 measuring 7 x 7 mm, stable. There is a nodular opacity more medially in the superior segment of the right lower lobe measuring 6 x 6 mm, unchanged. Smaller nodular opacities are noted as well. No new nodular opacities are evident on this study. There is no new consolidation. There is no pleural effusion or pleural thickening evident.  Upper Abdomen: Atherosclerotic calcification is noted in the aorta. There are probable parapelvic cysts in the left kidney, incompletely visualized.  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 No blastic or lytic bone lesions are evident. Postoperative change in the lower cervical spine is incompletely visualized.  Review of the MIP images confirms the above findings.  IMPRESSION: 1.  No demonstrable pulmonary embolus.  2. Ascending thoracic aorta has a measured transverse diameter 4.0 x 3.9 cm. No dissection evident. Foci aortic atherosclerosis. There is mild left anterior descending coronary artery calcification. Recommend annual imaging followup by CTA or MRA. This recommendation follows 2010 ACCF/AHA/AATS/ACR/ASA/SCA/SCAI/SIR/STS/SVM Guidelines for the Diagnosis and Management of Patients with Thoracic Aortic Disease. Circulation. 2010; 121: B048-G891.  3. Lung parenchyma appears stable compared to study from 1 month prior. Stable areas of atelectasis and bronchiectatic change. 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 Underlying centrilobular emphysema. Nodular opacities appear stable, largest 7 x 7 mm. No new pulmonary nodular opacity. No new area of opacity in the lungs.  4.  Small hiatal hernia.  5.  No evident adenopathy.  Aortic Atherosclerosis (ICD10-I70.0) and Emphysema (ICD10-J43.9).   Electronically Signed   By: Lowella Grip III M.D.   On: 04/25/2017  17:29  Impression:  She has a stable 4.0 cm fusiform ascending aortic aneurysm. This can be followed up in one year with a CT scan of the chest. Her BP is under good control. I reviewed the CT images with her and her husband and answered their questions.  Plan:  I will see her back in one year with a high resolution CT scan of the chest to follow up on her aneurysm as well as her lung disease.   I spent 15 minutes performing this established patient evaluation and > 50% of this time was spent face to face counseling and coordinating the care of this patient's aortic aneurysm.    Gaye Pollack, MD [facility] [phone]

## 2017-05-23 ENCOUNTER — Other Ambulatory Visit: Payer: Self-pay | Admitting: Internal Medicine

## 2017-05-23 ENCOUNTER — Encounter: Payer: Self-pay | Admitting: *Deleted

## 2017-05-30 ENCOUNTER — Encounter: Payer: Self-pay | Admitting: Internal Medicine

## 2017-05-30 ENCOUNTER — Ambulatory Visit (INDEPENDENT_AMBULATORY_CARE_PROVIDER_SITE_OTHER): Payer: Medicare Other | Admitting: Internal Medicine

## 2017-05-30 DIAGNOSIS — J479 Bronchiectasis, uncomplicated: Secondary | ICD-10-CM | POA: Diagnosis not present

## 2017-05-30 DIAGNOSIS — A310 Pulmonary mycobacterial infection: Secondary | ICD-10-CM

## 2017-05-30 DIAGNOSIS — K219 Gastro-esophageal reflux disease without esophagitis: Secondary | ICD-10-CM

## 2017-05-30 NOTE — Progress Notes (Signed)
   Subjective:    Patient ID: Gina Lyons, female    DOB: [birth year], 71 y.o.   MRN: [id number]  HPI Here for follow upof MAI and bronchiectasis. Started on rifampin/azith/etham but having difficulty with eating, some nausea with medications daily. I changed her to three times per week and she is tolerating much better and is taking with pantoprazole 40 mg provided by Dr. Melvyn Novas.  Her cough is also much better and she now rarely wakes up due to the cough.  She is having less productive sputum and more active.  Eating better.  No associated abdominal pain.    Review of Systems  Constitutional: Negative for chills and fever.       Improved appetite  Gastrointestinal: Negative for abdominal pain, diarrhea and nausea.  Skin: Negative for rash.  Neurological: Negative for dizziness.       Objective:   Physical Exam  Constitutional: She appears well-developed and well-nourished. No distress.  Eyes: No scleral icterus.  Cardiovascular: Normal rate, regular rhythm and normal heart sounds.   No murmur heard. Pulmonary/Chest: Effort normal and breath sounds normal. No respiratory distress.  Lymphadenopathy:    She has no cervical adenopathy.  Skin: No rash noted.   SH: no tobacco (remote history)       Assessment & Plan:

## 2017-05-30 NOTE — Assessment & Plan Note (Signed)
Will continue with the three drug regimen and recheck the sputum next visit.

## 2017-05-30 NOTE — Assessment & Plan Note (Signed)
She is improved now symptomatically with the MAI treatment.  Followed by Dr. Chase Caller.

## 2017-05-30 NOTE — Assessment & Plan Note (Signed)
Related to her medications and now on pantoprazole 40 mg and doing much better.  Will continue with this.  No significant interatction.

## 2017-05-31 ENCOUNTER — Other Ambulatory Visit: Payer: Self-pay | Admitting: Internal Medicine

## 2017-05-31 DIAGNOSIS — R0609 Other forms of dyspnea: Principal | ICD-10-CM

## 2017-06-19 ENCOUNTER — Encounter: Payer: Self-pay | Admitting: Internal Medicine

## 2017-06-19 ENCOUNTER — Ambulatory Visit (INDEPENDENT_AMBULATORY_CARE_PROVIDER_SITE_OTHER): Payer: Medicare Other | Admitting: Internal Medicine

## 2017-06-19 VITALS — BP 120/70 | HR 71 | Ht 62.0 in | Wt 127.0 lb

## 2017-06-19 DIAGNOSIS — A310 Pulmonary mycobacterial infection: Secondary | ICD-10-CM

## 2017-06-19 DIAGNOSIS — R0609 Other forms of dyspnea: Secondary | ICD-10-CM | POA: Diagnosis not present

## 2017-06-19 MED ORDER — BUDESONIDE 90 MCG/ACT IN AEPB: 1.0000 | INHALATION_SPRAY | Freq: Two times a day (BID) | RESPIRATORY_TRACT | 0 refills | Status: DC

## 2017-06-19 NOTE — Patient Instructions (Signed)
ICD-10-CM   1. Bronchiectasis without complication ([place]) L75.3   2. 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 Dyspnea on exertion R06.09     Continue MAI treatment with Dr Linus Salmons - balancing benefit v side effect Too bad spiriva gave you "shakes"  Try sample pulmicort 23mcg 2 puff twice daily and if helping call us in few weeks to get script Please talk to PCP Jani Gravel, MD\ about leg pain  Followup 6 months or sooner if needed (cancel interim appointments)

## 2017-06-19 NOTE — Addendum Note (Signed)
Addended by: Lorretta Harp on: 06/19/2017 09:27 AM   Modules accepted: Orders

## 2017-06-19 NOTE — Progress Notes (Signed)
Subjective:     Patient ID: Gina Lyons, female   DOB: November 07, 1945, 71 y.o.   MRN: [id number]  HPI HPI     71 year old female former smoker, only 3 pack year history, and for pulmonary consult July 2017 for DVT (No PE on CT chest x 3 ) treated with Xarelto.   TEST Gina Lyons.  Venous Doppler, 03/04/2016 showed DVT in the short segment of left peroneal vein CTA chest , 7/18 2017 negative for pulmonary embolism, bibasilar sub-solid pulmonary nodules measuring up to 7 mm CTA chest 03/19/2016 negative for pulmonary embolism, stable dilation of the ascending aorta CTA chest 03/30/2016 negative for pulmonary emboli, bilateral ground glass opacities, nodular opacities within the lingula, right middle lobe and right lower lobe VQ scan 03/31/16 bilateral patchy matched ventilation/perfusion defects, intermediate  probability for pulmonary embolus. 2D echo 03/31/16>EF 50-55%, gr 1 DD , PAP 54mHg Pulmonary function test on 03/31/2016 showed a FEV1 at 107%, ratio 77, FVC of 105%, no significant bronchodilator response, DLCO 63%  04/12/2016 post hospital follow-up Patient presents for a post hospital follow-up. It was diagnosed with a DVT on the left 03/04/2016. She was started on Xarelto. She was on Estradiol and she stopped this. CT chest on July 18 was negative for PE. Patient had 3 weeks of progressive shortness of breath She was admitted on 03/30/2016. Patient was admitted August 9 through 04/01/2016 for progressive shortness of breath. CT chest was done that was negative for pulmonary embolism. It did show bilateral groundglass opacities  and nodular opacities within the lingula, right middle lobe and right lower lobe. Was transitioned from X[place]es[place]too Eliquis and hospitalization. Patient did undergo gentle diuresis. She was also treated with Levaquin for questionable atypical infection. A VQ scan was done that showed bilateral patchy matched ventilation and perfusion defects with intermittent probability  for pulmonary embolism. She says she was told she had a PE.from this .   A 2-D echo showed a normal EF at 50-55% with grade 1 diastolic dysfunctions and pulmonary artery pressures at 25 m of mercury. Pulmonary function test showed normal lung function with an FEV1 at 107%. Patient did have a diffusing defect =DLCO 63%.  Autoimmune and CTD workup was neg (HIV, ANCA, GBM, CCP , dsDNA ab , ANA )  Discharge. Patient is feeling some better but still gets winded with walking. No dyspnea at rest .  She denies cough, hemoptysis , abd pain, n/v./d. She does have some soreness along left LE.  Says prior to admit she was very active with yard work , cycling and house work. No dyspnea at all.  Prior to admit no long travel, injury , surgery . No hx of DVT/PE . No FH of DVT/PE.  No hx of cancer.     OV 05/13/2016  Chief Complaint  Patient presents with  . Follow-up    Pt here after CXR and seeing TP on 8.22.17. Pt states her SOB has not improved since also OV. PT c/o DOE, intermittent dry cough, chest tightness when SOB and resolves when she lays down. Pt states she had mild left foot swelling on 9.21.17, this has resolved. Pt denies Gina/c/s.     [name]75year old female with very limited remote smoking. In July 2017 she was admitted with left-sided DVT and was started on Xarelto. Sometime after that according to her history she started developing shortness of breath which has persisted to this day despite changes with Xarelto to es.E;liquis. This dyspnea is pervasive. It is  moderate to severe in intensity. It is present for exertion and relieved by rest. Class II-III levels of activity makes her dyspneic. There is no orthopnea paroxysmal nocturnal dyspnea. She had 3 CT scans of the chest in July 2017 that ruled out pulmonary embolism but in August 2017 she did have some new groundglass opacities. August 2017 VQ scan was of intermediate probability. Only function does show reduction in diffusion  capacity suggesting the groundglass opacities might be real. She did have autoimmune workup in August 2017 along with complement and vascular this workup that was normal. She presents today with her husband. She is really frustrated by her dyspnea. Chest x-ray personally visualized shows some nonspecific scarring. She is not able to do her household activities.  she has a dry cough of mild intensity for the last 6 months. Cough preceded onset of dyspnea and DVT. At last visit they specifically denied this cough. This no associated wheezing or orthopnea paroxysmal nocturnal dyspnea or chills.    Exhaled nitric oxide testing today in the office 05/13/2016: 23 ppb and normal   Walking desaturation test on 05/13/2016 185 feet x 3 laps:   did NOT desaturate. Rest pulse ox was 100%, final pulse ox was 100% was the final pulse ox w heart rate response was at rest78. HR response  to 94/min at peak exertion.    Noted SCL 70 not done (no dysphagia)  D dimer this visti 0.36 and n  ormal  Dg Chest 2 View  Result Date: 05/13/2016 CLINICAL DATA:  Follow-up pulmonary infiltrates. EXAM: CHEST  2 VIEW COMPARISON:  04/07/2016 and 03/31/2016 FINDINGS: Mild hyperinflation is stable. Stable linear densities in the anterior left lung. No significant airspace disease or consolidation. Heart and mediastinum are within normal limits and stable. Surgical plate in the lower cervical spine. Trachea is midline. No large pleural effusions. Old left seventh rib fracture. IMPRESSION: Stable linear densities in the left lung are probably related to scarring. No acute cardiopulmonary disease. Electronically Signed   By: Markus Daft M.D.   On: 05/13/2016 14:25       OV 06/01/2016  Chief Complaint  Patient presents with  . Follow-up    Pt here after CPST. Pt states her SOB has slightly improved since last OV. Pt c/o dry cough and occassional chest tightness - under breasts after eating dinner in evening. Pt denies chest  congestion and Gina/c/s.     Dyspnea evaluation. Here for review of pulmonary stress test. She underwent pulmonary stress test 05/26/2016. She gave an excellent effort. She had normal VO2 max and anaerobic threshold. Nevertheless there is a flattening of her O2 pulse curve that is significant. This is associated with tachypnea and dead space ventilation and approaching ventilatory limits. Pulse ox did not go down. In summary this is a combinati    She is reporting weight gain since a deep vein thrombosis. So far pulmonary embolism has never been confirmed. In fact the CT chest in October 2017 only shows groundglass opacities. Echocardiogram shows diastolic dysfunction. In addition after coming off her estrogen which she was taking for many years [in July 2017 after DVT] she is reporting significant hot flashes which are frustrating her. She continues to suffer from dyspnea and is frustrated by this as well.     OV 08/10/2016  Chief Complaint  Patient presents with  . Follow-up    Pt states overall her SOB has improved but has bad days every now and then. Pt c/o more SOB today  and occassional dry cough with some  frank blood - size of penny. Pt also states she has chest tightness when she is SOB - this resolves with rest.     Follow-up dyspnea: We finally determined that this dyspnea was unlikely due to pulmonary embolism. Was related to diastolic dysfunction and pulmonary groundglass infiltrates sustained in July 2017 at the time of deep vein thrombosis diagnosis. Last visit October 2017 I started Lasix therapy. Since then with the passage of time and Lasix therapy she has improved with her dyspnea. She still does have mild dyspnea on exertion. She has not attended pulmonary rehabilitation. She does not want to go to pulmonary habitation due to logistical issues and time issues. She and her husband said they will start exercising on a road bike for an exercise bike by themselves gradually.  In  terms of deep vein thrombosis related to estrogen intake.. She continues to be on anticoagulation since July 2017. She's completed 5 months of therapy. Interim d-dimer  normal. She has no bleeding complications. She wants to stop anticoagulation.  OV 10/25/2016  Chief Complaint  Patient presents with  . Follow-up    Pt here after HRCT. pt states her breathing is unchanged since last OV. Pt denies cough and CP/tightness.     Follow-up dyspnea: At last visit she told me that with Lasix therapy that her dyspnea improved. Today she tells me that she has now plateaued in terms of improvement. She still has dyspnea on exertion relieved by rest. The Lasix is helping but without the Lasix it is worse. It at the same time she feels she has not returned to her baseline pre-summer 2017. High-resolution CT scan of the chest done today and personally visualized shows the following abnormalities described suggestive of MAI infection  In terms of the left lower extremity DVT: In December 2017 we stopped anticoag based on normal D-dimer. She says since summer 2017 continues with daily left pain that sarts in ankle and spreads up to popliteal area along the venous course. On February 2018 on the 21st she had Doppler ultrasound of the left lower extremity that did not show any DVT. She also had chemistries that are normal. She takes Naprosyn once daily for this. She says all this is new since the DVT. She is frustrated by the pain. She says that primary care has deferred this issue to me also notices associated edema. She is wondering about vascular insufficiency and is requesting whether she should be referred to a vascular evaluation  Ct Chest High Resolution  Result Date: 10/25/2016 CLINICAL DATA:  Dyspnea and wheezing. Former smoker. History of pulmonary embolism and DVT. Follow-up abnormal chest CT. EXAM: CT CHEST WITHOUT CONTRAST TECHNIQUE: Multidetector CT imaging of the chest was performed following the standard  protocol without intravenous contrast. High resolution imaging of the lungs, as well as inspiratory and expiratory imaging, was performed. COMPARISON:  03/30/2016 chest CT angiogram. 07/08/2016 chest radiograph. FINDINGS: Cardiovascular: Normal heart size. No significant pericardial fluid/thickening. Left anterior descending coronary atherosclerosis. Atherosclerotic thoracic aorta with stable mild ectasia of the ascending thoracic aorta (maximum diameter 3.9 cm). Normal caliber pulmonary arteries. Mediastinum/Nodes: Status post left hemithyroidectomy. No discrete right thyroid lobe nodules. Unremarkable esophagus. No pathologically enlarged axillary, mediastinal or gross hilar lymph nodes, noting limited sensitivity for the detection of hilar adenopathy on this noncontrast study. Lungs/Pleura: No pneumothorax. No pleural effusion. There is mild-to-moderate cylindrical and varicoid bronchiectasis extensively distributed throughout both lungs, most prominent in the basilar right upper  lobe, right middle lobe and lingula. There is extensive patchy tree-in-bud and centrilobular nodularity throughout both lungs at the areas of bronchiectasis, which appears overall mildly worsened since 03/30/2016, including a new dominant nodular 13 x 11 mm opacity in the medial dependent right lower lobe (series 3/ image 76). Bandlike subpleural opacities at the areas of bronchiectasis in the basilar right upper lobe, medial segment right middle lobe and lingula are stable and compatible with postinfectious scars. Otherwise no acute consolidative airspace disease or lung masses. Mild centrilobular emphysema. Mild patchy air trapping in both lungs on the expiration sequence. No frank honeycombing. Upper abdomen: Partially visualized left parapelvic renal cysts. Musculoskeletal: No aggressive appearing focal osseous lesions. Partially visualized surgical hardware from ACDF in the lower cervical spine. Stable T9 vertebral hemangioma. Mild  thoracic spondylosis. IMPRESSION: 1. 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 Findings have mildly worsened in the interval since 03/30/2016, including a new dominant 13 mm nodular opacity in the right lower lobe. Recommend initial follow-up post treatment high-resolution chest CT in 6 months. 2. Mild patchy air trapping in both lungs, suggesting small airways disease. 3. Aortic atherosclerosis.  One vessel coronary atherosclerosis. Electronically Signed   By: Ilona Sorrel M.D.   On: 10/25/2016 10:07    OV 12/20/2016  Chief Complaint  Patient presents with  . Follow-up    Pt here after bronch. Pt states her breathing is unchanged since last OV. Pt c/o dry cough and chest heaviness. Pt denies Gina/c/s and CP/tightness.    Follow-up pulmonary infiltrates not otherwise specified: She underwent bronchoscopy with bronchoalveolar lavage of July 2018 which showed 57% monocytes and 26% neutrophils. Cultures are negative except for colonizing contaminant mold ; low colony count. Last CT scan was March 2018  Follow-up dyspnea: She continues to have dyspnea. She continues to insist that is all after the DVT. There are no new issues. She is planning to exercise [place]    03/20/2017 acute extended ov/Wert re: sob at rest and dry cough x one year  Chief Complaint  Patient presents with  . Acute Visit    Increased SOB, no energy, wheezing and non prod cough x 6 wks. Her cough is non prod, and esp worse at night, sometimes wakes her up.  No appetite.    after last ov with MR 12/20/16 (no change rx) Breathing was better for two weeks only if sitting but doe  x 50 ft  And mailbox  and stops 200 ft flat ever since dvt "PE"  Since then sob at rest unless sleeping p diazepam and "sleeping pill" and tramadol not aware of noct symptoms but after wakes each day  Always stops due to wob just walking to kitchen  Cough worse/ very harsh dry day > noct  PC rec prozac higher dose but no better so back to 10 mg daily  No better on saba hfa/ neb   No obvious patterns in day to day or daytime variability or assoc excess/ purulent sputum or mucus plugs or hemoptysis or cp or chest tightness,  or overt sinus or hb symptoms. No unusual exp hx or h/o childhood pna/ asthma or knowledge of premature birth.    OV 03/28/2017  Chief Complaint  Patient presents with  . Follow-up  Pt saw MW on 7.30.2018 for an acute visit. Pt states her SOB has not improved since last OV. Pt states she still has a dry cough. Pt denies CP/tightness, Gina/c/s, chest congestion.    Gina Lyons is had long-standing shortness of breath that remained somewhat unexplained. The onset was in summer 2017 at the time of admission for DVT. There is no clear-cut evidence of pulmonary embolism. She did have some groundglass opacities at that time. Initially diuresis helped but subsequently did not. Over time her CT scan of the chest to change to the extent that in spring 2018 dose addition she had MAI infection. I did a bronchoscopy in April 2018 and then saw her for follow-up in May 2018. At this point in time the cultures were negative but subsequently in June 2018 the cultures returned showing MAI infection. I had a phone conversation with her and we opted to follow because she was feeling better. Now she presents for follow-up since her visit in May 2018 she has had worsening shortness of breath. In the interim she even saw my colleague Dr. Legrand Como wert for worsening shortness of breath. Walking desaturation test 185 feet 3 laps on room air with a full head probe: d walk only one lap and got very short of breath. She was short  of breath for the remaining 510 minutes of a conversation in the room. She did not desaturate below 100% and she did not get tachycardic. She is also reporting severe associated significant fatigue. Dyspnea and symptoms are worse in the last few months. During household work is extremely tiring. She says the fatigue has been worked up extensively by her primary care physician without any obvious cause  Last d-dimer September 2017 was normal. normal. She's currently not on anticoagulation  Echocardiogram August 2017 with ejection fraction 55% with normal nuclear medicine cardiac stress test November 2017  Normal allergy tests July 2018  Chest x-ray 03/20/2017: Personally visualized. Chronic bronchitic changes. Radiology second running high resolution CT chest.  Results for Gina Lyons, Gina Lyons (MRN [id number]) as of 03/28/2017 09:26  Ref. Range 11/28/2016 15:35  ACID FAST SMEAR (AFB) Unknown Rpt  ACID FAST CULTURE WITH REFLEXED SENSITIVITIES Unknown Rpt (A)  Acid Fast Culture Unknown Positive (A)  Organism ID Unknown Comment (A)  MAC SUSCEPTIBILITY BROTH   Unknown Rpt (A)  AFB ORGANISM ID BY DNA PROBE Unknown Rpt (A)   IMPRESSION: 1. 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 Findings have mildly worsened in the interval since 03/30/2016, including a new dominant 13 mm nodular opacity in the right lower lobe. Recommend initial follow-up post treatment high-resolution chest CT in 6 months. 2. Mild patchy air trapping in both lungs, suggesting small airways disease. 3. Aortic atherosclerosis.  One vessel coronary atherosclerosis.   Electronically Signed   By: Ilona Sorrel M.D.   On:  10/25/2016 10:07   OV 05/11/2017  Chief Complaint  Patient presents with  . Follow-up    Pt states that she still has tingling and pain in her ankle from the blood clot. Still becomes SOB on exertion, but is doing better. Does have a lot of dry coughing. Denies any CP.   Follow-up dyspnea that is now deemed due to bronchiectasis associated with MAI infection. At last visit d-dimer was high we did a  CT angiogram ruled out pulmonary embolism but did show bronchiectasis associated with MAI infection. I referred her to infectious diseases Dr.: She is now on MAI treatment. This is causing significant nausea and fatigue. The trying different protocols and it seems to be helping. With the MAI treatment her dyspnea might be better but her cough is definitely not worse. In association she also has other nonspecific different cans of symptoms. She did complain that last night her left cough is a bit swollen but rate is better. This no warmth. She does not want any duplex ultrasound for this at this point. Review of the notes indicate that have not tried inhaler therapy for her in the past. Of note she is finished a flu shot for this season   OV 06/19/2017  Chief Complaint  Patient presents with  . Follow-up    Pt still complains of both legs hurting, if anything pain has gotten worse. Also has complaints of dry cough, SOB on exertion and occ. CP.    Follow-up dyspnea in the setting of MAI infection  She continues on MAI treatment. She is supposed to come back in the early part of 2019 for some reason she had an appointment scheduled today.She says that Spiriva that I gave last emesis sample helped her dyspnea but then it gave her the shakes.  Willing to try another inhaler for dyspnea from another class.her main issue today is that she continues to have chronic after and lower extremity pain on the left side. This is chronic. She needs to talk to primary care physician about this  Review of Systems      Objective:   Physical Exam  Constitutional: She is oriented to person, place, and time. She appears well-developed and well-nourished. No distress.  HENT:  Head: Normocephalic and atraumatic.  Right Ear: External ear normal.  Left Ear: External ear normal.  Mouth/Throat: Oropharynx is clear and moist. No oropharyngeal exudate.  Eyes: Pupils are equal, round, and reactive to light. Conjunctivae and EOM are normal. Right eye exhibits no discharge. Left eye exhibits no discharge. No scleral icterus.  Neck: Normal range of motion. Neck supple. No JVD present. No tracheal deviation present. No thyromegaly present.  Cardiovascular: Normal rate, regular rhythm, normal heart sounds and intact distal pulses.  Exam reveals no gallop and no friction rub.   No murmur heard. Pulmonary/Chest: Effort normal and breath sounds normal. No respiratory distress. She has no wheezes. She has no rales. She exhibits no tenderness.  Abdominal: Soft. Bowel sounds are normal. She exhibits no distension and no mass. There is no tenderness. There is no rebound and no guarding.  Musculoskeletal: Normal range of motion. She exhibits no edema or tenderness.  Lymphadenopathy:    She has no cervical adenopathy.  Neurological: She is alert and oriented to person, place, and time. She has normal reflexes. No cranial nerve deficit. She exhibits normal muscle tone. Coordination normal.  Skin: Skin is warm and dry. No rash noted. She is not diaphoretic. No erythema. No pallor.  Psychiatric: She has a normal mood and affect. Her behavior is normal. Judgment and thought content normal.  Vitals reviewed.  Vitals:   06/19/17 0855  BP: 120/70  Pulse: 71  SpO2: 97%  Weight: 127 lb (57.6 kg)  Height: _0  (1.575 m)    Estimated body mass index is 23.23 kg/m as calculated from the following:   Height as of this encounter: _1  (1.575 m).   Weight as of this encounter:  127 lb (57.6 kg).      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 Dyspnea on exertion R06.09        Plan:      Continue MAI treatment with Dr Linus Salmons - balancing benefit v side effect Too bad spiriva gave you "shakes"  Try sample pulmicort 28mg 2 puff twice daily and if helping call u[place]ain few weeks to get script Please talk to PCP KJani Gravel MD\ about leg pain  Followup 6 months or sooner if needed (cancel interim appointments)   Dr. MBrand Males M.D., FMetropolitan New Jersey LLC Dba Metropolitan Surgery CenterC.P Pulmonary and Critical Care Medicine Staff Physician CNorthportPulmonary and Critical Care Pager: [phone] If no answer or between  15:00h - 7:00h: call [phone]  06/19/2017 9:20 AM

## 2017-06-26 ENCOUNTER — Inpatient Hospital Stay: Admission: RE | Admit: 2017-06-26 | Payer: Medicare Other | Source: Ambulatory Visit

## 2017-07-11 ENCOUNTER — Encounter: Payer: Self-pay | Admitting: Internal Medicine

## 2017-07-11 ENCOUNTER — Ambulatory Visit: Payer: Medicare Other | Admitting: Internal Medicine

## 2017-07-11 VITALS — BP 121/79 | HR 78 | Temp 97.6°F | Wt 127.0 lb

## 2017-07-11 DIAGNOSIS — R5383 Other fatigue: Secondary | ICD-10-CM

## 2017-07-11 DIAGNOSIS — A310 Pulmonary mycobacterial infection: Secondary | ICD-10-CM | POA: Diagnosis not present

## 2017-07-11 DIAGNOSIS — R0609 Other forms of dyspnea: Secondary | ICD-10-CM | POA: Diagnosis not present

## 2017-07-11 MED ORDER — ETHAMBUTOL HCL 400 MG PO TABS: 15.0000 mg/kg | ORAL_TABLET | Freq: Every day | ORAL | 5 refills | Status: DC

## 2017-07-11 MED ORDER — RIFAMPIN 300 MG PO CAPS: 600.0000 mg | ORAL_CAPSULE | Freq: Every day | ORAL | 5 refills | Status: DC

## 2017-07-11 MED ORDER — AZITHROMYCIN 500 MG PO TABS: 500.0000 mg | ORAL_TABLET | Freq: Every day | ORAL | 5 refills | Status: DC

## 2017-07-11 NOTE — Assessment & Plan Note (Signed)
She will convert back to daily 3 drrug regimen and I will start the PA for inhaled amikacin.  Once approved she will add that in.  Will check a sputum now as well.

## 2017-07-11 NOTE — Assessment & Plan Note (Signed)
Likely a combination of the disease and the medications.  I encouraged her to continue activity as tolerated

## 2017-07-11 NOTE — Assessment & Plan Note (Signed)
Much of this is related to the bronchiectasis.  Did not tolreate inhalers.

## 2017-07-11 NOTE — Progress Notes (Signed)
   Subjective:    Patient ID: Gina Lyons, female    DOB: July 11, 1946, 71 y.o.   MRN: [id number]  HPI Here for follow up of MAI bronchiectasis.   She continues to feel worse with more cough, sob particularly related to cough.  No fever or chills.  Does not like the ethambutol.  Concerned about affecting her kidneys.  No weight loss.  Overall still fatigued.  No associated n/v.     Review of Systems  Eyes: Negative for photophobia and visual disturbance.  Gastrointestinal: Negative for abdominal pain and nausea.  Skin: Negative for rash.  Neurological: Negative for dizziness.       Objective:   Physical Exam  Constitutional: She appears well-developed and well-nourished. No distress.  Eyes: No scleral icterus.  Cardiovascular: Normal rate, regular rhythm and normal heart sounds.  No murmur heard. Pulmonary/Chest:  Distant sounding  Skin: No rash noted.   SH: remote smoking history       Assessment & Plan:

## 2017-07-12 ENCOUNTER — Ambulatory Visit (INDEPENDENT_AMBULATORY_CARE_PROVIDER_SITE_OTHER): Payer: Medicare Other | Admitting: Pharmacist Clinician (PhC)/ Clinical Pharmacy Specialist

## 2017-07-12 ENCOUNTER — Other Ambulatory Visit: Payer: Medicare Other

## 2017-07-12 DIAGNOSIS — A310 Pulmonary mycobacterial infection: Secondary | ICD-10-CM

## 2017-07-12 NOTE — Progress Notes (Signed)
HPI: Gina Lyons is a 71 y.o. female who is here to see pharmacy to sign paper work for her med application.   Allergies: Allergies  Allergen Reactions  . Imitrex [Sumatriptan] Other (See Comments)    syncope  . Latex     Blisters from prolonged exposure to latex  . Celebrex [Celecoxib] Other (See Comments)    palpations   . Codeine Itching  . Percocet [Oxycodone-Acetaminophen] Itching    Vitals:    Past Medical History: Past Medical History:  Diagnosis Date  . Anxiety    "just w/this breathing problem" (03/30/2016)  . Arthritis    "back" (03/30/2016)  . Chronic lower back pain   . Diastolic dysfunction    a. 03/2016 Echo: EF 50-55%, no rwma, GR1 DD, mild AI, triv MR/TR, PASP 68mHg.  .Mar[place] KitchenDVT (deep venous thrombosis) ([facility] [place]    a. 02/2016 LLE DVT-->Xarelto, switched to Eliquis 03/2016 b/c pt felt that xarelto may have been causing shortness of breath.  . [place] ([place]) [place]   . Dyspnea on exertion    a. 03/08/2016, 03/19/2016, 03/30/2016 CTA Chest: No PE;  b. 03/2016 V:Q Scan: Intermediate probability for PE;  c. 05/2016 Pulmonary Stress Test: Excellent effort, nl VO2 max and anaerobic threshold, flattening of O2 pulse curve associated with tachypnea and dead space ventilation and approaching ventilatory limits. No desat. Likely combined cardiac and pulmonary limitations.  . Family history of adverse reaction to anesthesia    "mom had hard time to wake up"  . History of blood transfusion 1964   "I hemorrhaged after my baby was born"  . Hypertension   . Hypothyroidism   . Migraine    "under control w/RX" (03/30/2016)  . Pneumonia 02/2016   "didn't know I had it" (03/30/2016)  . 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 Marital status: Married    Spouse name: Not on file  . Number of children: 3  . Years of education: Not on file  . Highest education level: Not on file  Social Needs  .  Financial resource strain: Not on file  . Food insecurity - worry: Not on file  . Food insecurity - inability: Not on file  . Transportation needs - medical: Not on file  . Transportation needs - non-medical: Not on file  Occupational History  . Not on file  Tobacco Use  . Smoking status: Former Smoker    Packs/day: 1.00    Years: 3.00    Pack years: 3.00    Types: Cigarettes    Last attempt to quit: 08/23/1975    Years since quitting: 41.9  . Smokeless tobacco: Never Used  Substance and Sexual Activity  . Alcohol use: No  . Drug use: No  . Sexual activity: Yes  Other Topics Concern  . Not on file  Social History Narrative   retired    Previous Regimen:   Current Regimen: Ethambutol/Rifampin/Azithromycin  Labs: No results found for: HIV1RNAQUANT, HIV1RNAVL, CD4TABS, HEPBSAB, HEPBSAG, HCVAB  CrCl: CrCl cannot be calculated (Patient's most recent lab result is older than the maximum 21 days allowed.).  Lipids: No results found for: CHOL, TRIG, HDL, CHOLHDL, VLDL, LDLCALC  Assessment: Jazlen saw Dr. CLinus Salmonsyesterday for her MAC management. She has been on the current therapy listed above for this. It looks like her symptoms have gotten worse. She was on TIW regimen but due to tolerability, Dr. CLinus Salmons  will convert her back to the daily regimen. We will attempt to add-on inhaled liposomal amikacin. Therefore his here today to sign some paper work so we can start the process for it. It'll like take weeks to get it approved. It'll have to filled at a specialty pharmacy. They will train her on the nebulizer. She will likely need at least at year of therapy.  She brought back the sputum sample today for culture.   Recommendations:  Apply for [place], PharmD, BCPS, AAHIVP, CPP Clinical Infectious Disease [place] for Infectious Disease 07/12/2017, 3:25 PM

## 2017-07-17 ENCOUNTER — Telehealth: Payer: Self-pay | Admitting: Pharmacist Clinician (PhC)/ Clinical Pharmacy Specialist

## 2017-07-17 NOTE — Telephone Encounter (Signed)
Gina Lyons had some questions for [place] about her meds. Left a VM to call back.

## 2017-07-25 ENCOUNTER — Encounter: Payer: Self-pay | Admitting: Internal Medicine

## 2017-07-25 ENCOUNTER — Ambulatory Visit: Payer: Medicare Other | Admitting: Internal Medicine

## 2017-07-25 DIAGNOSIS — H9319 Tinnitus, unspecified ear: Secondary | ICD-10-CM | POA: Insufficient documentation

## 2017-07-25 DIAGNOSIS — A310 Pulmonary mycobacterial infection: Secondary | ICD-10-CM | POA: Diagnosis not present

## 2017-07-25 DIAGNOSIS — R5383 Other fatigue: Secondary | ICD-10-CM

## 2017-07-25 DIAGNOSIS — H9311 Tinnitus, right ear: Secondary | ICD-10-CM

## 2017-07-25 MED ORDER — ETHAMBUTOL HCL 400 MG PO TABS: 15.0000 mg/kg | ORAL_TABLET | Freq: Every day | ORAL | 5 refills | Status: DC

## 2017-07-25 MED ORDER — AZITHROMYCIN 500 MG PO TABS: 500.0000 mg | ORAL_TABLET | Freq: Every day | ORAL | 5 refills | Status: DC

## 2017-07-25 MED ORDER — RIFAMPIN 300 MG PO CAPS: 600.0000 mg | ORAL_CAPSULE | Freq: Every day | ORAL | 5 refills | Status: DC

## 2017-07-25 NOTE — Assessment & Plan Note (Signed)
I have continued to encourage her to take the medicaitons and will use amikacin once approved on top.  Will take daily.   rtc 8 weeks

## 2017-07-25 NOTE — Progress Notes (Signed)
   Subjective:    Patient ID: Gina Lyons, female    DOB: 1946/08/01, 71 y.o.   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   She is feeling better in relation to the cough and sob but feels more tired she feels due to the medication.  No fever or chills.  Has not started inhaled amikacin yet, waiting for approval.  Questions about side effects. Having some tinnitus   Review of Systems  Eyes: Negative for photophobia and visual disturbance.  Gastrointestinal: Negative for abdominal pain and nausea.  Skin: Negative for rash.  Neurological: Negative for dizziness.       Objective:   Physical Exam  Constitutional: She appears well-developed and well-nourished. No distress.  Eyes: No scleral icterus.  Cardiovascular: Normal rate, regular rhythm and normal heart sounds.  No murmur heard. Pulmonary/Chest:  Distant sounding  Skin: No rash noted.   SH: remote smoking history       Assessment & Plan:

## 2017-07-25 NOTE — Assessment & Plan Note (Signed)
Will send to audiology both for the symptom and check of her hearing prior to starting amikacin inhaled

## 2017-07-25 NOTE — Assessment & Plan Note (Signed)
A lot of this is medication and disease related.  Encouraged to continue activity as much as tolerated

## 2017-08-03 ENCOUNTER — Ambulatory Visit (INDEPENDENT_AMBULATORY_CARE_PROVIDER_SITE_OTHER)
Admission: RE | Admit: 2017-08-03 | Discharge: 2017-08-03 | Disposition: A | Discharge status: Home / Self Care | Payer: Medicare Other | Source: Ambulatory Visit | Attending: Internal Medicine | Admitting: Internal Medicine

## 2017-08-03 ENCOUNTER — Ambulatory Visit: Payer: Medicare Other | Admitting: Internal Medicine

## 2017-08-03 ENCOUNTER — Other Ambulatory Visit (INDEPENDENT_AMBULATORY_CARE_PROVIDER_SITE_OTHER): Payer: Medicare Other

## 2017-08-03 ENCOUNTER — Telehealth: Payer: Self-pay | Admitting: Internal Medicine

## 2017-08-03 ENCOUNTER — Encounter: Payer: Self-pay | Admitting: Internal Medicine

## 2017-08-03 VITALS — BP 126/74 | HR 75 | Ht 62.0 in | Wt 126.0 lb

## 2017-08-03 DIAGNOSIS — A310 Pulmonary mycobacterial infection: Secondary | ICD-10-CM | POA: Diagnosis not present

## 2017-08-03 DIAGNOSIS — Z5181 Encounter for therapeutic drug level monitoring: Secondary | ICD-10-CM | POA: Diagnosis not present

## 2017-08-03 DIAGNOSIS — R5382 Chronic fatigue, unspecified: Secondary | ICD-10-CM | POA: Diagnosis not present

## 2017-08-03 DIAGNOSIS — R110 Nausea: Secondary | ICD-10-CM | POA: Diagnosis not present

## 2017-08-03 LAB — CBC WITH DIFFERENTIAL/PLATELET
BASOS ABS: 0.1 10*3/uL (ref 0.0–0.1)
Basophils Relative: 1.3 % (ref 0.0–3.0)
Eosinophils Absolute: 0.2 10*3/uL (ref 0.0–0.7)
Eosinophils Relative: 3.2 % (ref 0.0–5.0)
HEMATOCRIT: 40.8 % (ref 36.0–46.0)
HEMOGLOBIN: 13.5 g/dL (ref 12.0–15.0)
LYMPHS PCT: 31.7 % (ref 12.0–46.0)
Lymphs Abs: 1.7 10*3/uL (ref 0.7–4.0)
MCHC: 33.1 g/dL (ref 30.0–36.0)
MCV: 96.7 fl (ref 78.0–100.0)
MONOS PCT: 8.7 % (ref 3.0–12.0)
Monocytes Absolute: 0.5 10*3/uL (ref 0.1–1.0)
NEUTROS ABS: 2.9 10*3/uL (ref 1.4–7.7)
Neutrophils Relative %: 55.1 % (ref 43.0–77.0)
Platelets: 338 10*3/uL (ref 150.0–400.0)
RBC: 4.22 Mil/uL (ref 3.87–5.11)
RDW: 13.1 % (ref 11.5–15.5)
WBC: 5.3 10*3/uL (ref 4.0–10.5)

## 2017-08-03 LAB — HEPATIC FUNCTION PANEL
ALBUMIN: 4.3 g/dL (ref 3.5–5.2)
ALT: 11 U/L (ref 0–35)
AST: 16 U/L (ref 0–37)
Alkaline Phosphatase: 86 U/L (ref 39–117)
Bilirubin, Direct: 0.2 mg/dL (ref 0.0–0.3)
Total Bilirubin: 0.5 mg/dL (ref 0.2–1.2)
Total Protein: 6.9 g/dL (ref 6.0–8.3)

## 2017-08-03 LAB — BASIC METABOLIC PANEL
BUN: 18 mg/dL (ref 6–23)
CALCIUM: 8.7 mg/dL (ref 8.4–10.5)
CHLORIDE: 107 meq/L (ref 96–112)
CO2: 29 meq/L (ref 19–32)
Creatinine, Ser: 0.8 mg/dL (ref 0.40–1.20)
GFR: 74.55 mL/min (ref >60.00)
Glucose, Bld: 79 mg/dL (ref 70–99)
Potassium: 3.9 mEq/L (ref 3.5–5.1)
SODIUM: 142 meq/L (ref 135–145)

## 2017-08-03 NOTE — Telephone Encounter (Signed)
  cxr - stable  Labs - normal  Plan - she can give holiday for her 3 MAI drugs x  2 weeks - if she wants to see Dr Lorenda Cahill at [facility] we can make referral   Dr. Brand Males, M.D., [facility].C.P Pulmonary and Critical Care Medicine Staff Physician, [place] Director - Interstitial Lung Disease  Program  Pulmonary [place] [facility] [place], [place], [address]  Pager: [phone], If no answer or between  15:00h - 7:00h: call [phone] Telephone: [phone]     PULMONARY No results for input(s): PHART, PCO2ART, PO2ART, HCO3, TCO2, O2SAT in the last 168 hours.  Invalid input(s): PCO2, PO2  CBC Recent Labs  Lab 08/03/17 1218  HGB 13.5  HCT 40.8  WBC 5.3  PLT 338.0    COAGULATION No results for input(s): INR in the last 168 hours.  CARDIAC  No results for input(s): TROPONINI in the last 168 hours. No results for input(s): PROBNP in the last 168 hours.   CHEMISTRY Recent Labs  Lab 08/03/17 1218  NA 142  K 3.9  CL 107  CO2 29  GLUCOSE 79  BUN 18  CREATININE 0.80  CALCIUM 8.7   Estimated Creatinine Clearance: 49.5 mL/min (by C-G formula based on SCr of 0.8 mg/dL).   LIVER Recent Labs  Lab 08/03/17 1218  AST 16  ALT 11  ALKPHOS 86  BILITOT 0.5  PROT 6.9  ALBUMIN 4.3     INFECTIOUS No results for input(s): LATICACIDVEN, PROCALCITON in the last 168 hours.   ENDOCRINE CBG (last 3)  No results for input(s): GLUCAP in the last 72 hours.       IMAGING x48h  - image(s) personally visualized  -   highlighted in bold Dg Chest 2 View  Result Date: 08/03/2017 CLINICAL DATA:  History of mycobacterium avium involvement of the lungs EXAM: CHEST  2 VIEW COMPARISON:  CT chest of 04/25/2017, 03/28/2017, and chest x-ray of 04/25/2017 FINDINGS: No new parenchymal infiltrate or pleural effusion is seen. Linear scarring in the lingula is again noted. Mediastinal and hilar contours are  unremarkable. The heart is within normal limits in size. No acute bony abnormality is seen. IMPRESSION: Linear scarring in the lingula.  No definite active process. Electronically Signed   By: Ivar Drape M.D.   On: 08/03/2017 13:52

## 2017-08-03 NOTE — Patient Instructions (Addendum)
ICD-10-CM   1. 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 Encounter for therapeutic drug monitoring Z51.81   3. Chronic fatigue R53.82   4. Nausea R11.0     Support holding off start of inhaled amikacin - after discussing your goals with Dr Linus Salmons of ID and you Support 2 week drug holiday of ethambutol, rifampin, and azithromycin at your request - have updated DR Comer Check CXR 2 view at your request Check Cbc, bmet, lft test 08/03/2017 at your request Await MAI suputum test from 07/11/17 - negative for growth as of 08/03/2017 but need 2-3 more weeks for final  Followup - will call with results - recommend that further management of the ID drugs and discussion be with infectious diseases Dr Linus Salmons or consider 2nd opinion with DR Brigitte Pulse at [facility]- we can decide 2nd opinion after blood work   - 3 months or sooner with Dr Chase Caller

## 2017-08-03 NOTE — Progress Notes (Signed)
Subjective:     Patient ID: Gina Lyons, female   DOB: May 19, 1946, 71 y.o.   MRN: [id number]  HPI   71 year old female former smoker, only 3 pack year history, and for pulmonary consult July 2017 for DVT (No PE on CT chest x 3 ) treated with Xarelto.   TEST Gina Lyons.  Venous Doppler, 03/04/2016 showed DVT in the short segment of left peroneal vein CTA chest , 7/18 2017 negative for pulmonary embolism, bibasilar sub-solid pulmonary nodules measuring up to 7 mm CTA chest 03/19/2016 negative for pulmonary embolism, stable dilation of the ascending aorta CTA chest 03/30/2016 negative for pulmonary emboli, bilateral ground glass opacities, nodular opacities within the lingula, right middle lobe and right lower lobe VQ scan 03/31/16 bilateral patchy matched ventilation/perfusion defects, intermediate  probability for pulmonary embolus. 2D echo 03/31/16>EF 50-55%, gr 1 DD , PAP 92mHg Pulmonary function test on 03/31/2016 showed a FEV1 at 107%, ratio 77, FVC of 105%, no significant bronchodilator response, DLCO 63%  04/12/2016 post hospital follow-up Patient presents for a post hospital follow-up. It was diagnosed with a DVT on the left 03/04/2016. She was started on Xarelto. She was on Estradiol and she stopped this. CT chest on July 18 was negative for PE. Patient had 3 weeks of progressive shortness of breath She was admitted on 03/30/2016. Patient was admitted August 9 through 04/01/2016 for progressive shortness of breath. CT chest was done that was negative for pulmonary embolism. It did show bilateral groundglass opacities  and nodular opacities within the lingula, right middle lobe and right lower lobe. Was transitioned from [place] Eliquis and hospitalization. Patient did undergo gentle diuresis. She was also treated with Levaquin for questionable atypical infection. A VQ scan was done that showed bilateral patchy matched ventilation and perfusion defects with intermittent probability for  pulmonary embolism. She says she was told she had a PE.from this .   A 2-D echo showed a normal EF at 50-55% with grade 1 diastolic dysfunctions and pulmonary artery pressures at 25 m of mercury. Pulmonary function test showed normal lung function with an FEV1 at 107%. Patient did have a diffusing defect =DLCO 63%.  Autoimmune and CTD workup was neg (HIV, ANCA, GBM, CCP , dsDNA ab , ANA )  Discharge. Patient is feeling some better but still gets winded with walking. No dyspnea at rest .  She denies cough, hemoptysis , abd pain, n/v./d. She does have some soreness along left LE.  Says prior to admit she was very active with yard work , cycling and house work. No dyspnea at all.  Prior to admit no long travel, injury , surgery . No hx of DVT/PE . No FH of DVT/PE.  No hx of cancer.     OV 05/13/2016  Chief Complaint  Patient presents with  . Follow-up    Pt here after CXR and seeing TP on 8.22.17. Pt states her SOB has not improved since also OV. PT c/o DOE, intermittent dry cough, chest tightness when SOB and resolves when she lays down. Pt states she had mild left foot swelling on 9.21.17, this has resolved. Pt denies f/c/s.     [name] old female with very limited remote smoking. In July 2017 she was admitted with left-sided DVT and was started on Xarelto. Sometime after that according to her history she started developing shortness of breath which has persisted to this day despite changes with Xarelto to es.E;liquis. This dyspnea is pervasive. It is moderate to severe  in intensity. It is present for exertion and relieved by rest. Class II-III levels of activity makes her dyspneic. There is no orthopnea paroxysmal nocturnal dyspnea. She had 3 CT scans of the chest in July 2017 that ruled out pulmonary embolism but in August 2017 she did have some new groundglass opacities. August 2017 VQ scan was of intermediate probability. Only function does show reduction in diffusion capacity  suggesting the groundglass opacities might be real. She did have autoimmune workup in August 2017 along with complement and vascular this workup that was normal. She presents today with her husband. She is really frustrated by her dyspnea. Chest x-ray personally visualized shows some nonspecific scarring. She is not able to do her household activities.  she has a dry cough of mild intensity for the last 6 months. Cough preceded onset of dyspnea and DVT. At last visit they specifically denied this cough. This no associated wheezing or orthopnea paroxysmal nocturnal dyspnea or chills.    Exhaled nitric oxide testing today in the office 05/13/2016: 23 ppb and normal   Walking desaturation test on 05/13/2016 185 feet x 3 laps:   did NOT desaturate. Rest pulse ox was 100%, final pulse ox was 100% was the final pulse ox w heart rate response was at rest78. HR response  to 94/min at peak exertion.    Noted SCL 70 not done (no dysphagia)  D dimer this visti 0.36 and n  ormal  Dg Chest 2 View  Result Date: 05/13/2016 CLINICAL DATA:  Follow-up pulmonary infiltrates. EXAM: CHEST  2 VIEW COMPARISON:  04/07/2016 and 03/31/2016 FINDINGS: Mild hyperinflation is stable. Stable linear densities in the anterior left lung. No significant airspace disease or consolidation. Heart and mediastinum are within normal limits and stable. Surgical plate in the lower cervical spine. Trachea is midline. No large pleural effusions. Old left seventh rib fracture. IMPRESSION: Stable linear densities in the left lung are probably related to scarring. No acute cardiopulmonary disease. Electronically Signed   By: Markus Daft M.D.   On: 05/13/2016 14:25       OV 06/01/2016  Chief Complaint  Patient presents with  . Follow-up    Pt here after CPST. Pt states her SOB has slightly improved since last OV. Pt c/o dry cough and occassional chest tightness - under breasts after eating dinner in evening. Pt denies chest congestion  and f/c/s.     Dyspnea evaluation. Here for review of pulmonary stress test. She underwent pulmonary stress test 05/26/2016. She gave an excellent effort. She had normal VO2 max and anaerobic threshold. Nevertheless there is a flattening of her O2 pulse curve that is significant. This is associated with tachypnea and dead space ventilation and approaching ventilatory limits. Pulse ox did not go down. In summary this is a combinati    She is reporting weight gain since a deep vein thrombosis. So far pulmonary embolism has never been confirmed. In fact the CT chest in October 2017 only shows groundglass opacities. Echocardiogram shows diastolic dysfunction. In addition after coming off her estrogen which she was taking for many years [in July 2017 after DVT] she is reporting significant hot flashes which are frustrating her. She continues to suffer from dyspnea and is frustrated by this as well.     OV 08/10/2016  Chief Complaint  Patient presents with  . Follow-up    Pt states overall her SOB has improved but has bad days every now and then. Pt c/o more SOB today and occassional dry  cough with some  frank blood - size of penny. Pt also states she has chest tightness when she is SOB - this resolves with rest.     Follow-up dyspnea: We finally determined that this dyspnea was unlikely due to pulmonary embolism. Was related to diastolic dysfunction and pulmonary groundglass infiltrates sustained in July 2017 at the time of deep vein thrombosis diagnosis. Last visit October 2017 I started Lasix therapy. Since then with the passage of time and Lasix therapy she has improved with her dyspnea. She still does have mild dyspnea on exertion. She has not attended pulmonary rehabilitation. She does not want to go to pulmonary habitation due to logistical issues and time issues. She and her husband said they will start exercising on a road bike for an exercise bike by themselves gradually.  In terms of deep  vein thrombosis related to estrogen intake.. She continues to be on anticoagulation since July 2017. She's completed 5 months of therapy. Interim d-dimer  normal. She has no bleeding complications. She wants to stop anticoagulation.  OV 10/25/2016  Chief Complaint  Patient presents with  . Follow-up    Pt here after HRCT. pt states her breathing is unchanged since last OV. Pt denies cough and CP/tightness.     Follow-up dyspnea: At last visit she told me that with Lasix therapy that her dyspnea improved. Today she tells me that she has now plateaued in terms of improvement. She still has dyspnea on exertion relieved by rest. The Lasix is helping but without the Lasix it is worse. It at the same time she feels she has not returned to her baseline pre-summer 2017. High-resolution CT scan of the chest done today and personally visualized shows the following abnormalities described suggestive of MAI infection  In terms of the left lower extremity DVT: In December 2017 we stopped anticoag based on normal D-dimer. She says since summer 2017 continues with daily left pain that sarts in ankle and spreads up to popliteal area along the venous course. On February 2018 on the 21st she had Doppler ultrasound of the left lower extremity that did not show any DVT. She also had chemistries that are normal. She takes Naprosyn once daily for this. She says all this is new since the DVT. She is frustrated by the pain. She says that primary care has deferred this issue to me also notices associated edema. She is wondering about vascular insufficiency and is requesting whether she should be referred to a vascular evaluation  Ct Chest High Resolution  Result Date: 10/25/2016 CLINICAL DATA:  Dyspnea and wheezing. Former smoker. History of pulmonary embolism and DVT. Follow-up abnormal chest CT. EXAM: CT CHEST WITHOUT CONTRAST TECHNIQUE: Multidetector CT imaging of the chest was performed following the standard protocol  without intravenous contrast. High resolution imaging of the lungs, as well as inspiratory and expiratory imaging, was performed. COMPARISON:  03/30/2016 chest CT angiogram. 07/08/2016 chest radiograph. FINDINGS: Cardiovascular: Normal heart size. No significant pericardial fluid/thickening. Left anterior descending coronary atherosclerosis. Atherosclerotic thoracic aorta with stable mild ectasia of the ascending thoracic aorta (maximum diameter 3.9 cm). Normal caliber pulmonary arteries. Mediastinum/Nodes: Status post left hemithyroidectomy. No discrete right thyroid lobe nodules. Unremarkable esophagus. No pathologically enlarged axillary, mediastinal or gross hilar lymph nodes, noting limited sensitivity for the detection of hilar adenopathy on this noncontrast study. Lungs/Pleura: No pneumothorax. No pleural effusion. There is mild-to-moderate cylindrical and varicoid bronchiectasis extensively distributed throughout both lungs, most prominent in the basilar right upper lobe, right middle  lobe and lingula. There is extensive patchy tree-in-bud and centrilobular nodularity throughout both lungs at the areas of bronchiectasis, which appears overall mildly worsened since 03/30/2016, including a new dominant nodular 13 x 11 mm opacity in the medial dependent right lower lobe (series 3/ image 76). Bandlike subpleural opacities at the areas of bronchiectasis in the basilar right upper lobe, medial segment right middle lobe and lingula are stable and compatible with postinfectious scars. Otherwise no acute consolidative airspace disease or lung masses. Mild centrilobular emphysema. Mild patchy air trapping in both lungs on the expiration sequence. No frank honeycombing. Upper abdomen: Partially visualized left parapelvic renal cysts. Musculoskeletal: No aggressive appearing focal osseous lesions. Partially visualized surgical hardware from ACDF in the lower cervical spine. Stable T9 vertebral hemangioma. Mild thoracic  spondylosis. IMPRESSION: 1. 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 Findings have mildly worsened in the interval since 03/30/2016, including a new dominant 13 mm nodular opacity in the right lower lobe. Recommend initial follow-up post treatment high-resolution chest CT in 6 months. 2. Mild patchy air trapping in both lungs, suggesting small airways disease. 3. Aortic atherosclerosis.  One vessel coronary atherosclerosis. Electronically Signed   By: Ilona Sorrel M.D.   On: 10/25/2016 10:07    OV 12/20/2016  Chief Complaint  Patient presents with  . Follow-up    Pt here after bronch. Pt states her breathing is unchanged since last OV. Pt c/o dry cough and chest heaviness. Pt denies f/c/s and CP/tightness.    Follow-up pulmonary infiltrates not otherwise specified: She underwent bronchoscopy with bronchoalveolar lavage of July 2018 which showed 57% monocytes and 26% neutrophils. Cultures are negative except for colonizing contaminant mold ; low colony count. Last CT scan was March 2018  Follow-up dyspnea: She continues to have dyspnea. She continues to insist that is all after the DVT. There are no new issues. She is planning [place] exercise [place]    03/20/2017 acute extended ov/Wert re: sob at rest and dry cough x one year  Chief Complaint  Patient presents with  . Acute Visit    Increased SOB, no energy, wheezing and non prod cough x 6 wks. Her cough is non prod, and esp worse at night, sometimes wakes her up.  No appetite.    after last ov with MR 12/20/16 (no change rx) Breathing was better for two weeks only if sitting but doe  x 50 ft  And mailbox and stops  200 ft flat ever since dvt "PE"  Since then sob at rest unless sleeping p diazepam and "sleeping pill" and tramadol not aware of noct symptoms but after wakes each day  Always stops due to wob just walking to kitchen  Cough worse/ very harsh dry day > noct  PC rec prozac higher dose but no better so back to 10 mg daily  No better on saba hfa/ neb   No obvious patterns in day to day or daytime variability or assoc excess/ purulent sputum or mucus plugs or hemoptysis or cp or chest tightness,  or overt sinus or hb symptoms. No unusual exp hx or h/o childhood pna/ asthma or knowledge of premature birth.    OV 03/28/2017  Chief Complaint  Patient presents with  . Follow-up    Pt  saw MW on 7.30.2018 for an acute visit. Pt states her SOB has not improved since last OV. Pt states she still has a dry cough. Pt denies CP/tightness, f/c/s, chest congestion.    Tiffny Gemmer is had long-standing shortness of breath that remained somewhat unexplained. The onset was in summer 2017 at the time of admission for DVT. There is no clear-cut evidence of pulmonary embolism. She did have some groundglass opacities at that time. Initially diuresis helped but subsequently did not. Over time her CT scan of the chest to change to the extent that in spring 2018 dose addition she had MAI infection. I did a bronchoscopy in April 2018 and then saw her for follow-up in May 2018. At this point in time the cultures were negative but subsequently in June 2018 the cultures returned showing MAI infection. I had a phone conversation with her and we opted to follow because she was feeling better. Now she presents for follow-up since her visit in May 2018 she has had worsening shortness of breath. In the interim she even saw my colleague Dr. Legrand Como wert for worsening shortness of breath. Walking desaturation test 185 feet 3 laps on room air with a full head probe: d walk only one lap and got very short of breath. She was short of breath  for the remaining 510 minutes of a conversation in the room. She did not desaturate below 100% and she did not get tachycardic. She is also reporting severe associated significant fatigue. Dyspnea and symptoms are worse in the last few months. During household work is extremely tiring. She says the fatigue has been worked up extensively by her primary care physician without any obvious cause  Last d-dimer September 2017 was normal. normal. She's currently not on anticoagulation  Echocardiogram August 2017 with ejection fraction 55% with normal nuclear medicine cardiac stress test November 2017  Normal allergy tests July 2018  Chest x-ray 03/20/2017: Personally visualized. Chronic bronchitic changes. Radiology second running high resolution CT chest.  Results for CAILIN, GEBEL (MRN [id number]) as of 03/28/2017 09:26  Ref. Range 11/28/2016 15:35  ACID FAST SMEAR (AFB) Unknown Rpt  ACID FAST CULTURE WITH REFLEXED SENSITIVITIES Unknown Rpt (A)  Acid Fast Culture Unknown Positive (A)  Organism ID Unknown Comment (A)  MAC SUSCEPTIBILITY BROTH   Unknown Rpt (A)  AFB ORGANISM ID BY DNA PROBE Unknown Rpt (A)   IMPRESSION: 1. 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 Findings have mildly worsened in the interval since 03/30/2016, including a new dominant 13 mm nodular opacity in the right lower lobe. Recommend initial follow-up post treatment high-resolution chest CT in 6 months. 2. Mild patchy air trapping in both lungs, suggesting small airways disease. 3. Aortic atherosclerosis.  One vessel coronary atherosclerosis.   Electronically Signed   By: Ilona Sorrel M.D.   On: 10/25/2016  10:07   OV 05/11/2017  Chief Complaint  Patient presents with  . Follow-up    Pt states that she still has tingling and pain in her ankle from the blood clot. Still becomes SOB on exertion, but is doing better. Does have a lot of dry coughing. Denies any CP.   Follow-up dyspnea that is now deemed due to bronchiectasis associated with MAI infection. At last visit d-dimer was high we did a CT  angiogram ruled out pulmonary embolism but did show bronchiectasis associated with MAI infection. I referred her to infectious diseases Dr.: She is now on MAI treatment. This is causing significant nausea and fatigue. The trying different protocols and it seems to be helping. With the MAI treatment her dyspnea might be better but her cough is definitely not worse. In association she also has other nonspecific different cans of symptoms. She did complain that last night her left cough is a bit swollen but rate is better. This no warmth. She does not want any duplex ultrasound for this at this point. Review of the notes indicate that have not tried inhaler therapy for her in the past. Of note she is finished a flu shot for this season   OV 06/19/2017  Chief Complaint  Patient presents with  . Follow-up    Pt still complains of both legs hurting, if anything pain has gotten worse. Also has complaints of dry cough, SOB on exertion and occ. CP.    Follow-up dyspnea in the setting of MAI infection  She continues on MAI treatment. She is supposed to come back in the early part of 2019 for some reason she had an appointment scheduled today.She says that Spiriva that I gave last emesis sample helped her dyspnea but then it gave her the shakes.  Willing to try another inhaler for dyspnea from another class.her main issue today is that she continues to have chron lower extremity pain on the left side. This is chronic. She needs to talk to primary care physician about this   OV 08/03/2017  Chief Complaint  Patient  presents with  . Follow-up    Pt stated that she has a lot of questions for MR and wanted an appt. Pt has been about the same since last visit. Has been having a lot of side effects from some meds she has been put on.    71 year old female for dyspnea in the setting of MI infection  I just saw her June 19, 2017.  This visit is an acute visit.  She made this visit specifically because of concerns of drug intolerance with her MAI treatment.  She is here with her husband.  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  She is on triple drug regimen of ethambutol rifampin and azithromycin.  This gives a significant nausea and fatigue.  They did see infectious diseases Dr. Linus Salmons on July 11, 2017.  Repeat sputum culture was done and this did not show any acid-fast bacilli.  Culture is still pending although no growth so far.  Conversation was held with the patient and patient agreed to start inhaled tobramycin.  She does have a history of tinnitus which was noted by infectious diseases and audiology appointment has been set up but patient is now concerned about all the significant side effects of inhaled tobramycin that include ototoxicity.  She is frustrated about adding a fourth medication to a pre-existing regimen.  She says she does not understand the rationale.  She is frustrated that all aspects of her treatment of benefits, risks and implications of not been communicated with her although the documentation is otherwise.  She is asking for blood work and chest x-ray and guidance from me about decision making with her treatment.  I did speak with infectious diseases Dr. Linus Salmons who is supportive of goal-directed therapy.  Patient is a whether the disease has potential to progress without Mycobacterium avium complex treatment.  On the other  hand she has been explained to her that the fatigue and side effects of treatment can sometimes be overwhelming and sometimes  patients do opt for supportive care.  She had a husband wondering about a 2-week drug holiday of a 3 baseline drugs.  At this point she is not inclined to start inhaled tobramycin at all.  There are no other issues.    has a past medical history of Anxiety, Arthritis, Chronic lower back pain, Diastolic dysfunction, DVT (deep venous thrombosis) ([place]), DVT (deep venous thrombosis) ([place]), Dyspnea on exertion, Family history of adverse reaction to anesthesia, History of blood transfusion (1964), Hypertension, Hypothyroidism, Migraine, Pneumonia (02/2016), and Tumor, thyroid.   reports that she quit smoking about 41 years ago. Her smoking use included cigarettes. She has a 3.00 pack-year smoking history. she has never used smokeless tobacco.  Past Surgical History:  Procedure Laterality Date  . BACK SURGERY    . BILATERAL SALPINGOOPHORECTOMY Bilateral   . CARPAL TUNNEL RELEASE Right   . DILATION AND CURETTAGE OF UTERUS    . HEMORRHOID SURGERY    . [place] SURGERY  2013?  [facility] POSTERIOR CERVICAL FUSION/FORAMINOTOMY    . REFRACTIVE SURGERY Bilateral   . TOTAL THYROIDECTOMY  ?1974  . VAGINAL HYSTERECTOMY    . VIDEO BRONCHOSCOPY Bilateral 11/28/2016   Procedure: VIDEO BRONCHOSCOPY WITHOUT FLUORO;  Surgeon: Brand Males, MD;  Location: [facility];  Service: Cardiopulmonary;  Laterality: Bilateral;    Allergies  Allergen Reactions  . Imitrex [Sumatriptan] Other (See Comments)    syncope  . Latex     Blisters from prolonged exposure to latex  . Celebrex [Celecoxib] Other (See Comments)    palpations   . Codeine Itching  . Percocet [Oxycodone-Acetaminophen] Itching    Immunization History  Administered Date(s) Administered  . Influenza, High Dose Seasonal PF 04/12/2016, 05/10/2017  . Pneumococcal Polysaccharide-23 03/31/2016    Family History  Problem Relation Age of Onset  . Cancer Sister        brain tumor  . Cancer Brother        unknown ca  . Kidney disease Mother   . COPD  Father        smoker     Current Outpatient Medications:  .  amLODipine (NORVASC) 5 MG tablet, Take 5 mg by mouth at bedtime., Disp: , Rfl:  .  aspirin EC 81 MG tablet, Take 81 mg by mouth daily., Disp: , Rfl:  .  azithromycin (ZITHROMAX) 500 MG tablet, Take 1 tablet (500 mg total) by mouth daily., Disp: 30 tablet, Rfl: 5 .  Biotin 5 MG CAPS, Take 5 mg by mouth daily., Disp: , Rfl:  .  Budesonide (PULMICORT FLEXHALER) 90 MCG/ACT inhaler, Inhale 1 puff into the lungs 2 (two) times daily., Disp: 1 Inhaler, Rfl: 0 .  diazepam (VALIUM) 10 MG tablet, Take 10 mg by mouth at bedtime. , Disp: , Rfl:  .  doxylamine, Sleep, (SLEEP AID) 25 MG tablet, Take 25 mg by mouth at bedtime., Disp: , Rfl:  .  ethambutol (MYAMBUTOL) 400 MG tablet, Take 2 tablets (800 mg total) by mouth daily., Disp: 60 tablet, Rfl: 5 .  famotidine (PEPCID) 20 MG tablet, One at bedtime, Disp: 30 tablet, Rfl: 11 .  fexofenadine (ALLEGRA) 180 MG tablet, Take 180 mg by mouth daily., Disp: , Rfl:  .  furosemide (LASIX) 20 MG tablet, TAKE 1 TABLET BY MOUTH ONCE DAILY, Disp: 90 tablet, Rfl: 0 .  levothyroxine (SYNTHROID, LEVOTHROID) 112 MCG tablet, Take 112  mcg by mouth daily before breakfast. Brand name only**, Disp: , Rfl:  .  naproxen (NAPROSYN) 250 MG tablet, Take 250 mg by mouth as needed., Disp: , Rfl:  .  rifampin (RIFADIN) 300 MG capsule, Take 2 capsules (600 mg total) by mouth daily., Disp: 60 capsule, Rfl: 5 .  sodium chloride (OCEAN) 0.65 % SOLN nasal spray, Place 1 spray into both nostrils at bedtime as needed for congestion., Disp: , Rfl:    Review of Systems     Objective:   Physical Exam Vitals:   08/03/17 1056  BP: 126/74  Pulse: 75  SpO2: 95%  Weight: 126 lb (57.2 kg)  Height: _0  (1.575 m)    Estimated body mass index is 23.05 kg/m as calculated from the following:   Height as of this encounter: _1  (1.575 m).   Weight as of this encounter: 126 lb (57.2 kg).  Discussion only visit      Assessment:       ICD-10-CM   1. 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 Encounter for therapeutic drug monitoring Z51.81   3. Chronic fatigue R53.82   4. Nausea R11.0        Plan:      Support holding off start of inhaled amikacin - after discussing your goals with Dr Linus Salmons of ID and you Support 2 week drug holiday of ethambutol, rifampin, and azithromycin at your request - have updated DR Comer Check CXR 2 view at your request Check Cbc, bmet, lft test 08/03/2017 at your request Await MAI suputum test from 07/11/17 - negative for growth as of 08/03/2017 but need 2-3 more weeks for final  Followup - will call with results - recommend that further management of the ID drugs and discussion be with infectious diseases Dr Linus Salmons -  consider 2nd opinion with DR Brigitte Pulse at [facility]- we can decide 2nd opinion after blood work   - 3 months or sooner with Dr Chase Caller   > 50% of this > 25 min visit spent in face to face counseling or coordination of care   D/w DR Comer  Dr. Brand Males, M.D., [facility].C.P Pulmonary and Critical Care Medicine Staff Physician, [place] Director - Interstitial Lung Disease  Program  [facility] [place] [facility] [place], [place], [address]  Pager: [phone], If no answer or between  15:00h - 7:00h: call [phone] Telephone: [phone]

## 2017-08-04 ENCOUNTER — Telehealth: Payer: Self-pay | Admitting: Pharmacist Clinician (PhC)/ Clinical Pharmacy Specialist

## 2017-08-04 MED ORDER — AMIKACIN SULFATE LIPOSOME 590 MG/8.4ML IN SUSP: 590.0000 mg | Freq: Every day | RESPIRATORY_TRACT | 11 refills | Status: DC

## 2017-08-04 NOTE — Telephone Encounter (Signed)
Arikace has been approved for her MAC. Called [employer] to let them know and they are aware. They said that she might want to wait til the new year to start.

## 2017-08-07 NOTE — Telephone Encounter (Signed)
I know, we both did extensively.  She just doesn't want to take it.  thanks

## 2017-08-07 NOTE — Telephone Encounter (Signed)
Thanks, she saw Dr. Chase Caller last week complaining about the medication and we didn't let her know about the hearing issue with amikacin and other complaints and is now on a two week break from the medications and not sure if she is going to continue.

## 2017-08-07 NOTE — Telephone Encounter (Signed)
I counseled her pretty good about the hearing issue at the recent visit. The hearing issue is more to do with IV amikacin rather than the inhaled. If she is referred to Dr. Lorenda Cahill, he'll probably do the same thing.

## 2017-08-23 ENCOUNTER — Encounter: Payer: Self-pay | Admitting: Internal Medicine

## 2017-08-23 ENCOUNTER — Ambulatory Visit: Payer: Medicare Other | Admitting: Internal Medicine

## 2017-08-23 DIAGNOSIS — H9311 Tinnitus, right ear: Secondary | ICD-10-CM

## 2017-08-23 DIAGNOSIS — A310 Pulmonary mycobacterial infection: Secondary | ICD-10-CM

## 2017-08-23 NOTE — Progress Notes (Signed)
   Subjective:    Patient ID: Gina Lyons, female    DOB: 11-Dec-1945, 72 y.o.   MRN: [id number]  HPI Here for follow up of MAI infection with associated bronchiectasis.   She started treatment with 3 drug therapy about 4 months ago based on a positive sputum culture for MAI and worsening subacute symptoms of severe cough, DOE and poor sleep related and I started her on treatment.  She tolerated this poorly and felt worsening fatigue, appetite and has noted some tinnitus that has persisted.  I repeated her sputum which is smear negative and culture, not yet final, is no growth to date.  She feels well today with minimal cough and not interested in pursuing further treamtent in the future.  She did not start the inhaled amikacin.  No fever, no chills.     Review of Systems  Constitutional: Negative for chills, fatigue and fever.  Skin: Negative for rash.  Neurological: Negative for dizziness.       Objective:   Physical Exam  Constitutional: She appears well-developed and well-nourished. No distress.  Eyes: No scleral icterus.  Cardiovascular: Normal rate, regular rhythm and normal heart sounds.          Assessment & Plan:

## 2017-08-23 NOTE — Assessment & Plan Note (Signed)
At this time, she is asymptomatic now and hopefully this will hold. Regardless, she does not want to consider treatment again so will observe off of treatment.  She will continue to follow with Dr. Chase Caller.

## 2017-08-23 NOTE — Assessment & Plan Note (Signed)
She will follow up with audiology if it persists.

## 2017-08-28 LAB — MYCOBACTERIA,CULT W/FLUOROCHROME SMEAR
MICRO NUMBER:: 81314940
SMEAR: NONE SEEN
SPECIMEN QUALITY:: ADEQUATE

## 2017-09-16 ENCOUNTER — Other Ambulatory Visit: Payer: Self-pay | Admitting: Internal Medicine

## 2017-09-16 DIAGNOSIS — I519 Heart disease, unspecified: Secondary | ICD-10-CM

## 2017-11-02 ENCOUNTER — Encounter: Payer: Self-pay | Admitting: Internal Medicine

## 2017-11-02 ENCOUNTER — Ambulatory Visit: Payer: Medicare Other | Admitting: Internal Medicine

## 2017-11-02 VITALS — BP 120/76 | HR 60 | Ht 62.0 in | Wt 131.0 lb

## 2017-11-02 DIAGNOSIS — J479 Bronchiectasis, uncomplicated: Secondary | ICD-10-CM | POA: Diagnosis not present

## 2017-11-02 DIAGNOSIS — R0982 Postnasal drip: Secondary | ICD-10-CM | POA: Diagnosis not present

## 2017-11-02 DIAGNOSIS — R053 Chronic cough: Secondary | ICD-10-CM

## 2017-11-02 DIAGNOSIS — R05 Cough: Secondary | ICD-10-CM | POA: Diagnosis not present

## 2017-11-02 NOTE — Patient Instructions (Addendum)
ICD-10-CM   1. Chronic cough R05   2. Post-nasal drip R09.82   3. Bronchiectasis without complication ([place]) T64.2    Glad better without MAI  Rx Moderate morning cough likely due to untreated post nasal drip + ongoing MAI related bronchiectasis  plan Continue spiriva Support holding off MAI Rx due  To intolerance Restart  take generic fluticasone inhaler 2 squirts each nostril daily  Followup 6 months or sooner if needed

## 2017-11-02 NOTE — Progress Notes (Signed)
Subjective:     Patient ID: Gina Lyons, female   DOB: Apr 28, 1946, 72 y.o.   MRN: [id number]  HPI    72 year old female former smoker, only 3 pack year history, and for pulmonary consult July 2017 for DVT (No PE on CT chest x 3 ) treated with Xarelto.   TEST [name].  Venous Doppler, 03/04/2016 showed DVT in the short segment of left peroneal vein CTA chest , 7/18 2017 negative for pulmonary embolism, bibasilar sub-solid pulmonary nodules measuring up to 7 mm CTA chest 03/19/2016 negative for pulmonary embolism, stable dilation of the ascending aorta CTA chest 03/30/2016 negative for pulmonary emboli, bilateral ground glass opacities, nodular opacities within the lingula, right middle lobe and right lower lobe VQ scan 03/31/16 bilateral patchy matched ventilation/perfusion defects, intermediate  probability for pulmonary embolus. 2D echo 03/31/16>EF 50-55%, gr 1 DD , PAP 48mHg Pulmonary function test on 03/31/2016 showed a FEV1 at 107%, ratio 77, FVC of 105%, no significant bronchodilator response, DLCO 63%  04/12/2016 post hospital follow-up Patient presents for a post hospital follow-up. It was diagnosed with a DVT on the left 03/04/2016. She was started on Xarelto. She was on Estradiol and she stopped this. CT chest on July 18 was negative for PE. Patient had 3 weeks of progressive shortness of breath She was admitted on 03/30/2016. Patient was admitted August 9 through 04/01/2016 for progressive shortness of breath. CT chest was done that was negative for pulmonary embolism. It did show bilateral groundglass opacities  and nodular opacities within the lingula, right middle lobe and right lower lobe. Was transitioned from XYodertoo Eliquis and hospitalization. Patient did undergo gentle diuresis. She was also treated with Levaquin for questionable atypical infection. A VQ scan was done that showed bilateral patchy matched ventilation and perfusion defects with intermittent probability for  pulmonary embolism. She says she was told she had a PE.from this .   A 2-D echo showed a normal EF at 50-55% with grade 1 diastolic dysfunctions and pulmonary artery pressures at 25 m of mercury. Pulmonary function test showed normal lung function with an FEV1 at 107%. Patient did have a diffusing defect =DLCO 63%.  Autoimmune and CTD workup was neg (HIV, ANCA, GBM, CCP , dsDNA ab , ANA )  Discharge. Patient is feeling some better but still gets winded with walking. No dyspnea at rest .  She denies cough, hemoptysis , abd pain, n/v./d. She does have some soreness along left LE.  Says prior to admit she was very active with yard work , cycling and house work. No dyspnea at all.  Prior to admit no long travel, injury , surgery . No hx of DVT/PE . No FH of DVT/PE.  No hx of cancer.     OV 05/13/2016  Chief Complaint  Patient presents with  . Follow-up    Pt here after CXR and seeing TP on 8.22.17. Pt states her SOB has not improved since also OV. PT c/o DOE, intermittent dry cough, chest tightness when SOB and resolves when she lays down. Pt states she had mild left foot swelling on 9.21.17, this has resolved. Pt denies f/c/s.     [name]89year old female with very limited remote smoking. In July 2017 she was admitted with left-sided DVT and was started on Xarelto. Sometime after that according to her history she started developing shortness of breath which has persisted to this day despite changes with Xarelto to es.E;liquis. This dyspnea is pervasive. It is moderate to  severe in intensity. It is present for exertion and relieved by rest. Class II-III levels of activity makes her dyspneic. There is no orthopnea paroxysmal nocturnal dyspnea. She had 3 CT scans of the chest in July 2017 that ruled out pulmonary embolism but in August 2017 she did have some new groundglass opacities. August 2017 VQ scan was of intermediate probability. Only function does show reduction in diffusion capacity  suggesting the groundglass opacities might be real. She did have autoimmune workup in August 2017 along with complement and vascular this workup that was normal. She presents today with her husband. She is really frustrated by her dyspnea. Chest x-ray personally visualized shows some nonspecific scarring. She is not able to do her household activities.  she has a dry cough of mild intensity for the last 6 months. Cough preceded onset of dyspnea and DVT. At last visit they specifically denied this cough. This no associated wheezing or orthopnea paroxysmal nocturnal dyspnea or chills.    Exhaled nitric oxide testing today in the office 05/13/2016: 23 ppb and normal   Walking desaturation test on 05/13/2016 185 feet x 3 laps:   did NOT desaturate. Rest pulse ox was 100%, final pulse ox was 100% was the final pulse ox w heart rate response was at rest78. HR response  to 94/min at peak exertion.    Noted SCL 70 not done (no dysphagia)  D dimer this visti 0.36 and n  ormal  Dg Chest 2 View  Result Date: 05/13/2016 CLINICAL DATA:  Follow-up pulmonary infiltrates. EXAM: CHEST  2 VIEW COMPARISON:  04/07/2016 and 03/31/2016 FINDINGS: Mild hyperinflation is stable. Stable linear densities in the anterior left lung. No significant airspace disease or consolidation. Heart and mediastinum are within normal limits and stable. Surgical plate in the lower cervical spine. Trachea is midline. No large pleural effusions. Old left seventh rib fracture. IMPRESSION: Stable linear densities in the left lung are probably related to scarring. No acute cardiopulmonary disease. Electronically Signed   By: Markus Daft M.D.   On: 05/13/2016 14:25       OV 06/01/2016  Chief Complaint  Patient presents with  . Follow-up    Pt here after CPST. Pt states her SOB has slightly improved since last OV. Pt c/o dry cough and occassional chest tightness - under breasts after eating dinner in evening. Pt denies chest congestion  and f/c/s.     Dyspnea evaluation. Here for review of pulmonary stress test. She underwent pulmonary stress test 05/26/2016. She gave an excellent effort. She had normal VO2 max and anaerobic threshold. Nevertheless there is a flattening of her O2 pulse curve that is significant. This is associated with tachypnea and dead space ventilation and approaching ventilatory limits. Pulse ox did not go down. In summary this is a combinati    She is reporting weight gain since a deep vein thrombosis. So far pulmonary embolism has never been confirmed. In fact the CT chest in October 2017 only shows groundglass opacities. Echocardiogram shows diastolic dysfunction. In addition after coming off her estrogen which she was taking for many years [in July 2017 after DVT] she is reporting significant hot flashes which are frustrating her. She continues to suffer from dyspnea and is frustrated by this as well.     OV 08/10/2016  Chief Complaint  Patient presents with  . Follow-up    Pt states overall her SOB has improved but has bad days every now and then. Pt c/o more SOB today and occassional  dry cough with some  frank blood - size of penny. Pt also states she has chest tightness when she is SOB - this resolves with rest.     Follow-up dyspnea: We finally determined that this dyspnea was unlikely due to pulmonary embolism. Was related to diastolic dysfunction and pulmonary groundglass infiltrates sustained in July 2017 at the time of deep vein thrombosis diagnosis. Last visit October 2017 I started Lasix therapy. Since then with the passage of time and Lasix therapy she has improved with her dyspnea. She still does have mild dyspnea on exertion. She has not attended pulmonary rehabilitation. She does not want to go to pulmonary habitation due to logistical issues and time issues. She and her husband said they will start exercising on a road bike for an exercise bike by themselves gradually.  In terms of deep  vein thrombosis related to estrogen intake.. She continues to be on anticoagulation since July 2017. She's completed 5 months of therapy. Interim d-dimer  normal. She has no bleeding complications. She wants to stop anticoagulation.  OV 10/25/2016  Chief Complaint  Patient presents with  . Follow-up    Pt here after HRCT. pt states her breathing is unchanged since last OV. Pt denies cough and CP/tightness.     Follow-up dyspnea: At last visit she told me that with Lasix therapy that her dyspnea improved. Today she tells me that she has now plateaued in terms of improvement. She still has dyspnea on exertion relieved by rest. The Lasix is helping but without the Lasix it is worse. It at the same time she feels she has not returned to her baseline pre-summer 2017. High-resolution CT scan of the chest done today and personally visualized shows the following abnormalities described suggestive of MAI infection  In terms of the left lower extremity DVT: In December 2017 we stopped anticoag based on normal D-dimer. She says since summer 2017 continues with daily left pain that sarts in ankle and spreads up to popliteal area along the venous course. On February 2018 on the 21st she had Doppler ultrasound of the left lower extremity that did not show any DVT. She also had chemistries that are normal. She takes Naprosyn once daily for this. She says all this is new since the DVT. She is frustrated by the pain. She says that primary care has deferred this issue to me also notices associated edema. She is wondering about vascular insufficiency and is requesting whether she should be referred to a vascular evaluation  Ct Chest High Resolution  Result Date: 10/25/2016 CLINICAL DATA:  Dyspnea and wheezing. Former smoker. History of pulmonary embolism and DVT. Follow-up abnormal chest CT. EXAM: CT CHEST WITHOUT CONTRAST TECHNIQUE: Multidetector CT imaging of the chest was performed following the standard protocol  without intravenous contrast. High resolution imaging of the lungs, as well as inspiratory and expiratory imaging, was performed. COMPARISON:  03/30/2016 chest CT angiogram. 07/08/2016 chest radiograph. FINDINGS: Cardiovascular: Normal heart size. No significant pericardial fluid/thickening. Left anterior descending coronary atherosclerosis. Atherosclerotic thoracic aorta with stable mild ectasia of the ascending thoracic aorta (maximum diameter 3.9 cm). Normal caliber pulmonary arteries. Mediastinum/Nodes: Status post left hemithyroidectomy. No discrete right thyroid lobe nodules. Unremarkable esophagus. No pathologically enlarged axillary, mediastinal or gross hilar lymph nodes, noting limited sensitivity for the detection of hilar adenopathy on this noncontrast study. Lungs/Pleura: No pneumothorax. No pleural effusion. There is mild-to-moderate cylindrical and varicoid bronchiectasis extensively distributed throughout both lungs, most prominent in the basilar right upper lobe, right  middle lobe and lingula. There is extensive patchy tree-in-bud and centrilobular nodularity throughout both lungs at the areas of bronchiectasis, which appears overall mildly worsened since 03/30/2016, including a new dominant nodular 13 x 11 mm opacity in the medial dependent right lower lobe (series 3/ image 76). Bandlike subpleural opacities at the areas of bronchiectasis in the basilar right upper lobe, medial segment right middle lobe and lingula are stable and compatible with postinfectious scars. Otherwise no acute consolidative airspace disease or lung masses. Mild centrilobular emphysema. Mild patchy air trapping in both lungs on the expiration sequence. No frank honeycombing. Upper abdomen: Partially visualized left parapelvic renal cysts. Musculoskeletal: No aggressive appearing focal osseous lesions. Partially visualized surgical hardware from ACDF in the lower cervical spine. Stable T9 vertebral hemangioma. Mild thoracic  spondylosis. IMPRESSION: 1. 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 Findings have mildly worsened in the interval since 03/30/2016, including a new dominant 13 mm nodular opacity in the right lower lobe. Recommend initial follow-up post treatment high-resolution chest CT in 6 months. 2. Mild patchy air trapping in both lungs, suggesting small airways disease. 3. Aortic atherosclerosis.  One vessel coronary atherosclerosis. Electronically Signed   By: Ilona Sorrel M.D.   On: 10/25/2016 10:07    OV 12/20/2016  Chief Complaint  Patient presents with  . Follow-up    Pt here after bronch. Pt states her breathing is unchanged since last OV. Pt c/o dry cough and chest heaviness. Pt denies f/c/s and CP/tightness.    Follow-up pulmonary infiltrates not otherwise specified: She underwent bronchoscopy with bronchoalveolar lavage of July 2018 which showed 57% monocytes and 26% neutrophils. Cultures are negative except for colonizing contaminant mold ; low colony count. Last CT scan was March 2018  Follow-up dyspnea: She continues to have dyspnea. She continues to insist that is all after the DVT. There are no new issues. She is planning [place] exercise [place]    03/20/2017 acute extended ov/Wert re: sob at rest and dry cough x one year  Chief Complaint  Patient presents with  . Acute Visit    Increased SOB, no energy, wheezing and non prod cough x 6 wks. Her cough is non prod, and esp worse at night, sometimes wakes her up.  No appetite.    after last ov with MR 12/20/16 (no change rx) Breathing was better for two weeks only if sitting but doe  x 50 ft  And mailbox and stops  200 ft flat ever since dvt "PE"  Since then sob at rest unless sleeping p diazepam and "sleeping pill" and tramadol not aware of noct symptoms but after wakes each day  Always stops due to wob just walking to kitchen  Cough worse/ very harsh dry day > noct  PC rec prozac higher dose but no better so back to 10 mg daily  No better on saba hfa/ neb   No obvious patterns in day to day or daytime variability or assoc excess/ purulent sputum or mucus plugs or hemoptysis or cp or chest tightness,  or overt sinus or hb symptoms. No unusual exp hx or h/o childhood pna/ asthma or knowledge of premature birth.    OV 03/28/2017  Chief Complaint  Patient presents with  . Follow-up  Pt saw MW on 7.30.2018 for an acute visit. Pt states her SOB has not improved since last OV. Pt states she still has a dry cough. Pt denies CP/tightness, f/c/s, chest congestion.    Savanah Bayles is had long-standing shortness of breath that remained somewhat unexplained. The onset was in summer 2017 at the time of admission for DVT. There is no clear-cut evidence of pulmonary embolism. She did have some groundglass opacities at that time. Initially diuresis helped but subsequently did not. Over time her CT scan of the chest to change to the extent that in spring 2018 dose addition she had MAI infection. I did a bronchoscopy in April 2018 and then saw her for follow-up in May 2018. At this point in time the cultures were negative but subsequently in June 2018 the cultures returned showing MAI infection. I had a phone conversation with her and we opted to follow because she was feeling better. Now she presents for follow-up since her visit in May 2018 she has had worsening shortness of breath. In the interim she even saw my colleague Dr. Legrand Como wert for worsening shortness of breath. Walking desaturation test 185 feet 3 laps on room air with a full head probe: d walk only one lap and got very short of breath. She was short of breath  for the remaining 510 minutes of a conversation in the room. She did not desaturate below 100% and she did not get tachycardic. She is also reporting severe associated significant fatigue. Dyspnea and symptoms are worse in the last few months. During household work is extremely tiring. She says the fatigue has been worked up extensively by her primary care physician without any obvious cause  Last d-dimer September 2017 was normal. normal. She's currently not on anticoagulation  Echocardiogram August 2017 with ejection fraction 55% with normal nuclear medicine cardiac stress test November 2017  Normal allergy tests July 2018  Chest x-ray 03/20/2017: Personally visualized. Chronic bronchitic changes. Radiology second running high resolution CT chest.  Results for SHARAY, BELLISSIMO (MRN [id number]) as of 03/28/2017 09:26  Ref. Range 11/28/2016 15:35  ACID FAST SMEAR (AFB) Unknown Rpt  ACID FAST CULTURE WITH REFLEXED SENSITIVITIES Unknown Rpt (A)  Acid Fast Culture Unknown Positive (A)  Organism ID Unknown Comment (A)  MAC SUSCEPTIBILITY BROTH   Unknown Rpt (A)  AFB ORGANISM ID BY DNA PROBE Unknown Rpt (A)   IMPRESSION: 1. 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 Findings have mildly worsened in the interval since 03/30/2016, including a new dominant 13 mm nodular opacity in the right lower lobe. Recommend initial follow-up post treatment high-resolution chest CT in 6 months. 2. Mild patchy air trapping in both lungs, suggesting small airways disease. 3. Aortic atherosclerosis.  One vessel coronary atherosclerosis.   Electronically Signed   By: Ilona Sorrel M.D.   On: 10/25/2016  10:07   OV 05/11/2017  Chief Complaint  Patient presents with  . Follow-up    Pt states that she still has tingling and pain in her ankle from the blood clot. Still becomes SOB on exertion, but is doing better. Does have a lot of dry coughing. Denies any CP.   Follow-up dyspnea that is now deemed due to bronchiectasis associated with MAI infection. At last visit d-dimer was high we did a  CT angiogram ruled out pulmonary embolism but did show bronchiectasis associated with MAI infection. I referred her to infectious diseases Dr.: She is now on MAI treatment. This is causing significant nausea and fatigue. The trying different protocols and it seems to be helping. With the MAI treatment her dyspnea might be better but her cough is definitely not worse. In association she also has other nonspecific different cans of symptoms. She did complain that last night her left cough is a bit swollen but rate is better. This no warmth. She does not want any duplex ultrasound for this at this point. Review of the notes indicate that have not tried inhaler therapy for her in the past. Of note she is finished a flu shot for this season   OV 06/19/2017  Chief Complaint  Patient presents with  . Follow-up    Pt still complains of both legs hurting, if anything pain has gotten worse. Also has complaints of dry cough, SOB on exertion and occ. CP.    Follow-up dyspnea in the setting of MAI infection  She continues on MAI treatment. She is supposed to come back in the early part of 2019 for some reason she had an appointment scheduled today.She says that Spiriva that I gave last emesis sample helped her dyspnea but then it gave her the shakes.  Willing to try another inhaler for dyspnea from another class.her main issue today is that she continues to have chron lower extremity pain on the left side. This is chronic. She needs to talk to primary care physician about this   OV 08/03/2017  Chief Complaint  Patient  presents with  . Follow-up    Pt stated that she has a lot of questions for MR and wanted an appt. Pt has been about the same since last visit. Has been having a lot of side effects from some meds she has been put on.    72 year old female for dyspnea in the setting of MAI infection  I just saw her June 19, 2017.  This visit is an acute visit.  She made this visit specifically because of concerns of drug intolerance with her MAI treatment.  She is here with her husband.  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  She is on triple drug regimen of ethambutol rifampin and azithromycin.  This gives a significant nausea and fatigue.  They did see infectious diseases Dr. Linus Salmons on July 11, 2017.  Repeat sputum culture was done and this did not show any acid-fast bacilli.  Culture is still pending although no growth so far.  Conversation was held with the patient and patient agreed to start inhaled tobramycin.  She does have a history of tinnitus which was noted by infectious diseases and audiology appointment has been set up but patient is now concerned about all the significant side effects of inhaled tobramycin that include ototoxicity.  She is frustrated about adding a fourth medication to a pre-existing regimen.  She says she does not understand the rationale.  She is frustrated that all aspects of her treatment of benefits, risks and implications of not been communicated with her although the documentation is otherwise.  She is asking for blood work and chest x-ray and guidance from me about decision making with her treatment.  I did speak with infectious diseases Dr. Linus Salmons who is supportive of goal-directed therapy.  Patient is a whether the disease has potential to progress without Mycobacterium avium complex treatment.  On the  other hand she has been explained to her that the fatigue and side effects of treatment can sometimes be overwhelming and sometimes  patients do opt for supportive care.  She had a husband wondering about a 2-week drug holiday of a 3 baseline drugs.  At this point she is not inclined to start inhaled tobramycin at all.  There are no other issues.   OV 11/02/2017  Chief Complaint  Patient presents with  . Follow-up    Pt is still coughing in the mornings and does still become SOB with exertion and occ. chest pressure.    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  She subsequently saw Dr. Domenica Reamer in January 2019 and has completely stopped her MAI treatment.  Sputum culture at that time was negative for growth [initial positive growth was April 2018].  She says her energy levels are much better and her fatigue is resolved after stopping MAI treatment.  Overall she feels better except early morning she has cough that lasts moderate amount of time and severity.  Once she starts a Spiriva she gets better.  She does have postnasal drip for which she only takes in [place] nasal spray.  She has a Flonase at home but she is not taking it.  She is a little bit frustrated with the cough but nevertheless is not overwhelming.  She prefers conservative line of treatment.     has a past medical history of Anxiety, Arthritis, Chronic lower back pain, Diastolic dysfunction, DVT (deep venous thrombosis) ([place]), DVT (deep venous thrombosis) ([place]), Dyspnea on exertion, Family history of adverse reaction to anesthesia, History of blood transfusion (1964), Hypertension, Hypothyroidism, Migraine, Pneumonia (02/2016), and Tumor, thyroid.   reports that she quit smoking about 42 years ago. Her smoking use included cigarettes. She has a 3.00 pack-year smoking history. she has never used smokeless tobacco.  Past Surgical History:  Procedure Laterality Date  . BACK SURGERY    . BILATERAL SALPINGOOPHORECTOMY Bilateral   .  CARPAL TUNNEL RELEASE Right   . DILATION AND CURETTAGE OF UTERUS    . HEMORRHOID SURGERY    . [place] SURGERY  2013?  [facility] POSTERIOR CERVICAL FUSION/FORAMINOTOMY    . REFRACTIVE SURGERY Bilateral   . TOTAL THYROIDECTOMY  ?1974  . VAGINAL HYSTERECTOMY    . VIDEO BRONCHOSCOPY Bilateral 11/28/2016   Procedure: VIDEO BRONCHOSCOPY WITHOUT FLUORO;  Surgeon: Brand Males, MD;  Location: [facility];  Service: Cardiopulmonary;  Laterality: Bilateral;    Allergies  Allergen Reactions  . Imitrex [Sumatriptan] Other (See Comments)    syncope  . Latex     Blisters from prolonged exposure to latex  . Celebrex [Celecoxib] Other (See Comments)    palpations   . Codeine Itching  . Percocet [Oxycodone-Acetaminophen] Itching    Immunization History  Administered Date(s) Administered  . Influenza, High Dose Seasonal PF 04/12/2016, 05/10/2017  . Pneumococcal Polysaccharide-23 03/31/2016    Family History  Problem Relation Age of Onset  . Cancer Sister        brain tumor  . Cancer Brother        unknown ca  . Kidney disease Mother   . COPD Father        smoker     Current Outpatient Medications:  .  amLODipine (NORVASC) 5 MG tablet, Take 5 mg by mouth at bedtime., Disp: , Rfl:  .  aspirin EC 81 MG tablet, Take 81 mg by mouth daily., Disp: , Rfl:  .  Biotin 5 MG CAPS, Take 5 mg by mouth daily., Disp: , Rfl:  .  diazepam (VALIUM) 10 MG tablet, Take 10 mg by mouth at bedtime. , Disp: , Rfl:  .  doxylamine, Sleep, (SLEEP AID) 25 MG tablet, Take 25 mg by mouth at bedtime., Disp: , Rfl:  .  ethambutol (MYAMBUTOL) 400 MG tablet, Take 2 tablets (800 mg total) by mouth daily., Disp: 60 tablet, Rfl: 5 .  famotidine (PEPCID) 20 MG tablet, One at bedtime, Disp: 30 tablet, Rfl: 11 .  fexofenadine (ALLEGRA) 180 MG tablet, Take 180 mg by mouth daily., Disp: , Rfl:  .  furosemide (LASIX) 20 MG tablet, TAKE 1 TABLET BY MOUTH ONCE DAILY, Disp: 90 tablet, Rfl: 0 .  levothyroxine (SYNTHROID,  LEVOTHROID) 112 MCG tablet, Take 112 mcg by mouth daily before breakfast. Brand name only**, Disp: , Rfl:  .  sodium chloride (OCEAN) 0.65 % SOLN nasal spray, Place 1 spray into both nostrils at bedtime as needed for congestion., Disp: , Rfl:  .  Tiotropium Bromide Monohydrate (SPIRIVA RESPIMAT) 2.5 MCG/ACT AERS, Inhale 2 puffs into the lungs daily., Disp: , Rfl:    Review of Systems     Objective:   Physical Exam  Constitutional: She is oriented to person, place, and time. She appears well-developed and well-nourished. No distress.  HENT:  Head: Normocephalic and atraumatic.  Right Ear: External ear normal.  Left Ear: External ear normal.  Mouth/Throat: Oropharynx is clear and moist. No oropharyngeal exudate.  Eyes: Conjunctivae and EOM are normal. Pupils are equal, round, and reactive to light. Right eye exhibits no discharge. Left eye exhibits no discharge. No scleral icterus.  Neck: Normal range of motion. Neck supple. No JVD present. No tracheal deviation present. No thyromegaly present.  Cardiovascular: Normal rate, regular rhythm, normal heart sounds and intact distal pulses. Exam reveals no gallop and no friction rub.  No murmur heard. Pulmonary/Chest: Effort normal and breath sounds normal. No respiratory distress. She has no wheezes. She has no rales. She exhibits no tenderness.  Abdominal: Soft. Bowel sounds are normal. She exhibits no distension and no mass. There is no tenderness. There is no rebound and no guarding.  Musculoskeletal: Normal range of motion. She exhibits no edema or tenderness.  Lymphadenopathy:    She has no cervical adenopathy.  Neurological: She is alert and oriented to person, place, and time. She has normal reflexes. No cranial nerve deficit. She exhibits normal muscle tone. Coordination normal.  Skin: Skin is warm and dry. No rash noted. She is not diaphoretic. No erythema. No pallor.  Psychiatric: She has a normal mood and affect. Her behavior is  normal. Judgment and thought content normal.  Vitals reviewed.   Vitals:   11/02/17 1156  BP: 120/76  Pulse: 60  SpO2: 97%  Weight: 131 lb (59.4 kg)  Height: _0  (1.575 m)    Estimated body mass index is 23.96 kg/m as calculated from the following:   Height as of this encounter: _1  (1.575 m).   Weight as of this encounter: 131 lb (59.4 kg).      Assessment:       ICD-10-CM   1. Chronic cough R05   2. Post-nasal drip R09.82   3. Bronchiectasis without complication ([place]) B35.3        Plan:      Glad better without MAI  Rx Moderate morning cough  likely due to untreated post nasal drip + ongoing MAI related bronchiectasis  plan Continue spiriva Support holding off MAI Rx due  To intolerance Restart  take generic fluticasone inhaler 2 squirts each nostril daily  Followup 6 months or sooner if needed     Dr. Brand Males, M.D., [facility].C.P Pulmonary and Critical Care Medicine Staff Physician, [place] Director - Interstitial Lung Disease  Program  Pulmonary [place] at [place], [place], [address]  Pager: [phone], If no answer or between  15:00h - 7:00h: call [phone] Telephone: [phone]

## 2017-11-19 ENCOUNTER — Other Ambulatory Visit: Payer: Self-pay | Admitting: Internal Medicine

## 2017-11-19 DIAGNOSIS — I519 Heart disease, unspecified: Secondary | ICD-10-CM

## 2017-12-15 ENCOUNTER — Ambulatory Visit: Payer: Medicare Other | Admitting: Internal Medicine

## 2017-12-15 ENCOUNTER — Encounter: Payer: Self-pay | Admitting: Internal Medicine

## 2017-12-15 ENCOUNTER — Ambulatory Visit (INDEPENDENT_AMBULATORY_CARE_PROVIDER_SITE_OTHER)
Admission: RE | Admit: 2017-12-15 | Discharge: 2017-12-15 | Disposition: A | Discharge status: Home / Self Care | Payer: Medicare Other | Source: Ambulatory Visit | Attending: Internal Medicine | Admitting: Internal Medicine

## 2017-12-15 ENCOUNTER — Other Ambulatory Visit (INDEPENDENT_AMBULATORY_CARE_PROVIDER_SITE_OTHER): Payer: Medicare Other

## 2017-12-15 ENCOUNTER — Telehealth: Payer: Self-pay | Admitting: Internal Medicine

## 2017-12-15 VITALS — BP 102/64 | HR 79 | Ht 62.0 in | Wt 131.0 lb

## 2017-12-15 DIAGNOSIS — R0609 Other forms of dyspnea: Secondary | ICD-10-CM

## 2017-12-15 DIAGNOSIS — R0602 Shortness of breath: Secondary | ICD-10-CM | POA: Diagnosis not present

## 2017-12-15 DIAGNOSIS — R5383 Other fatigue: Secondary | ICD-10-CM

## 2017-12-15 LAB — CBC WITH DIFFERENTIAL/PLATELET
Basophils Absolute: 0.1 10*3/uL (ref 0.0–0.1)
Basophils Relative: 1.2 % (ref 0.0–3.0)
Eosinophils Absolute: 0.2 10*3/uL (ref 0.0–0.7)
Eosinophils Relative: 3.6 % (ref 0.0–5.0)
HEMATOCRIT: 39.2 % (ref 36.0–46.0)
Hemoglobin: 13.2 g/dL (ref 12.0–15.0)
LYMPHS ABS: 1.5 10*3/uL (ref 0.7–4.0)
LYMPHS PCT: 27.1 % (ref 12.0–46.0)
MCHC: 33.7 g/dL (ref 30.0–36.0)
MCV: 94.8 fl (ref 78.0–100.0)
MONOS PCT: 9.4 % (ref 3.0–12.0)
Monocytes Absolute: 0.5 10*3/uL (ref 0.1–1.0)
NEUTROS PCT: 58.7 % (ref 43.0–77.0)
Neutro Abs: 3.2 10*3/uL (ref 1.4–7.7)
Platelets: 307 10*3/uL (ref 150.0–400.0)
RBC: 4.14 Mil/uL (ref 3.87–5.11)
RDW: 13.3 % (ref 11.5–15.5)
WBC: 5.4 10*3/uL (ref 4.0–10.5)

## 2017-12-15 LAB — HEPATIC FUNCTION PANEL
ALT: 22 U/L (ref 0–35)
AST: 24 U/L (ref 0–37)
Albumin: 4.4 g/dL (ref 3.5–5.2)
Alkaline Phosphatase: 105 U/L (ref 39–117)
BILIRUBIN TOTAL: 0.3 mg/dL (ref 0.2–1.2)
Bilirubin, Direct: 0.1 mg/dL (ref 0.0–0.3)
Total Protein: 7.1 g/dL (ref 6.0–8.3)

## 2017-12-15 LAB — BASIC METABOLIC PANEL
BUN: 21 mg/dL (ref 6–23)
CO2: 28 mEq/L (ref 19–32)
Calcium: 9.3 mg/dL (ref 8.4–10.5)
Chloride: 105 mEq/L (ref 96–112)
Creatinine, Ser: 0.78 mg/dL (ref 0.40–1.20)
GFR: 76.69 mL/min (ref >60.00)
GLUCOSE: 81 mg/dL (ref 70–99)
POTASSIUM: 3.9 meq/L (ref 3.5–5.1)
SODIUM: 141 meq/L (ref 135–145)

## 2017-12-15 MED ORDER — MOMETASONE FUROATE 100 MCG/ACT IN AERO: 2.0000 | INHALATION_SPRAY | Freq: Two times a day (BID) | RESPIRATORY_TRACT | 0 refills | Status: DC

## 2017-12-15 MED ORDER — PREDNISONE 10 MG PO TABS: ORAL_TABLET | ORAL | 0 refills | Status: DC

## 2017-12-15 MED ORDER — CEPHALEXIN 500 MG PO CAPS: 500.0000 mg | ORAL_CAPSULE | Freq: Three times a day (TID) | ORAL | 0 refills | Status: DC

## 2017-12-15 NOTE — Progress Notes (Signed)
Subjective:     Patient ID: Gina Lyons, female   DOB: 1946/08/10, 72 y.o.   MRN: [id number]  HPI    72 year old female former smoker, only 3 pack year history, and for pulmonary consult July 2017 for DVT (No PE on CT chest x 3 ) treated with Xarelto.   TEST [name].  Venous Doppler, 03/04/2016 showed DVT in the short segment of left peroneal vein CTA chest , 7/18 2017 negative for pulmonary embolism, bibasilar sub-solid pulmonary nodules measuring up to 7 mm CTA chest 03/19/2016 negative for pulmonary embolism, stable dilation of the ascending aorta CTA chest 03/30/2016 negative for pulmonary emboli, bilateral ground glass opacities, nodular opacities within the lingula, right middle lobe and right lower lobe VQ scan 03/31/16 bilateral patchy matched ventilation/perfusion defects, intermediate  probability for pulmonary embolus. 2D echo 03/31/16>EF 50-55%, gr 1 DD , PAP 81mHg Pulmonary function test on 03/31/2016 showed a FEV1 at 107%, ratio 77, FVC of 105%, no significant bronchodilator response, DLCO 63%  04/12/2016 post hospital follow-up Patient presents for a post hospital follow-up. It was diagnosed with a DVT on the left 03/04/2016. She was started on Xarelto. She was on Estradiol and she stopped this. CT chest on July 18 was negative for PE. Patient had 3 weeks of progressive shortness of breath She was admitted on 03/30/2016. Patient was admitted August 9 through 04/01/2016 for progressive shortness of breath. CT chest was done that was negative for pulmonary embolism. It did show bilateral groundglass opacities  and nodular opacities within the lingula, right middle lobe and right lower lobe. Was transitioned from XHahiratoo Eliquis and hospitalization. Patient did undergo gentle diuresis. She was also treated with Levaquin for questionable atypical infection. A VQ scan was done that showed bilateral patchy matched ventilation and perfusion defects with intermittent probability for  pulmonary embolism. She says she was told she had a PE.from this .   A 2-D echo showed a normal EF at 50-55% with grade 1 diastolic dysfunctions and pulmonary artery pressures at 25 m of mercury. Pulmonary function test showed normal lung function with an FEV1 at 107%. Patient did have a diffusing defect =DLCO 63%.  Autoimmune and CTD workup was neg (HIV, ANCA, GBM, CCP , dsDNA ab , ANA )  Discharge. Patient is feeling some better but still gets winded with walking. No dyspnea at rest .  She denies cough, hemoptysis , abd pain, n/v./d. She does have some soreness along left LE.  Says prior to admit she was very active with yard work , cycling and house work. No dyspnea at all.  Prior to admit no long travel, injury , surgery . No hx of DVT/PE . No FH of DVT/PE.  No hx of cancer.     OV 05/13/2016  Chief Complaint  Patient presents with  . Follow-up    Pt here after CXR and seeing TP on 8.22.17. Pt states her SOB has not improved since also OV. PT c/o DOE, intermittent dry cough, chest tightness when SOB and resolves when she lays down. Pt states she had mild left foot swelling on 9.21.17, this has resolved. Pt denies f/c/s.     [name]89year old female with very limited remote smoking. In July 2017 she was admitted with left-sided DVT and was started on Xarelto. Sometime after that according to her history she started developing shortness of breath which has persisted to this day despite changes with Xarelto to es.E;liquis. This dyspnea is pervasive. It is moderate to  severe in intensity. It is present for exertion and relieved by rest. Class II-III levels of activity makes her dyspneic. There is no orthopnea paroxysmal nocturnal dyspnea. She had 3 CT scans of the chest in July 2017 that ruled out pulmonary embolism but in August 2017 she did have some new groundglass opacities. August 2017 VQ scan was of intermediate probability. Only function does show reduction in diffusion capacity  suggesting the groundglass opacities might be real. She did have autoimmune workup in August 2017 along with complement and vascular this workup that was normal. She presents today with her husband. She is really frustrated by her dyspnea. Chest x-ray personally visualized shows some nonspecific scarring. She is not able to do her household activities.  she has a dry cough of mild intensity for the last 6 months. Cough preceded onset of dyspnea and DVT. At last visit they specifically denied this cough. This no associated wheezing or orthopnea paroxysmal nocturnal dyspnea or chills.    Exhaled nitric oxide testing today in the office 05/13/2016: 23 ppb and normal   Walking desaturation test on 05/13/2016 185 feet x 3 laps:   did NOT desaturate. Rest pulse ox was 100%, final pulse ox was 100% was the final pulse ox w heart rate response was at rest78. HR response  to 94/min at peak exertion.    Noted SCL 70 not done (no dysphagia)  D dimer this visti 0.36 and n  ormal  Dg Chest 2 View  Result Date: 05/13/2016 CLINICAL DATA:  Follow-up pulmonary infiltrates. EXAM: CHEST  2 VIEW COMPARISON:  04/07/2016 and 03/31/2016 FINDINGS: Mild hyperinflation is stable. Stable linear densities in the anterior left lung. No significant airspace disease or consolidation. Heart and mediastinum are within normal limits and stable. Surgical plate in the lower cervical spine. Trachea is midline. No large pleural effusions. Old left seventh rib fracture. IMPRESSION: Stable linear densities in the left lung are probably related to scarring. No acute cardiopulmonary disease. Electronically Signed   By: Markus Daft M.D.   On: 05/13/2016 14:25       OV 06/01/2016  Chief Complaint  Patient presents with  . Follow-up    Pt here after CPST. Pt states her SOB has slightly improved since last OV. Pt c/o dry cough and occassional chest tightness - under breasts after eating dinner in evening. Pt denies chest congestion  and f/c/s.     Dyspnea evaluation. Here for review of pulmonary stress test. She underwent pulmonary stress test 05/26/2016. She gave an excellent effort. She had normal VO2 max and anaerobic threshold. Nevertheless there is a flattening of her O2 pulse curve that is significant. This is associated with tachypnea and dead space ventilation and approaching ventilatory limits. Pulse ox did not go down. In summary this is a combinati    She is reporting weight gain since a deep vein thrombosis. So far pulmonary embolism has never been confirmed. In fact the CT chest in October 2017 only shows groundglass opacities. Echocardiogram shows diastolic dysfunction. In addition after coming off her estrogen which she was taking for many years [in July 2017 after DVT] she is reporting significant hot flashes which are frustrating her. She continues to suffer from dyspnea and is frustrated by this as well.     OV 08/10/2016  Chief Complaint  Patient presents with  . Follow-up    Pt states overall her SOB has improved but has bad days every now and then. Pt c/o more SOB today and occassional  dry cough with some  frank blood - size of penny. Pt also states she has chest tightness when she is SOB - this resolves with rest.     Follow-up dyspnea: We finally determined that this dyspnea was unlikely due to pulmonary embolism. Was related to diastolic dysfunction and pulmonary groundglass infiltrates sustained in July 2017 at the time of deep vein thrombosis diagnosis. Last visit October 2017 I started Lasix therapy. Since then with the passage of time and Lasix therapy she has improved with her dyspnea. She still does have mild dyspnea on exertion. She has not attended pulmonary rehabilitation. She does not want to go to pulmonary habitation due to logistical issues and time issues. She and her husband said they will start exercising on a road bike for an exercise bike by themselves gradually.  In terms of deep  vein thrombosis related to estrogen intake.. She continues to be on anticoagulation since July 2017. She's completed 5 months of therapy. Interim d-dimer  normal. She has no bleeding complications. She wants to stop anticoagulation.  OV 10/25/2016  Chief Complaint  Patient presents with  . Follow-up    Pt here after HRCT. pt states her breathing is unchanged since last OV. Pt denies cough and CP/tightness.     Follow-up dyspnea: At last visit she told me that with Lasix therapy that her dyspnea improved. Today she tells me that she has now plateaued in terms of improvement. She still has dyspnea on exertion relieved by rest. The Lasix is helping but without the Lasix it is worse. It at the same time she feels she has not returned to her baseline pre-summer 2017. High-resolution CT scan of the chest done today and personally visualized shows the following abnormalities described suggestive of MAI infection  In terms of the left lower extremity DVT: In December 2017 we stopped anticoag based on normal D-dimer. She says since summer 2017 continues with daily left pain that sarts in ankle and spreads up to popliteal area along the venous course. On February 2018 on the 21st she had Doppler ultrasound of the left lower extremity that did not show any DVT. She also had chemistries that are normal. She takes Naprosyn once daily for this. She says all this is new since the DVT. She is frustrated by the pain. She says that primary care has deferred this issue to me also notices associated edema. She is wondering about vascular insufficiency and is requesting whether she should be referred to a vascular evaluation  Ct Chest High Resolution  Result Date: 10/25/2016 CLINICAL DATA:  Dyspnea and wheezing. Former smoker. History of pulmonary embolism and DVT. Follow-up abnormal chest CT. EXAM: CT CHEST WITHOUT CONTRAST TECHNIQUE: Multidetector CT imaging of the chest was performed following the standard protocol  without intravenous contrast. High resolution imaging of the lungs, as well as inspiratory and expiratory imaging, was performed. COMPARISON:  03/30/2016 chest CT angiogram. 07/08/2016 chest radiograph. FINDINGS: Cardiovascular: Normal heart size. No significant pericardial fluid/thickening. Left anterior descending coronary atherosclerosis. Atherosclerotic thoracic aorta with stable mild ectasia of the ascending thoracic aorta (maximum diameter 3.9 cm). Normal caliber pulmonary arteries. Mediastinum/Nodes: Status post left hemithyroidectomy. No discrete right thyroid lobe nodules. Unremarkable esophagus. No pathologically enlarged axillary, mediastinal or gross hilar lymph nodes, noting limited sensitivity for the detection of hilar adenopathy on this noncontrast study. Lungs/Pleura: No pneumothorax. No pleural effusion. There is mild-to-moderate cylindrical and varicoid bronchiectasis extensively distributed throughout both lungs, most prominent in the basilar right upper lobe, right  middle lobe and lingula. There is extensive patchy tree-in-bud and centrilobular nodularity throughout both lungs at the areas of bronchiectasis, which appears overall mildly worsened since 03/30/2016, including a new dominant nodular 13 x 11 mm opacity in the medial dependent right lower lobe (series 3/ image 76). Bandlike subpleural opacities at the areas of bronchiectasis in the basilar right upper lobe, medial segment right middle lobe and lingula are stable and compatible with postinfectious scars. Otherwise no acute consolidative airspace disease or lung masses. Mild centrilobular emphysema. Mild patchy air trapping in both lungs on the expiration sequence. No frank honeycombing. Upper abdomen: Partially visualized left parapelvic renal cysts. Musculoskeletal: No aggressive appearing focal osseous lesions. Partially visualized surgical hardware from ACDF in the lower cervical spine. Stable T9 vertebral hemangioma. Mild thoracic  spondylosis. IMPRESSION: 1. 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 Findings have mildly worsened in the interval since 03/30/2016, including a new dominant 13 mm nodular opacity in the right lower lobe. Recommend initial follow-up post treatment high-resolution chest CT in 6 months. 2. Mild patchy air trapping in both lungs, suggesting small airways disease. 3. Aortic atherosclerosis.  One vessel coronary atherosclerosis. Electronically Signed   By: Ilona Sorrel M.D.   On: 10/25/2016 10:07    OV 12/20/2016  Chief Complaint  Patient presents with  . Follow-up    Pt here after bronch. Pt states her breathing is unchanged since last OV. Pt c/o dry cough and chest heaviness. Pt denies f/c/s and CP/tightness.    Follow-up pulmonary infiltrates not otherwise specified: She underwent bronchoscopy with bronchoalveolar lavage of July 2018 which showed 57% monocytes and 26% neutrophils. Cultures are negative except for colonizing contaminant mold ; low colony count. Last CT scan was March 2018  Follow-up dyspnea: She continues to have dyspnea. She continues to insist that is all after the DVT. There are no new issues. She is planning to exercise [place]    03/20/2017 acute extended ov/Wert re: sob at rest and dry cough x one year  Chief Complaint  Patient presents with  . Acute Visit    Increased SOB, no energy, wheezing and non prod cough x 6 wks. Her cough is non prod, and esp worse at night, sometimes wakes her up.  No appetite.    after last ov with MR 12/20/16 (no change rx) Breathing was better for two weeks only if sitting but doe  x 50 ft  And mailbox and stops  200 ft flat ever since dvt "PE"  Since then sob at rest unless sleeping p diazepam and "sleeping pill" and tramadol not aware of noct symptoms but after wakes each day  Always stops due to wob just walking to kitchen  Cough worse/ very harsh dry day > noct  PC rec prozac higher dose but no better so back to 10 mg daily  No better on saba hfa/ neb   No obvious patterns in day to day or daytime variability or assoc excess/ purulent sputum or mucus plugs or hemoptysis or cp or chest tightness,  or overt sinus or hb symptoms. No unusual exp hx or h/o childhood pna/ asthma or knowledge of premature birth.    OV 03/28/2017  Chief Complaint  Patient presents with  . Follow-up  Pt saw MW on 7.30.2018 for an acute visit. Pt states her SOB has not improved since last OV. Pt states she still has a dry cough. Pt denies CP/tightness, f/c/s, chest congestion.    Gina Lyons is had long-standing shortness of breath that remained somewhat unexplained. The onset was in summer 2017 at the time of admission for DVT. There is no clear-cut evidence of pulmonary embolism. She did have some groundglass opacities at that time. Initially diuresis helped but subsequently did not. Over time her CT scan of the chest to change to the extent that in spring 2018 dose addition she had MAI infection. I did a bronchoscopy in April 2018 and then saw her for follow-up in May 2018. At this point in time the cultures were negative but subsequently in June 2018 the cultures returned showing MAI infection. I had a phone conversation with her and we opted to follow because she was feeling better. Now she presents for follow-up since her visit in May 2018 she has had worsening shortness of breath. In the interim she even saw my colleague Dr. Legrand Como wert for worsening shortness of breath. Walking desaturation test 185 feet 3 laps on room air with a full head probe: d walk only one lap and got very short of breath. She was short of breath  for the remaining 510 minutes of a conversation in the room. She did not desaturate below 100% and she did not get tachycardic. She is also reporting severe associated significant fatigue. Dyspnea and symptoms are worse in the last few months. During household work is extremely tiring. She says the fatigue has been worked up extensively by her primary care physician without any obvious cause  Last d-dimer September 2017 was normal. normal. She's currently not on anticoagulation  Echocardiogram August 2017 with ejection fraction 55% with normal nuclear medicine cardiac stress test November 2017  Normal allergy tests July 2018  Chest x-ray 03/20/2017: Personally visualized. Chronic bronchitic changes. Radiology second running high resolution CT chest.  Results for JETAUN, COLBATH (MRN [id number]) as of 03/28/2017 09:26  Ref. Range 11/28/2016 15:35  ACID FAST SMEAR (AFB) Unknown Rpt  ACID FAST CULTURE WITH REFLEXED SENSITIVITIES Unknown Rpt (A)  Acid Fast Culture Unknown Positive (A)  Organism ID Unknown Comment (A)  MAC SUSCEPTIBILITY BROTH   Unknown Rpt (A)  AFB ORGANISM ID BY DNA PROBE Unknown Rpt (A)   IMPRESSION: 1. 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 Findings have mildly worsened in the interval since 03/30/2016, including a new dominant 13 mm nodular opacity in the right lower lobe. Recommend initial follow-up post treatment high-resolution chest CT in 6 months. 2. Mild patchy air trapping in both lungs, suggesting small airways disease. 3. Aortic atherosclerosis.  One vessel coronary atherosclerosis.   Electronically Signed   By: Ilona Sorrel M.D.   On: 10/25/2016  10:07   OV 05/11/2017  Chief Complaint  Patient presents with  . Follow-up    Pt states that she still has tingling and pain in her ankle from the blood clot. Still becomes SOB on exertion, but is doing better. Does have a lot of dry coughing. Denies any CP.   Follow-up dyspnea that is now deemed due to bronchiectasis associated with MAI infection. At last visit d-dimer was high we did a  CT angiogram ruled out pulmonary embolism but did show bronchiectasis associated with MAI infection. I referred her to infectious diseases Dr.: She is now on MAI treatment. This is causing significant nausea and fatigue. The trying different protocols and it seems to be helping. With the MAI treatment her dyspnea might be better but her cough is definitely not worse. In association she also has other nonspecific different cans of symptoms. She did complain that last night her left cough is a bit swollen but rate is better. This no warmth. She does not want any duplex ultrasound for this at this point. Review of the notes indicate that have not tried inhaler therapy for her in the past. Of note she is finished a flu shot for this season   OV 06/19/2017  Chief Complaint  Patient presents with  . Follow-up    Pt still complains of both legs hurting, if anything pain has gotten worse. Also has complaints of dry cough, SOB on exertion and occ. CP.    Follow-up dyspnea in the setting of MAI infection  She continues on MAI treatment. She is supposed to come back in the early part of 2019 for some reason she had an appointment scheduled today.She says that Spiriva that I gave last emesis sample helped her dyspnea but then it gave her the shakes.  Willing to try another inhaler for dyspnea from another class.her main issue today is that she continues to have chron lower extremity pain on the left side. This is chronic. She needs to talk to primary care physician about this   OV 08/03/2017  Chief Complaint  Patient  presents with  . Follow-up    Pt stated that she has a lot of questions for MR and wanted an appt. Pt has been about the same since last visit. Has been having a lot of side effects from some meds she has been put on.    72 year old female for dyspnea in the setting of MAI infection  I just saw her June 19, 2017.  This visit is an acute visit.  She made this visit specifically because of concerns of drug intolerance with her MAI treatment.  She is here with her husband.  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  She is on triple drug regimen of ethambutol rifampin and azithromycin.  This gives a significant nausea and fatigue.  They did see infectious diseases Dr. Linus Salmons on July 11, 2017.  Repeat sputum culture was done and this did not show any acid-fast bacilli.  Culture is still pending although no growth so far.  Conversation was held with the patient and patient agreed to start inhaled tobramycin.  She does have a history of tinnitus which was noted by infectious diseases and audiology appointment has been set up but patient is now concerned about all the significant side effects of inhaled tobramycin that include ototoxicity.  She is frustrated about adding a fourth medication to a pre-existing regimen.  She says she does not understand the rationale.  She is frustrated that all aspects of her treatment of benefits, risks and implications of not been communicated with her although the documentation is otherwise.  She is asking for blood work and chest x-ray and guidance from me about decision making with her treatment.  I did speak with infectious diseases Dr. Linus Salmons who is supportive of goal-directed therapy.  Patient is a whether the disease has potential to progress without Mycobacterium avium complex treatment.  On the  other hand she has been explained to her that the fatigue and side effects of treatment can sometimes be overwhelming and sometimes  patients do opt for supportive care.  She had a husband wondering about a 2-week drug holiday of a 3 baseline drugs.  At this point she is not inclined to start inhaled tobramycin at all.  There are no other issues.   OV 11/02/2017  Chief Complaint  Patient presents with  . Follow-up    Pt is still coughing in the mornings and does still become SOB with exertion and occ. chest pressure.    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  She subsequently saw Dr. Domenica Reamer in January 2019 and has completely stopped her MAI treatment.  Sputum culture at that time was negative for growth [initial positive growth was April 2018].  She says her energy levels are much better and her fatigue is resolved after stopping MAI treatment.  Overall she feels better except early morning she has cough that lasts moderate amount of time and severity.  Once she starts a Spiriva she gets better.  She does have postnasal drip for which she only takes in [place] nasal spray.  She has a Flonase at home but she is not taking it.  She is a little bit frustrated with the cough but nevertheless is not overwhelming.  She prefers conservative line of treatment.   OV 12/15/2017  Chief Complaint  Patient presents with  . Follow-up    Pt has had increased fatigue and breathing has become worse and states it has been worse for several weeks. Pt's husband states all pt wants to do is sleep. Pt also has some mild coughing.    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  This is an acute visit.  According to the husband and the patient for the last 3 weeks has increased fatigue.  She is tired all the time even at rest.  When she exerts herself the fatigue is worse.  She stopped after walking from the house to the mailbox because  of fatigue.  The fatigue is associated with shortness of breath but only the shortness of breath is present on exertion and relieved by rest.  There is no associated chest pain orthopnea or proximal nocturnal dyspnea or worsening edema or hemoptysis or chest pain.  However there is some increased wheezing.  Also at night when she is sleeping she feels rattling in her chest.  There is no respiratory infection fever.  Just back in September 72 year old with pulmonary embolism.  The husband feels that his blood pressure is low because of the systolic being 423 but the mean arterial pressure is adequate.  She is currently not on MAI treatment due to intolerance.  Walking desaturation test 180 feet x 3 laps on room air with a forehead probe.  She started off at 100% and stopped when she finished one lap because of shortness of breath.  She only walk very slowly.  Pulse ox when she stopped was 96%.  It was a 4% point drop although still adequate. Resting HR 76 -> 110 and got tachycardic  REports not taking spiirva or pulmicort; definitely not taking pulmicort - pharmacy issues     has a past medical history of Anxiety, Arthritis, Chronic lower back  pain, Diastolic dysfunction, DVT (deep venous thrombosis) ([place]), DVT (deep venous thrombosis) ([place]), Dyspnea on exertion, Family history of adverse reaction to anesthesia, History of blood transfusion (1964), Hypertension, Hypothyroidism, Migraine, Pneumonia (02/2016), and Tumor, thyroid.   reports that she quit smoking about 42 years ago. Her smoking use included cigarettes. She has a 3.00 pack-year smoking history. She has never used smokeless tobacco.  Past Surgical History:  Procedure Laterality Date  . BACK SURGERY    . BILATERAL SALPINGOOPHORECTOMY Bilateral   . CARPAL TUNNEL RELEASE Right   . DILATION AND CURETTAGE OF UTERUS    . HEMORRHOID SURGERY    . [place] SURGERY  2013?  Marland Kitchen POSTERIOR CERVICAL FUSION/FORAMINOTOMY    . REFRACTIVE SURGERY  Bilateral   . TOTAL THYROIDECTOMY  ?1974  . VAGINAL HYSTERECTOMY    . VIDEO BRONCHOSCOPY Bilateral 11/28/2016   Procedure: VIDEO BRONCHOSCOPY WITHOUT FLUORO;  Surgeon: Brand Males, MD;  Location: [facility];  Service: Cardiopulmonary;  Laterality: Bilateral;    Allergies  Allergen Reactions  . Imitrex [Sumatriptan] Other (See Comments)    syncope  . Latex     Blisters from prolonged exposure to latex  . Celebrex [Celecoxib] Other (See Comments)    palpations   . Codeine Itching  . Percocet [Oxycodone-Acetaminophen] Itching    Immunization History  Administered Date(s) Administered  . Influenza, High Dose Seasonal PF 04/12/2016, 05/10/2017  . Pneumococcal Polysaccharide-23 03/31/2016    Family History  Problem Relation Age of Onset  . Cancer Sister        brain tumor  . Cancer Brother        unknown ca  . Kidney disease Mother   . COPD Father        smoker     Current Outpatient Medications:  .  amLODipine (NORVASC) 5 MG tablet, Take 5 mg by mouth at bedtime., Disp: , Rfl:  .  aspirin EC 81 MG tablet, Take 81 mg by mouth daily., Disp: , Rfl:  .  Biotin 5 MG CAPS, Take 5 mg by mouth daily., Disp: , Rfl:  .  diazepam (VALIUM) 10 MG tablet, Take 10 mg by mouth at bedtime. , Disp: , Rfl:  .  doxylamine, Sleep, (SLEEP AID) 25 MG tablet, Take 25 mg by mouth at bedtime., Disp: , Rfl:  .  fexofenadine (ALLEGRA) 180 MG tablet, Take 180 mg by mouth daily., Disp: , Rfl:  .  furosemide (LASIX) 20 MG tablet, TAKE 1 TABLET BY MOUTH ONCE DAILY, Disp: 90 tablet, Rfl: 0 .  levothyroxine (SYNTHROID, LEVOTHROID) 112 MCG tablet, Take 112 mcg by mouth daily before breakfast. Brand name only**, Disp: , Rfl:  .  sodium chloride (OCEAN) 0.65 % SOLN nasal spray, Place 1 spray into both nostrils at bedtime as needed for congestion., Disp: , Rfl:  .  Tiotropium Bromide Monohydrate (SPIRIVA RESPIMAT) 2.5 MCG/ACT AERS, Inhale 2 puffs into the lungs daily., Disp: , Rfl:  .  ethambutol  (MYAMBUTOL) 400 MG tablet, Take 2 tablets (800 mg total) by mouth daily. (Patient not taking: Reported on 12/15/2017), Disp: 60 tablet, Rfl: 5 .  famotidine (PEPCID) 20 MG tablet, One at bedtime (Patient not taking: Reported on 12/15/2017), Disp: 30 tablet, Rfl: 11   Review of Systems     Objective:   Physical Exam Vitals:   12/15/17 0951  BP: 102/64  Pulse: 79  SpO2: 94%  Weight: 131 lb (59.4 kg)  Height: _0  (1.575 m)    Estimated body mass index is 23.96  kg/m as calculated from the following:   Height as of this encounter: _0  (1.575 m).   Weight as of this encounter: 131 lb (59.4 kg).    General Appearance:   thin, fatigued looking, flat affect, looks stable   Head:    Normocephalic, without obvious abnormality, atraumatic  Eyes:    PERRL - yes, conjunctiva/corneas - clear      Ears:    Normal external ear canals, both ears  Nose:   NG tube - no  Throat:  ETT TUBE - no , OG tube - no  Neck:   Supple,  No enlargement/tenderness/nodules     Lungs:     Clear to auscultation bilaterally but at lung base has crackles  Chest wall:    No deformity  Heart:    S1 and S2 normal, no murmur, CVP - no.  Pressors - no  Abdomen:     Soft, no masses, no organomegaly  Genitalia:    Not done  Rectal:   not done  Extremities:   Extremities- intact with basal very trace edema and basal tenderness - no warmth     Skin:   Intact in exposed areas .     Neurologic:   Sedation - none -> RASS - na . Moves all 4s - yes. CAM-ICU - neg . Orientation - x3 +        Assessment:       ICD-10-CM   1. Dyspnea on exertion R06.09   2. Other fatigue R53.83    Unclear what is going on ? Silent viral AE - bronchiectasis ? Overacting BP med (bp lower than usual per husband), ? Deconditioning. Does not sound like PE recurrence     Plan:       Do cbc with diff, bmet, lft Do CXR 2 view Try backing off norvasc bp tablet at night if you feel bp is low Continue spiriva Add inhaled steroid -  either asmanex, or pulmicort or arnuity as insurance would allow Drink fluids Try empiric - Please take prednisone 40 mg x1 day, then 30 mg x1 day, then 20 mg x1 day, then 10 mg x1 day, and then 5 mg x1 day and stop - cephalexin 579m three times daily x  5 days - if unimproved or worse - call PCP KJani Gravel MD or go to ER  Followup  - per prior OV   Dr. MBrand Males M.D., FGlen Rose Medical CenterC.P Pulmonary and Critical Care Medicine Staff Physician, CBoonevilleDirector - Interstitial Lung Disease  Program  [place] [id number] Pager: [phone] If no answer or between  15:00h - 7:00h: call [phone] Telephone: [phone]

## 2017-12-15 NOTE — Telephone Encounter (Signed)
lmtcb for pt. Routing to [place] as this is not a triage message.

## 2017-12-15 NOTE — Telephone Encounter (Signed)
Called and spoke with pt letting her know the results of the labwork and to continue recs discussed at [place].  Pt expressed understanding. Nothing further needed at this time.

## 2017-12-15 NOTE — Telephone Encounter (Signed)
  Let [name] know that labs are normal and do not explain symptoms. Plan - follow my advice from [place]. To ER or PCP Jani Gravel, MD  if worse,. I have no idea what is going on    PULMONARY No results for input(s): PHART, PCO2ART, PO2ART, HCO3, TCO2, O2SAT in the last 168 hours.  Invalid input(s): PCO2, PO2  CBC Recent Labs  Lab 12/15/17 1053  HGB 13.2  HCT 39.2  WBC 5.4  PLT 307.0    COAGULATION No results for input(s): INR in the last 168 hours.  CARDIAC  No results for input(s): TROPONINI in the last 168 hours. No results for input(s): PROBNP in the last 168 hours.   CHEMISTRY Recent Labs  Lab 12/15/17 1053  NA 141  K 3.9  CL 105  CO2 28  GLUCOSE 81  BUN 21  CREATININE 0.78  CALCIUM 9.3   Estimated Creatinine Clearance: 48.8 mL/min (by C-G formula based on SCr of 0.78 mg/dL).   LIVER Recent Labs  Lab 12/15/17 1053  AST 24  ALT 22  ALKPHOS 105  BILITOT 0.3  PROT 7.1  ALBUMIN 4.4     INFECTIOUS No results for input(s): LATICACIDVEN, PROCALCITON in the last 168 hours.   ENDOCRINE CBG (last 3)  No results for input(s): GLUCAP in the last 72 hours.       IMAGING x48h  - image(s) personally visualized  -   highlighted in bold Dg Chest 2 View  Result Date: 12/15/2017 CLINICAL DATA:  Shortness of breath on exertion EXAM: CHEST - 2 VIEW COMPARISON:  08/03/2017 FINDINGS: Areas of bilateral scarring, stable. No acute confluent airspace opacities or effusions. Heart is normal size. No acute bony abnormality. IMPRESSION: Stable areas of scarring bilaterally.  No active disease. Electronically Signed   By: Rolm Baptise M.D.   On: 12/15/2017 11:09

## 2017-12-15 NOTE — Patient Instructions (Addendum)
ICD-10-CM   1. Dyspnea on exertion R06.09   2. Other fatigue R53.83    Do cbc with diff, bmet, lft Do CXR 2 view Try backing off norvasc bp tablet at night if you feel bp is low Continue spiriva Add inhaled steroid - either asmanex, or pulmicort or arnuity as insurance would allow Drink fluids Try empiric - Please take prednisone 40 mg x1 day, then 30 mg x1 day, then 20 mg x1 day, then 10 mg x1 day, and then 5 mg x1 day and stop - cephalexin 500mg  three times daily x  5 days - if unimproved or worse - call PCP Jani Gravel, MD or go to ER  Followup  - per prior OV

## 2017-12-15 NOTE — Addendum Note (Signed)
Addended by: Lorretta Harp on: 12/15/2017 03:04 PM   Modules accepted: Orders

## 2017-12-15 NOTE — Addendum Note (Signed)
Addended by: Lorretta Harp on: 12/15/2017 10:43 AM   Modules accepted: Orders

## 2018-01-22 ENCOUNTER — Other Ambulatory Visit: Payer: Self-pay | Admitting: Internal Medicine

## 2018-01-22 DIAGNOSIS — I519 Heart disease, unspecified: Secondary | ICD-10-CM

## 2018-02-15 ENCOUNTER — Other Ambulatory Visit: Payer: Self-pay | Admitting: Internal Medicine

## 2018-02-15 DIAGNOSIS — I519 Heart disease, unspecified: Secondary | ICD-10-CM

## 2018-02-26 IMAGING — CT CT ANGIO CHEST
2 of 8 series · 16 of 36 positions shown · IV contrast (ISOVUE 370)
Comparison: Chest CT October 25, 2016; chest radiograph March 20, 2017

CLINICAL DATA: Shortness of Breath

EXAM:
CT ANGIOGRAPHY CHEST WITH CONTRAST
TECHNIQUE: Multidetector CT imaging of the chest was performed using the
standard protocol during bolus administration of intravenous
contrast. Multiplanar CT image reconstructions and MIPs were
obtained to evaluate the vascular anatomy.
CONTRAST:  80 mL Isovue 370 nonionic

[Series 5: thins · axial · 0.62mm/px · z∈[-421,-183]mm · 15 of 273 slices shown]
[im 18/273  lung]
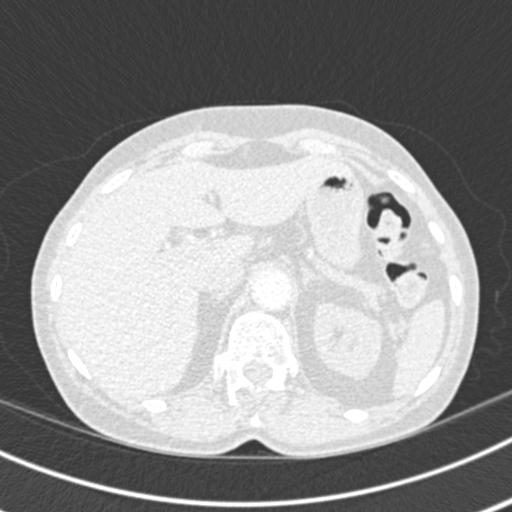
[im 35/273  mediastinal]
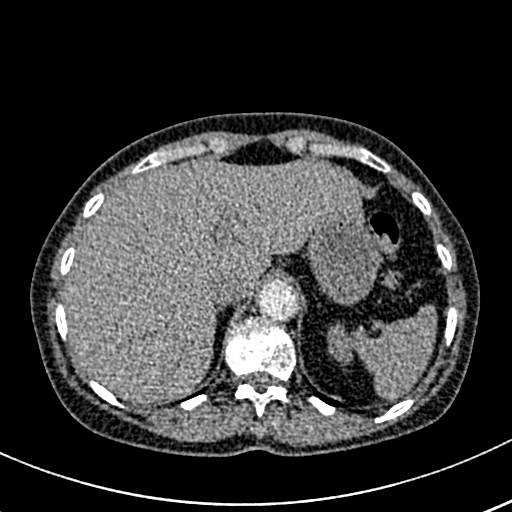
[im 52/273  lung]
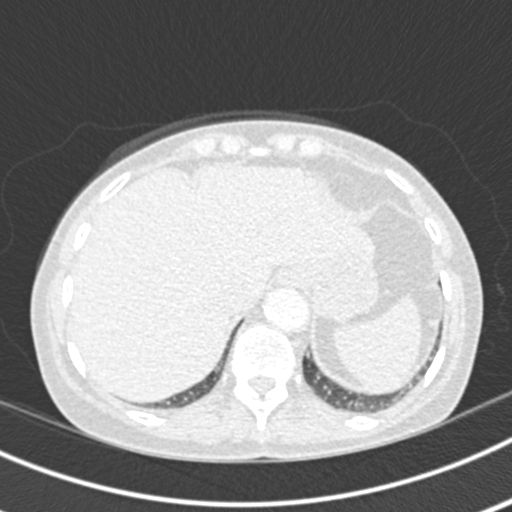
[im 69/273  mediastinal]
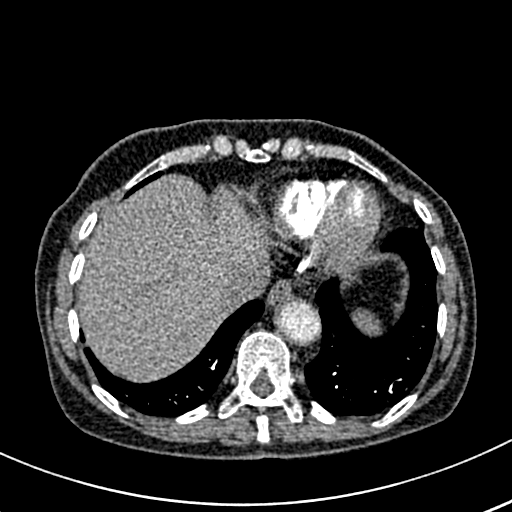
[im 86/273  lung]
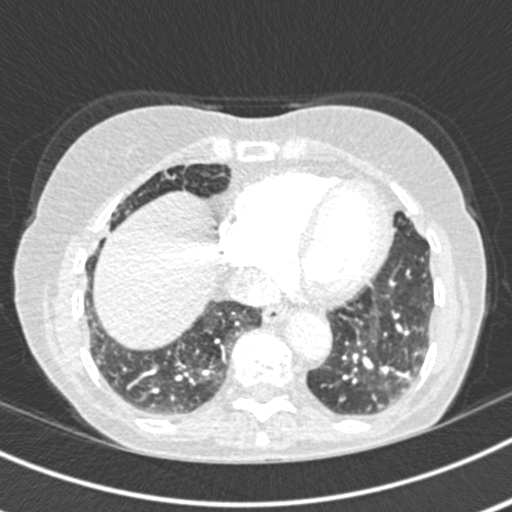
[im 103/273  mediastinal]
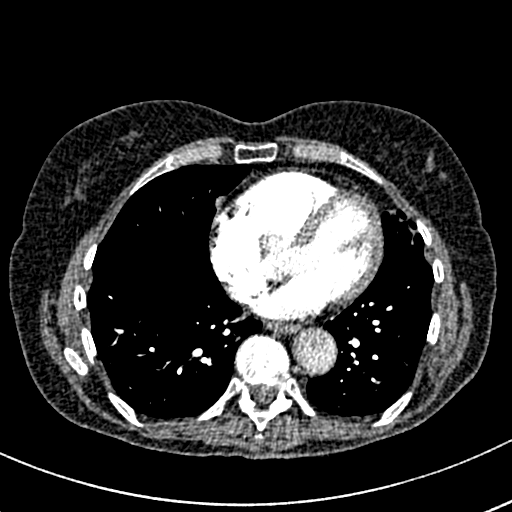
[im 120/273  lung]
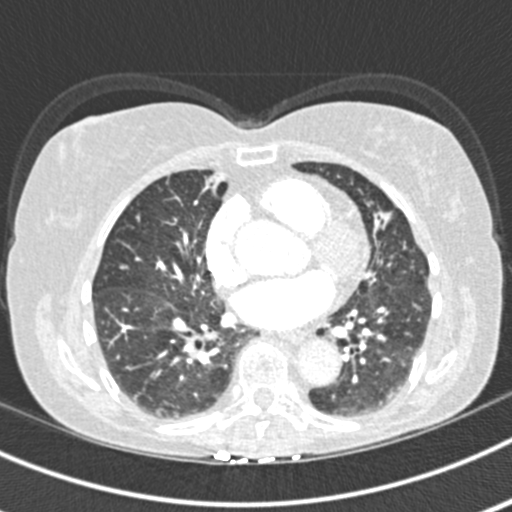
[im 137/273  mediastinal]
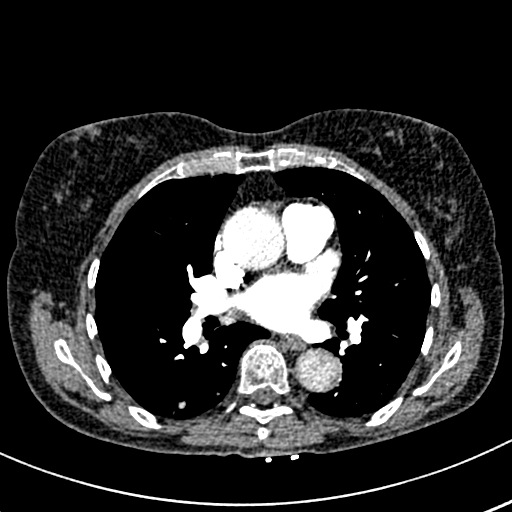
[im 154/273  lung]
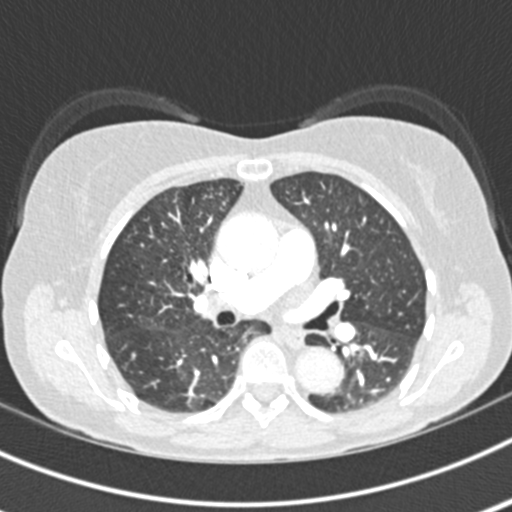
[im 171/273  mediastinal]
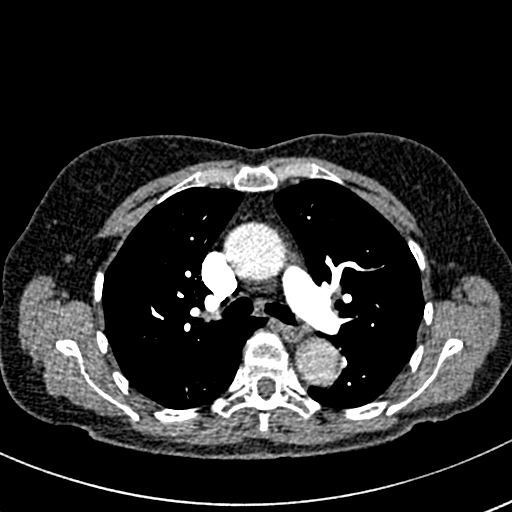
[im 188/273  lung]
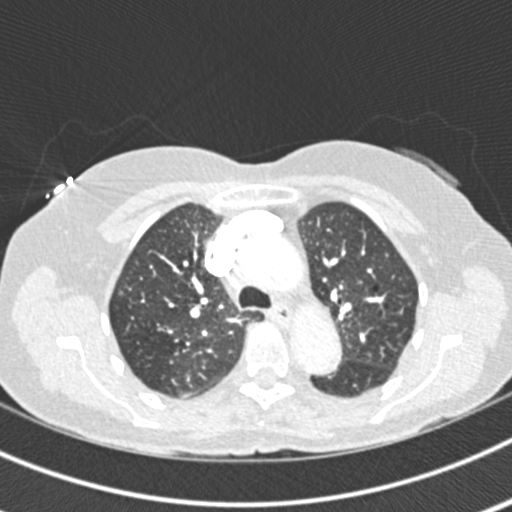
[im 205/273  mediastinal]
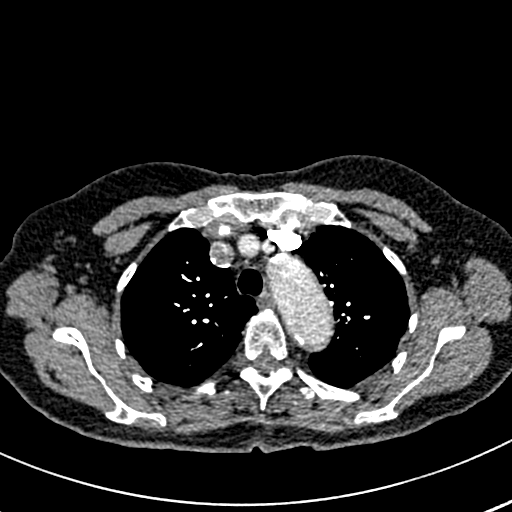
[im 222/273  lung]
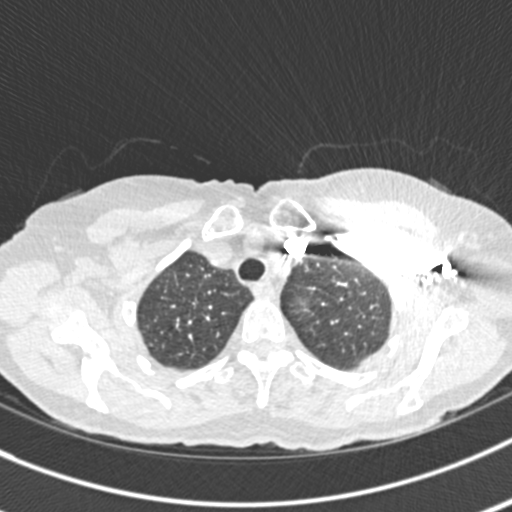
[im 239/273  mediastinal]
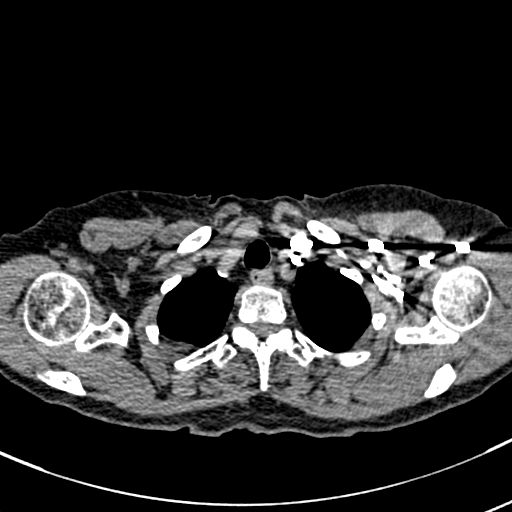
[im 256/273  lung]
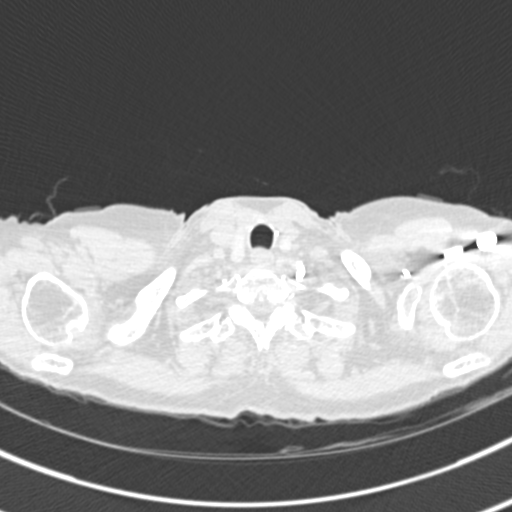

[Series 7: coronal mpr · coronal · 0.59mm/px · 1 of 138 slices shown]
[im 69/138  mediastinal]
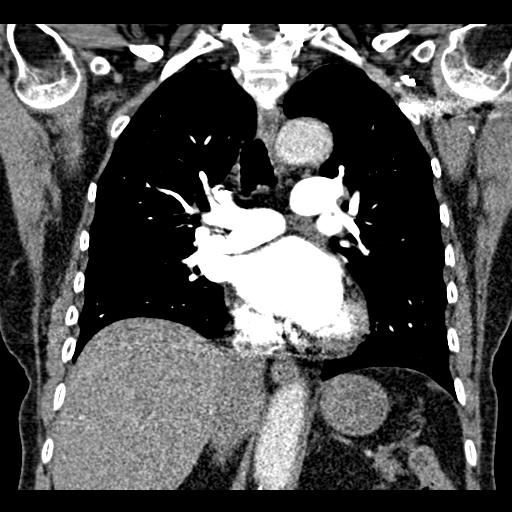

[16 of 36 positions shown; findings below may reference images not displayed]

FINDINGS: Cardiovascular: There is no demonstrable pulmonary embolus.
Ascending thoracic aorta has a maximum transverse diameter of 4.0 x
3.9 cm. There is no thoracic aortic dissection. Visualized great
vessels appear unremarkable with the exception of slight
calcification at the origin of the left subclavian artery. There are
foci of atherosclerotic calcification in the aorta. Pericardium is
not appreciably thickened.

Mediastinum/Nodes: Left lobe of thyroid absent. Right lobe thyroid
diminutive. No thyroid lesion evident. No evident adenopathy. No
esophageal lesion is evident on this study.

Lungs/Pleura: There is again noted bronchiectatic change
bilaterally, stable from prior study. There has been no progression
of bronchiectasis since prior study. Patchy areas of tree on Mychelle
type appearance are again noted in both lower lobes as well as in
the lingula, not progressed. There is a nodular opacity in the
superior segment of the right lower lobe measuring 7 x 7 mm seen on
axial slice 43 series 6, unchanged. There has been partial clearing
of opacity in the area of the nodular appearing lesion abutting the
pleura in the superior segment of the right lower lobe medially.
This area which measured 1.3 x 1.1 cm on the prior study now
measures 0.6 x 0.6 cm. Several small nodular opacities seen in the
left lower lobe on the prior study are either smaller or no longer
evident. Scarring in the inferior lingula is noted with slightly
less opacity in this area compared to most recent study. No new
parenchymal lung nodular opacities are appreciable. No pleural
effusion or pleural thickening evident. No well-defined
consolidation.

Upper Abdomen: Visualized upper abdominal structures appear
unremarkable except for mild atherosclerotic calcification in the
abdominal aorta.

Musculoskeletal: There is a hemangioma in the T9 vertebral body.
There is a smaller hemangioma in the T8 vertebral body. There are no
blastic or lytic bone lesions. There is postoperative change in the
visualized lower cervical spine.

Review of the MIP images confirms the above findings.
IMPRESSION: 1.  No demonstrable pulmonary embolus.

2. Ascending thoracic aorta has a maximum transverse diameter of
x 3.9 cm. Recommend annual imaging followup by CTA or MRA. This
recommendation follows 9050
ACCF/AHA/AATS/ACR/ASA/SCA/CUDNO/VIK/MCANALLY/MACHOPA Guidelines for the
Diagnosis and Management of Patients with Thoracic Aortic Disease.
Circulation. 9050; 121: [id number]. No dissection. Foci of
atherosclerotic calcification in the aorta noted.

3. The bronchiectatic change noted previously is again noted. There
are areas of scarring as well as areas of tree on Mychelle type
appearance, consistent with a degree of underlying chronic
pneumonitis. This appearance as noted previously is felt to likely
be due to chronic mycobacterium-avium intracellulare infection. No
new opacity is evident. No airspace consolidation. A nodular
appearing area in the medial aspect of the superior segment right
lower lobe has become smaller, likely due to clearing of small focus
of infiltrate. There remains a 7 x 7 mm nodular lesion in the right
lower lobe superior segment. No enlarging pulmonary lesions evident.

4.  No demonstrable adenopathy.

5.  Diminutive right lobe of thyroid.  Left lobe thyroid absent.

6.  Vertebral body hemangiomas at T8 and T9 are benign and stable.

Aortic Atherosclerosis ([id number]).

## 2018-03-29 ENCOUNTER — Other Ambulatory Visit: Payer: Self-pay | Admitting: *Deleted

## 2018-03-29 DIAGNOSIS — I712 Thoracic aortic aneurysm, without rupture, unspecified: Secondary | ICD-10-CM

## 2018-05-14 DIAGNOSIS — C44629 Squamous cell carcinoma of skin of left upper limb, including shoulder: Secondary | ICD-10-CM | POA: Diagnosis not present

## 2018-05-14 DIAGNOSIS — L820 Inflamed seborrheic keratosis: Secondary | ICD-10-CM | POA: Diagnosis not present

## 2018-05-14 DIAGNOSIS — C44311 Basal cell carcinoma of skin of nose: Secondary | ICD-10-CM | POA: Diagnosis not present

## 2018-05-15 ENCOUNTER — Other Ambulatory Visit: Payer: Self-pay | Admitting: Internal Medicine

## 2018-05-15 ENCOUNTER — Other Ambulatory Visit: Payer: Medicare Other

## 2018-05-15 DIAGNOSIS — I519 Heart disease, unspecified: Secondary | ICD-10-CM

## 2018-05-16 ENCOUNTER — Encounter: Payer: Self-pay | Admitting: Surgery

## 2018-05-16 ENCOUNTER — Ambulatory Visit: Payer: Medicare Other | Admitting: Surgery

## 2018-05-16 ENCOUNTER — Ambulatory Visit
Admission: RE | Admit: 2018-05-16 | Discharge: 2018-05-16 | Disposition: A | Discharge status: Home / Self Care | Payer: Medicare Other | Source: Ambulatory Visit | Attending: Surgery | Admitting: Surgery

## 2018-05-16 ENCOUNTER — Other Ambulatory Visit: Payer: Self-pay

## 2018-05-16 VITALS — BP 141/91 | HR 84 | Resp 16 | Ht 62.0 in | Wt 130.8 lb

## 2018-05-16 DIAGNOSIS — R0609 Other forms of dyspnea: Secondary | ICD-10-CM

## 2018-05-16 DIAGNOSIS — I712 Thoracic aortic aneurysm, without rupture, unspecified: Secondary | ICD-10-CM

## 2018-05-16 DIAGNOSIS — R918 Other nonspecific abnormal finding of lung field: Secondary | ICD-10-CM

## 2018-05-16 MED ORDER — IOPAMIDOL (ISOVUE-370) INJECTION 76%
75.0000 mL | Freq: Once | INTRAVENOUS | Status: AC | PRN
  Administered 2018-05-16: 75 mL via INTRAVENOUS

## 2018-05-16 NOTE — Progress Notes (Signed)
HPI:  The patient returns today for follow-up of a small ascending aortic aneurysm which was measured at 4.0 cm x 3.9 cm on CT scan one year ago.  Previous echocardiogram in 03/2016 showed a trileaflet aortic valve with normal mobility and mild regurgitation.  Left ventricular systolic function was normal.  She has had problems with chronic shortness of breath which started after prior pulmonary embolism and she has been followed by Dr. Chase Caller.  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  She was last seen by Dr. Chase Caller in April 2019 and was treated with a brief course of prednisone and antibiotics.  She said that he did not feel like there is anything else that he could do.  She continues to have exertional shortness of breath which she said is limiting her activity.  Current Outpatient Medications  Medication Sig Dispense Refill  . amLODipine (NORVASC) 5 MG tablet Take 5 mg by mouth at bedtime.    Marland Kitchen aspirin EC 81 MG tablet Take 81 mg by mouth daily.    . Biotin 5 MG CAPS Take 5 mg by mouth daily.    . diazepam (VALIUM) 10 MG tablet Take 10 mg by mouth at bedtime.     . famotidine (PEPCID) 20 MG tablet One at bedtime 30 tablet 11  . fexofenadine (ALLEGRA) 180 MG tablet Take 180 mg by mouth daily.    . furosemide (LASIX) 20 MG tablet TAKE 1 TABLET BY MOUTH ONCE DAILY 90 tablet 0  . levothyroxine (SYNTHROID, LEVOTHROID) 112 MCG tablet Take 112 mcg by mouth daily before breakfast. Brand name only**    . Mometasone Furoate (ASMANEX HFA) 100 MCG/ACT AERO Inhale 2 puffs into the lungs 2 (two) times daily. 1 Inhaler 0  . sodium chloride (OCEAN) 0.65 % SOLN nasal spray Place 1 spray into both nostrils at bedtime as needed for congestion.    . Tiotropium Bromide Monohydrate  (SPIRIVA RESPIMAT) 2.5 MCG/ACT AERS Inhale 2 puffs into the lungs daily.    Marland Kitchen ethambutol (MYAMBUTOL) 400 MG tablet Take 2 tablets (800 mg total) by mouth daily. (Patient not taking: Reported on 12/15/2017) 60 tablet 5   No current facility-administered medications for this visit.      Physical Exam: BP (!) 141/91 (BP Location: Right Arm, Patient Position: Sitting, Cuff Size: Large)   Pulse 84   Resp 16   Ht 5\' 2"  (1.575 m)   Wt 130 lb 12.8 oz (59.3 kg)   SpO2 97% Comment: ON RA  BMI 23.92 kg/m  She looks fatigued.   Cardiac exam shows a regular rate and rhythm with normal heart sounds.  There is no murmur. Lung exam reveals bilateral crackles. There is no peripheral edema.   Diagnostic Tests:  CLINICAL DATA:  Follow-up of mild aneurysmal disease of the thoracic aorta.  EXAM: CT ANGIOGRAPHY CHEST WITH CONTRAST  TECHNIQUE: Multidetector CT imaging of the chest was performed using the standard protocol during bolus administration of intravenous contrast. Multiplanar CT image reconstructions and MIPs were obtained to evaluate the vascular anatomy.  CONTRAST:  63mL ISOVUE-370 IOPAMIDOL (ISOVUE-370) INJECTION 76%  Creatinine was obtained on site at [place] at [address]  Results: Creatinine 0.8 mg/dL.  Estimated GFR 73 mL/minute.  COMPARISON:  04/25/2017, 03/30/2016 and 03/19/2016  FINDINGS: Cardiovascular: Prior studies were timed for pulmonary artery evaluation and also demonstrated much more significant pulsation artifact compared to the current study. The current study shows almost no pulsation artifact and is a much more accurate representation of the thoracic aorta. The aortic root at the level of the sinuses of Valsalva measures 3.6-3.7 cm. The ascending thoracic aorta measures 3.7-3.9 cm. The proximal arch measures 3.0 cm. The distal arch measures 2.9 cm. The descending thoracic aorta measures 2.5 cm. No significant atherosclerosis is  identified of the thoracic aorta. Proximal great vessels show normal branching anatomy and normal patency. No evidence of aortic dissection or vasculitis.  The heart size is normal. No pericardial fluid identified. No significant calcified coronary arterial plaque identified. Central pulmonary arteries are normal in caliber.  Mediastinum/Nodes: No enlarged mediastinal, hilar, or axillary lymph nodes. Thyroid gland, trachea, and esophagus demonstrate no significant findings.  Lungs/Pleura: Chronic lung disease and areas of parenchymal nodularity, scarring and interstitial thickening again noted. Largest area of focal nodularity in the posterior right lower lobe appears stable and measures approximately 6 x 8 mm. Over time many of the small bilateral nodular areas show a waxing and waning appearance. There is a new nodular area noted in the anterior left upper lobe measuring approximately 6 mm in thickness and extending to the anterior subpleural lung. This area has a bilobed appearance and measures up to 13 mm in length on the sagittal reconstructions. These nodular areas are presumably postinflammatory and related to underlying chronic lung disease. Given the 1 area of new nodularity present, some type of periodic follow-up by CT would be appropriate over roughly a 1 year interval.  Upper Abdomen: No acute abnormality.  Musculoskeletal: Stable small hemangioma of the T8 vertebral body and larger T9 hemangioma. No thoracic fractures.  Review of the MIP images confirms the above findings.  IMPRESSION: 1. The current study is the best evaluation the thoracic aorta of all of the patient's prior CT imaging. The thoracic aorta is top normal in caliber and not overtly aneurysmal. Maximum diameter of the ascending thoracic aorta is 3.9 cm. 2. Stable chronic lung disease with areas of parenchymal scarring and nodularity again noted. Over time, several of the nodular areas have  resolved or stayed stable. There is a new area of bilobed nodularity in the anterior left upper lobe. Parenchymal nodularity is felt to most likely relate to chronic lung disease. Given new area of nodularity, 1 year follow-up CT is felt to be appropriate.   Electronically Signed   By: Aletta Edouard M.D.   On: 05/16/2018 11:10   Impression:  She has a stable 3.9 cm fusiform ascending aortic aneurysm.  I stressed the importance of good blood pressure control.  I reviewed the CTA images with her and her husband and answered their questions.  She does have some new parenchymal nodularity in the anterior left upper lobe and I will obtain a CT of the chest in 1 year to follow-up on this as well as the ascending aortic aneurysm.  She is most concerned about her chronic shortness of breath and the limitations related to that and is planning to obtain a second evaluation probably at [facility].  Plan:  I will plan to see her back in 1 year with a CT of the chest without contrast.  I spent 15 minutes performing this established patient evaluation and > 50% of this time was spent face to face counseling and coordinating the  care of this patient's aortic aneurysm.    Gaye Pollack, MD [facility] [phone]

## 2018-07-09 DIAGNOSIS — L820 Inflamed seborrheic keratosis: Secondary | ICD-10-CM | POA: Diagnosis not present

## 2018-07-09 DIAGNOSIS — X32XXXA Exposure to sunlight, initial encounter: Secondary | ICD-10-CM | POA: Diagnosis not present

## 2018-07-09 DIAGNOSIS — Z23 Encounter for immunization: Secondary | ICD-10-CM | POA: Diagnosis not present

## 2018-07-09 DIAGNOSIS — I10 Essential (primary) hypertension: Secondary | ICD-10-CM | POA: Diagnosis not present

## 2018-07-09 DIAGNOSIS — L570 Actinic keratosis: Secondary | ICD-10-CM | POA: Diagnosis not present

## 2018-07-09 DIAGNOSIS — C44311 Basal cell carcinoma of skin of nose: Secondary | ICD-10-CM | POA: Diagnosis not present

## 2018-07-09 DIAGNOSIS — N390 Urinary tract infection, site not specified: Secondary | ICD-10-CM | POA: Diagnosis not present

## 2018-07-24 DIAGNOSIS — C44311 Basal cell carcinoma of skin of nose: Secondary | ICD-10-CM | POA: Diagnosis not present

## 2018-09-05 DIAGNOSIS — C44311 Basal cell carcinoma of skin of nose: Secondary | ICD-10-CM | POA: Diagnosis not present

## 2018-09-06 ENCOUNTER — Telehealth: Payer: Self-pay | Admitting: Internal Medicine

## 2018-09-06 DIAGNOSIS — R0609 Other forms of dyspnea: Secondary | ICD-10-CM

## 2018-09-06 DIAGNOSIS — J479 Bronchiectasis, uncomplicated: Secondary | ICD-10-CM

## 2018-09-06 NOTE — Telephone Encounter (Signed)
Dr Alverda Skeans at [facility] is one of the best. Great choice. Please ask patient when first appt is and alsodo referral and we can send records over and I Can also email him once we get date on appt

## 2018-09-06 NOTE — Telephone Encounter (Signed)
Spoke with pt, she states she moved to [facility] and would like a referral to a pulmonologist near her Dr. Acquanetta Sit. MR ok to send referral?

## 2018-09-06 NOTE — Telephone Encounter (Signed)
Referral order placed. ATC pt, no answer. Left message for pt to call back.

## 2018-09-11 NOTE — Telephone Encounter (Signed)
Called patient, unable to reach left message to give us a call back. 

## 2018-09-12 NOTE — Telephone Encounter (Signed)
Called patient, unable to reach left message to give us a call back. 

## 2018-09-13 NOTE — Telephone Encounter (Signed)
Called and left message for Patient to call back. 

## 2018-09-13 NOTE — Telephone Encounter (Signed)
Spoke with the pt and notified of referral  She verbalized understanding  Nothing further needed

## 2018-09-13 NOTE — Telephone Encounter (Signed)
Pt is calling back [phone]

## 2018-09-14 ENCOUNTER — Telehealth: Payer: Self-pay | Admitting: Internal Medicine

## 2018-09-14 NOTE — Telephone Encounter (Addendum)
Spoke with pt, she states the insurance needs [place] to do an exception for her to see this doctor. I called [employer] and this department is closed. Will try again on Monday during business hours.    Referral Notes  Number of Notes: 3  Type Date User Summary Attachment  General 09/06/2018 3:39 PM Harland German - -  Note   Fax confirmation received        Type Date User Summary Attachment  General 09/06/2018 3:13 PM Harland German - -  Note   Order printed and faxed         Type Date User Summary Attachment  Provider Comments 09/06/2018 2:27 PM Jannette Spanner, CMA Provider Comments -  Note   Patient lives in [facility] now and needs referral to Dr. Narda Bonds Fax number [phone]

## 2018-09-17 NOTE — Telephone Encounter (Signed)
We sent records to this office per request of the doctor I know that the PCC's would not do this im guessing maybe the doctor

## 2018-09-17 NOTE — Telephone Encounter (Signed)
Called [employer], [phone], was told to call [phone], for provider line, after being transferred to 5 different people for PA line, which is what I was told I needed.  I was told that I needed to give reason for Patient having to go to provider out of network. Called [phone], was transferred 3 times and placed on hold.  Was unable to speak with someone about coverage of termination.

## 2018-09-19 NOTE — Telephone Encounter (Signed)
Spoke with pt. Advised her that she will need to contact her insurance company and get a name of a physician that is in network with her plan. She will call us with this information and we can place the referral. Nothing further was needed.

## 2018-09-22 ENCOUNTER — Other Ambulatory Visit: Payer: Self-pay | Admitting: Internal Medicine

## 2018-09-22 DIAGNOSIS — I519 Heart disease, unspecified: Secondary | ICD-10-CM

## 2018-09-25 ENCOUNTER — Telehealth: Payer: Self-pay | Admitting: Internal Medicine

## 2018-09-25 DIAGNOSIS — R918 Other nonspecific abnormal finding of lung field: Secondary | ICD-10-CM

## 2018-09-25 NOTE — Telephone Encounter (Signed)
Spoke with pt and relayed that Dr Chase Caller said it was ok to put in referral.  Referral placed to pulmonary.  Nothing further needed.

## 2018-09-25 NOTE — Telephone Encounter (Signed)
Yes that is fine

## 2018-09-25 NOTE — Telephone Encounter (Signed)
Spoke with pt and she needed a referral to a pulmonologist in [facility] who accepts her medicare.  Medicare gave her a DR. Tawana Scale [phone].  Dr. Chase Caller, is it ok to send this referral?

## 2018-10-01 ENCOUNTER — Telehealth: Payer: Self-pay | Admitting: Internal Medicine

## 2018-10-01 NOTE — Telephone Encounter (Signed)
Spoke with the pt  She states she lives in [place] now and needs pulm referral  She is wanting to see a Dr. Tawana Scale  She has heard great things about him  Please advise if ok to place referral thanks

## 2018-10-02 NOTE — Telephone Encounter (Signed)
That is fine 

## 2018-10-02 NOTE — Telephone Encounter (Signed)
This referral has already been placed I let the patient know this in a detailed message and to call if anything further is needed.

## 2019-10-16 ENCOUNTER — Other Ambulatory Visit: Payer: Self-pay | Admitting: *Deleted

## 2019-10-16 DIAGNOSIS — I712 Thoracic aortic aneurysm, without rupture, unspecified: Secondary | ICD-10-CM

## 2019-10-16 DIAGNOSIS — R918 Other nonspecific abnormal finding of lung field: Secondary | ICD-10-CM

## 2019-10-21 ENCOUNTER — Other Ambulatory Visit: Payer: Self-pay

## 2019-10-21 ENCOUNTER — Ambulatory Visit: Payer: Medicare Other | Admitting: Internal Medicine

## 2019-10-21 ENCOUNTER — Encounter: Payer: Self-pay | Admitting: Internal Medicine

## 2019-10-21 VITALS — BP 118/68 | HR 89 | Ht 62.0 in | Wt 130.2 lb

## 2019-10-21 DIAGNOSIS — G4734 Idiopathic sleep related nonobstructive alveolar hypoventilation: Secondary | ICD-10-CM | POA: Diagnosis not present

## 2019-10-21 DIAGNOSIS — J479 Bronchiectasis, uncomplicated: Secondary | ICD-10-CM

## 2019-10-21 DIAGNOSIS — R0600 Dyspnea, unspecified: Secondary | ICD-10-CM | POA: Diagnosis not present

## 2019-10-21 DIAGNOSIS — R0609 Other forms of dyspnea: Secondary | ICD-10-CM

## 2019-10-21 NOTE — Addendum Note (Signed)
Addended by: Lorretta Harp on: 10/21/2019 12:16 PM   Modules accepted: Orders

## 2019-10-21 NOTE — Progress Notes (Signed)
74 year old female former smoker, only 3 pack year history, and for pulmonary consult July 2017 for DVT (No PE on CT chest x 3 ) treated with Xarelto.   TEST Gina San.  Venous Doppler, 03/04/2016 showed DVT in the short segment of left peroneal vein CTA chest , 7/18 2017 negative for pulmonary embolism, bibasilar sub-solid pulmonary nodules measuring up to 7 mm CTA chest 03/19/2016 negative for pulmonary embolism, stable dilation of the ascending aorta CTA chest 03/30/2016 negative for pulmonary emboli, bilateral ground glass opacities, nodular opacities within the lingula, right middle lobe and right lower lobe VQ scan 03/31/16 bilateral patchy matched ventilation/perfusion defects, intermediate  probability for pulmonary embolus. 2D echo 03/31/16>EF 50-55%, gr 1 DD , PAP 91mHg Pulmonary function test on 03/31/2016 showed a FEV1 at 107%, ratio 77, FVC of 105%, no significant bronchodilator response, DLCO 63%  04/12/2016 post hospital follow-up Patient presents for a post hospital follow-up. It was diagnosed with a DVT on the left 03/04/2016. She was started on Xarelto. She was on Estradiol and she stopped this. CT chest on July 18 was negative for PE. Patient had 3 weeks of progressive shortness of breath She was admitted on 03/30/2016. Patient was admitted August 9 through 04/01/2016 for progressive shortness of breath. CT chest was done that was negative for pulmonary embolism. It did show bilateral groundglass opacities  and nodular opacities within the lingula, right middle lobe and right lower lobe. Was transitioned from XClintontoo Eliquis and hospitalization. Patient did undergo gentle diuresis. She was also treated with Levaquin for questionable atypical infection. A VQ scan was done that showed bilateral patchy matched ventilation and perfusion defects with intermittent probability for pulmonary embolism. She says she was told she had a PE.from this .   A 2-D echo showed a normal EF  at 50-55% with grade 1 diastolic dysfunctions and pulmonary artery pressures at 25 m of mercury. Pulmonary function test showed normal lung function with an FEV1 at 107%. Patient did have a diffusing defect =DLCO 63%.  Autoimmune and CTD workup was neg (HIV, ANCA, GBM, CCP , dsDNA ab , ANA )  Discharge. Patient is feeling some better but still gets winded with walking. No dyspnea at rest .  She denies cough, hemoptysis , abd pain, n/v./d. She does have some soreness along left LE.  Says prior to admit she was very active with yard work , cycling and house work. No dyspnea at all.  Prior to admit no long travel, injury , surgery . No hx of DVT/PE . No FH of DVT/PE.  No hx of cancer.     OV 05/13/2016  Chief Complaint  Patient presents with  . Follow-up    Pt here after CXR and seeing TP on 8.22.17. Pt states her SOB has not improved since also OV. PT c/o DOE, intermittent dry cough, chest tightness when SOB and resolves when she lays down. Pt states she had mild left foot swelling on 9.21.17, this has resolved. Pt denies f/c/s.     [name]74 year old female with very limited remote smoking. In July 2017 she was admitted with left-sided DVT and was started on Xarelto. Sometime after that according to her history she started developing shortness of breath which has persisted to this day despite changes with Xarelto to es.E;liquis. This dyspnea is pervasive. It is moderate to severe in intensity. It is present for exertion and relieved by rest. Class II-III levels of activity makes her dyspneic. There is  no orthopnea paroxysmal nocturnal dyspnea. She had 3 CT scans of the chest in July 2017 that ruled out pulmonary embolism but in August 2017 she did have some new groundglass opacities. August 2017 VQ scan was of intermediate probability. Only function does show reduction in diffusion capacity suggesting the groundglass opacities might be real. She did have autoimmune workup in August 2017  along with complement and vascular this workup that was normal. She presents today with her husband. She is really frustrated by her dyspnea. Chest x-ray personally visualized shows some nonspecific scarring. She is not able to do her household activities.  she has a dry cough of mild intensity for the last 6 months. Cough preceded onset of dyspnea and DVT. At last visit they specifically denied this cough. This no associated wheezing or orthopnea paroxysmal nocturnal dyspnea or chills.    Exhaled nitric oxide testing today in the office 05/13/2016: 23 ppb and normal   Walking desaturation test on 05/13/2016 185 feet x 3 laps:   did NOT desaturate. Rest pulse ox was 100%, final pulse ox was 100% was the final pulse ox w heart rate response was at rest78. HR response  to 94/min at peak exertion.    Noted SCL 70 not done (no dysphagia)  D dimer this visti 0.36 and n  ormal  Dg Chest 2 View  Result Date: 05/13/2016 CLINICAL DATA:  Follow-up pulmonary infiltrates. EXAM: CHEST  2 VIEW COMPARISON:  04/07/2016 and 03/31/2016 FINDINGS: Mild hyperinflation is stable. Stable linear densities in the anterior left lung. No significant airspace disease or consolidation. Heart and mediastinum are within normal limits and stable. Surgical plate in the lower cervical spine. Trachea is midline. No large pleural effusions. Old left seventh rib fracture. IMPRESSION: Stable linear densities in the left lung are probably related to scarring. No acute cardiopulmonary disease. Electronically Signed   By: Markus Daft M.D.   On: 05/13/2016 14:25       OV 06/01/2016  Chief Complaint  Patient presents with  . Follow-up    Pt here after CPST. Pt states her SOB has slightly improved since last OV. Pt c/o dry cough and occassional chest tightness - under breasts after eating dinner in evening. Pt denies chest congestion and f/c/s.     Dyspnea evaluation. Here for review of pulmonary stress test. She underwent  pulmonary stress test 05/26/2016. She gave an excellent effort. She had normal VO2 max and anaerobic threshold. Nevertheless there is a flattening of her O2 pulse curve that is significant. This is associated with tachypnea and dead space ventilation and approaching ventilatory limits. Pulse ox did not go down. In summary this is a combinati    She is reporting weight gain since a deep vein thrombosis. So far pulmonary embolism has never been confirmed. In fact the CT chest in October 2017 only shows groundglass opacities. Echocardiogram shows diastolic dysfunction. In addition after coming off her estrogen which she was taking for many years [in July 2017 after DVT] she is reporting significant hot flashes which are frustrating her. She continues to suffer from dyspnea and is frustrated by this as well.     OV 08/10/2016  Chief Complaint  Patient presents with  . Follow-up    Pt states overall her SOB has improved but has bad days every now and then. Pt c/o more SOB today and occassional dry cough with some  frank blood - size of penny. Pt also states she has chest tightness when she is SOB -  this resolves with rest.     Follow-up dyspnea: We finally determined that this dyspnea was unlikely due to pulmonary embolism. Was related to diastolic dysfunction and pulmonary groundglass infiltrates sustained in July 2017 at the time of deep vein thrombosis diagnosis. Last visit October 2017 I started Lasix therapy. Since then with the passage of time and Lasix therapy she has improved with her dyspnea. She still does have mild dyspnea on exertion. She has not attended pulmonary rehabilitation. She does not want to go to pulmonary habitation due to logistical issues and time issues. She and her husband said they will start exercising on a road bike for an exercise bike by themselves gradually.  In terms of deep vein thrombosis related to estrogen intake.. She continues to be on anticoagulation since July  2017. She's completed 5 months of therapy. Interim d-dimer  normal. She has no bleeding complications. She wants to stop anticoagulation.  OV 10/25/2016  Chief Complaint  Patient presents with  . Follow-up    Pt here after HRCT. pt states her breathing is unchanged since last OV. Pt denies cough and CP/tightness.     Follow-up dyspnea: At last visit she told me that with Lasix therapy that her dyspnea improved. Today she tells me that she has now plateaued in terms of improvement. She still has dyspnea on exertion relieved by rest. The Lasix is helping but without the Lasix it is worse. It at the same time she feels she has not returned to her baseline pre-summer 2017. High-resolution CT scan of the chest done today and personally visualized shows the following abnormalities described suggestive of MAI infection  In terms of the left lower extremity DVT: In December 2017 we stopped anticoag based on normal D-dimer. She says since summer 2017 continues with daily left pain that sarts in ankle and spreads up to popliteal area along the venous course. On February 2018 on the 21st she had Doppler ultrasound of the left lower extremity that did not show any DVT. She also had chemistries that are normal. She takes Naprosyn once daily for this. She says all this is new since the DVT. She is frustrated by the pain. She says that primary care has deferred this issue to me also notices associated edema. She is wondering about vascular insufficiency and is requesting whether she should be referred to a vascular evaluation  Ct Chest High Resolution  Result Date: 10/25/2016 CLINICAL DATA:  Dyspnea and wheezing. Former smoker. History of pulmonary embolism and DVT. Follow-up abnormal chest CT. EXAM: CT CHEST WITHOUT CONTRAST TECHNIQUE: Multidetector CT imaging of the chest was performed following the standard protocol without intravenous contrast. High resolution imaging of the lungs, as well as inspiratory and  expiratory imaging, was performed. COMPARISON:  03/30/2016 chest CT angiogram. 07/08/2016 chest radiograph. FINDINGS: Cardiovascular: Normal heart size. No significant pericardial fluid/thickening. Left anterior descending coronary atherosclerosis. Atherosclerotic thoracic aorta with stable mild ectasia of the ascending thoracic aorta (maximum diameter 3.9 cm). Normal caliber pulmonary arteries. Mediastinum/Nodes: Status post left hemithyroidectomy. No discrete right thyroid lobe nodules. Unremarkable esophagus. No pathologically enlarged axillary, mediastinal or gross hilar lymph nodes, noting limited sensitivity for the detection of hilar adenopathy on this noncontrast study. Lungs/Pleura: No pneumothorax. No pleural effusion. There is mild-to-moderate cylindrical and varicoid bronchiectasis extensively distributed throughout both lungs, most prominent in the basilar right upper lobe, right middle lobe and lingula. There is extensive patchy tree-in-bud and centrilobular nodularity throughout both lungs at the areas of bronchiectasis, which appears overall  mildly worsened since 03/30/2016, including a new dominant nodular 13 x 11 mm opacity in the medial dependent right lower lobe (series 3/ image 76). Bandlike subpleural opacities at the areas of bronchiectasis in the basilar right upper lobe, medial segment right middle lobe and lingula are stable and compatible with postinfectious scars. Otherwise no acute consolidative airspace disease or lung masses. Mild centrilobular emphysema. Mild patchy air trapping in both lungs on the expiration sequence. No frank honeycombing. Upper abdomen: Partially visualized left parapelvic renal cysts. Musculoskeletal: No aggressive appearing focal osseous lesions. Partially visualized surgical hardware from ACDF in the lower cervical spine. Stable T9 vertebral hemangioma. Mild thoracic spondylosis. IMPRESSION: 1. 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 Findings have mildly worsened in the interval since 03/30/2016, including a new dominant 13 mm nodular opacity in the right lower lobe. Recommend initial follow-up post treatment high-resolution chest CT in 6 months. 2. Mild patchy air trapping in both lungs, suggesting small airways disease. 3. Aortic atherosclerosis.  One vessel coronary atherosclerosis. Electronically Signed   By: Ilona Sorrel M.D.   On: 10/25/2016 10:07    OV 12/20/2016  Chief Complaint  Patient presents with  . Follow-up    Pt here after bronch. Pt states her breathing is unchanged since last OV. Pt c/o dry cough and chest heaviness. Pt denies f/c/s and CP/tightness.    Follow-up pulmonary infiltrates not otherwise specified: She underwent bronchoscopy with bronchoalveolar lavage of July 2018 which showed 57% monocytes and 26% neutrophils. Cultures are negative except for colonizing contaminant mold ; low colony count. Last CT scan was March 2018  Follow-up dyspnea: She continues to have dyspnea. She continues to insist that is all after the DVT. There are no new issues. She is planning to exercise [place]    03/20/2017 acute extended ov/Wert re: sob at rest and dry cough x one year  Chief Complaint  Patient presents with  . Acute Visit    Increased SOB, no energy, wheezing and non prod cough x 6 wks. Her cough is non prod, and esp worse at night, sometimes wakes her up.  No appetite.    after last ov with MR 12/20/16 (no change rx) Breathing was better for two weeks only if sitting but doe  x 50 ft  And mailbox and stops 200 ft flat ever since dvt "PE"  Since then sob at rest unless sleeping p diazepam and  "sleeping pill" and tramadol not aware of noct symptoms but after wakes each day  Always stops due to wob just walking to kitchen  Cough worse/ very harsh dry day > noct  PC rec prozac higher dose but no better so back to 10 mg daily  No better on saba hfa/ neb   No obvious patterns in day to day or daytime variability or assoc excess/ purulent sputum or mucus plugs or hemoptysis or cp or chest tightness,  or overt sinus or hb symptoms. No unusual exp hx or h/o childhood pna/ asthma or knowledge of premature birth.    OV 03/28/2017  Chief Complaint  Patient presents with  . Follow-up    Pt saw MW on 7.30.2018 for an acute visit. Pt states her SOB has not improved since last OV. Pt states she still  has a dry cough. Pt denies CP/tightness, f/c/s, chest congestion.    Gina Lyons is had long-standing shortness of breath that remained somewhat unexplained. The onset was in summer 2017 at the time of admission for DVT. There is no clear-cut evidence of pulmonary embolism. She did have some groundglass opacities at that time. Initially diuresis helped but subsequently did not. Over time her CT scan of the chest to change to the extent that in spring 2018 dose addition she had MAI infection. I did a bronchoscopy in April 2018 and then saw her for follow-up in May 2018. At this point in time the cultures were negative but subsequently in June 2018 the cultures returned showing MAI infection. I had a phone conversation with her and we opted to follow because she was feeling better. Now she presents for follow-up since her visit in May 2018 she has had worsening shortness of breath. In the interim she even saw my colleague Dr. Legrand Como wert for worsening shortness of breath. Walking desaturation test 185 feet 3 laps on room air with a full head probe: d walk only one lap and got very short of breath. She was short of breath for the remaining 510 minutes of a conversation in the room. She did not desaturate  below 100% and she did not get tachycardic. She is also reporting severe associated significant fatigue. Dyspnea and symptoms are worse in the last few months. During household work is extremely tiring. She says the fatigue has been worked up extensively by her primary care physician without any obvious cause  Last d-dimer September 2017 was normal. normal. She's currently not on anticoagulation  Echocardiogram August 2017 with ejection fraction 55% with normal nuclear medicine cardiac stress test November 2017  Normal allergy tests July 2018  Chest x-ray 03/20/2017: Personally visualized. Chronic bronchitic changes. Radiology second running high resolution CT chest.  Results for KAYCIE, PEGUES (MRN [id number]) as of 03/28/2017 09:26  Ref. Range 11/28/2016 15:35  ACID FAST SMEAR (AFB) Unknown Rpt  ACID FAST CULTURE WITH REFLEXED SENSITIVITIES Unknown Rpt (A)  Acid Fast Culture Unknown Positive (A)  Organism ID Unknown Comment (A)  MAC SUSCEPTIBILITY BROTH   Unknown Rpt (A)  AFB ORGANISM ID BY DNA PROBE Unknown Rpt (A)   IMPRESSION: 1. 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 Findings have mildly worsened in the interval since 03/30/2016, including a new dominant 13 mm nodular opacity in the right lower lobe. Recommend initial follow-up post treatment high-resolution chest CT in 6 months. 2. Mild patchy air trapping in both lungs, suggesting small airways disease. 3. Aortic atherosclerosis.  One vessel coronary atherosclerosis.   Electronically Signed   By: Ilona Sorrel M.D.   On: 10/25/2016 10:07   OV 05/11/2017  Chief Complaint  Patient presents with  . Follow-up    Pt  states that she still has tingling and pain in her ankle from the blood clot. Still becomes SOB on exertion, but is doing better. Does have a lot of dry coughing. Denies any CP.   Follow-up dyspnea that is now deemed due to bronchiectasis associated with MAI infection. At last visit d-dimer was high we did a CT angiogram ruled out pulmonary embolism but did show bronchiectasis associated with MAI infection. I referred her to infectious diseases Dr.: She is  now on MAI treatment. This is causing significant nausea and fatigue. The trying different protocols and it seems to be helping. With the MAI treatment her dyspnea might be better but her cough is definitely not worse. In association she also has other nonspecific different cans of symptoms. She did complain that last night her left cough is a bit swollen but rate is better. This no warmth. She does not want any duplex ultrasound for this at this point. Review of the notes indicate that have not tried inhaler therapy for her in the past. Of note she is finished a flu shot for this season   OV 06/19/2017  Chief Complaint  Patient presents with  . Follow-up    Pt still complains of both legs hurting, if anything pain has gotten worse. Also has complaints of dry cough, SOB on exertion and occ. CP.    Follow-up dyspnea in the setting of MAI infection  She continues on MAI treatment. She is supposed to come back in the early part of 2019 for some reason she had an appointment scheduled today.She says that Spiriva that I gave last emesis sample helped her dyspnea but then it gave her the shakes.  Willing to try another inhaler for dyspnea from another class.her main issue today is that she continues to have chron lower extremity pain on the left side. This is chronic. She needs to talk to primary care physician about this   OV 08/03/2017  Chief Complaint  Patient presents with  . Follow-up    Pt stated that she has a lot of questions for MR and  wanted an appt. Pt has been about the same since last visit. Has been having a lot of side effects from some meds she has been put on.    74 year old female for dyspnea in the setting of MAI infection  I just saw her June 19, 2017.  This visit is an acute visit.  She made this visit specifically because of concerns of drug intolerance with her MAI treatment.  She is here with her husband.  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  She is on triple drug regimen of ethambutol rifampin and azithromycin.  This gives a significant nausea and fatigue.  They did see infectious diseases Dr. Linus Salmons on July 11, 2017.  Repeat sputum culture was done and this did not show any acid-fast bacilli.  Culture is still pending although no growth so far.  Conversation was held with the patient and patient agreed to start inhaled tobramycin.  She does have a history of tinnitus which was noted by infectious diseases and audiology appointment has been set up but patient is now concerned about all the significant side effects of inhaled tobramycin that include ototoxicity.  She is frustrated about adding a fourth medication to a pre-existing regimen.  She says she does not understand the rationale.  She is frustrated that all aspects of her treatment of benefits, risks and implications of not been communicated with her although the documentation is otherwise.  She is asking for blood work and chest x-ray and guidance from me about decision making with her treatment.  I did speak with infectious diseases Dr. Linus Salmons who is supportive of goal-directed therapy.  Patient is a whether the disease has potential to progress without Mycobacterium avium complex treatment.  On the other hand she has been explained to her that the fatigue and side effects of treatment can sometimes be overwhelming and sometimes patients  do opt for supportive care.  She had a husband wondering about a 2-week drug  holiday of a 3 baseline drugs.  At this point she is not inclined to start inhaled tobramycin at all.  There are no other issues.   OV 11/02/2017  Chief Complaint  Patient presents with  . Follow-up    Pt is still coughing in the mornings and does still become SOB with exertion and occ. chest pressure.    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  She subsequently saw Dr. Scharlene Gloss in January 2019 and has completely stopped her MAI treatment.  Sputum culture at that time was negative for growth [initial positive growth was April 2018].  She says her energy levels are much better and her fatigue is resolved after stopping MAI treatment.  Overall she feels better except early morning she has cough that lasts moderate amount of time and severity.  Once she starts a Spiriva she gets better.  She does have postnasal drip for which she only takes in [place] nasal spray.  She has a Flonase at home but she is not taking it.  She is a little bit frustrated with the cough but nevertheless is not overwhelming.  She prefers conservative line of treatment.   OV 12/15/2017  Chief Complaint  Patient presents with  . Follow-up    Pt has had increased fatigue and breathing has become worse and states it has been worse for several weeks. Pt's husband states all pt wants to do is sleep. Pt also has some mild coughing.    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  This is an acute visit.  According to the husband and the patient for the last 3 weeks has increased fatigue.  She is tired all the time even at rest.  When she exerts herself the fatigue is worse.  She stopped after walking from the house to the mailbox because of fatigue.  The fatigue is associated with shortness of breath but only the shortness  of breath is present on exertion and relieved by rest.  There is no associated chest pain orthopnea or proximal nocturnal dyspnea or worsening edema or hemoptysis or chest pain.  However there is some increased wheezing.  Also at night when she is sleeping she feels rattling in her chest.  There is no respiratory infection fever.  Just back in September 74 year old with pulmonary embolism.  The husband feels that his blood pressure is low because of the systolic being 673 but the mean arterial pressure is adequate.  She is currently not on MAI treatment due to intolerance.  Walking desaturation test 180 feet x 3 laps on room air with a forehead probe.  She started off at 100% and stopped when she finished one lap because of shortness of breath.  She only walk very slowly.  Pulse ox when she stopped was 96%.  It was a 4%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

## 2019-10-21 NOTE — Patient Instructions (Addendum)
ICD-10-CM   1. Bronchiectasis without complication ([place])  A999333   2. Dyspnea on exertion  R06.00   3. Nocturnal hypoxemia  G47.34     Welcome back  Glad overall stable but need to restage given the worsening shortness of breath and new interim finding of nocturnal hypoxemia  Plan  - do ONO room air - will call with results  - keep CT chest wo contrast on 10/30/2019 for Dr Cyndia Bent - will see if they can do it as  A HRCT protocol  - do spirometry pre and poost BD and dlco - first avaoil - continue symbicort  Followup  - April 2021 for face to face or video visit with Dr Chase Caller but after completing above

## 2019-10-24 DIAGNOSIS — Z961 Presence of intraocular lens: Secondary | ICD-10-CM | POA: Diagnosis not present

## 2019-10-24 DIAGNOSIS — H26492 Other secondary cataract, left eye: Secondary | ICD-10-CM | POA: Diagnosis not present

## 2019-10-24 DIAGNOSIS — H52203 Unspecified astigmatism, bilateral: Secondary | ICD-10-CM | POA: Diagnosis not present

## 2019-10-24 DIAGNOSIS — H524 Presbyopia: Secondary | ICD-10-CM | POA: Diagnosis not present

## 2019-10-30 ENCOUNTER — Other Ambulatory Visit: Payer: Self-pay

## 2019-10-30 ENCOUNTER — Encounter: Payer: Self-pay | Admitting: Surgery

## 2019-10-30 ENCOUNTER — Ambulatory Visit: Payer: Medicare Other | Admitting: Surgery

## 2019-10-30 ENCOUNTER — Ambulatory Visit
Admission: RE | Admit: 2019-10-30 | Discharge: 2019-10-30 | Disposition: A | Discharge status: Home / Self Care | Payer: Medicare Other | Source: Ambulatory Visit | Attending: Surgery | Admitting: Surgery

## 2019-10-30 VITALS — BP 140/85 | HR 80 | Resp 20 | Ht 62.0 in | Wt 131.0 lb

## 2019-10-30 DIAGNOSIS — I712 Thoracic aortic aneurysm, without rupture, unspecified: Secondary | ICD-10-CM

## 2019-10-30 DIAGNOSIS — R918 Other nonspecific abnormal finding of lung field: Secondary | ICD-10-CM | POA: Diagnosis not present

## 2019-10-30 NOTE — Progress Notes (Signed)
HPI:  The patient is a 74 year old woman who returns for follow-up of a 4.0 cm fusiform ascending aortic aneurysm.  I last saw her on 05/16/2018 at which time the maximum diameter of the ascending aorta was measured at 3.9 cm.  She denies any chest or back pain.  She recently saw Dr. Chase Caller for her chronic shortness of breath which has been an issue for years.  Current Outpatient Medications  Medication Sig Dispense Refill  . amLODipine (NORVASC) 5 MG tablet Take 5 mg by mouth at bedtime.    . Biotin 5 MG CAPS Take 5 mg by mouth daily.    . diazepam (VALIUM) 10 MG tablet Take 10 mg by mouth at bedtime.     Marland Kitchen ethambutol (MYAMBUTOL) 400 MG tablet Take 2 tablets (800 mg total) by mouth daily. 60 tablet 5  . fexofenadine (ALLEGRA) 180 MG tablet Take 180 mg by mouth daily.    Marland Kitchen levothyroxine (SYNTHROID, LEVOTHROID) 112 MCG tablet Take 112 mcg by mouth daily before breakfast. Brand name only**    . Mometasone Furoate (ASMANEX HFA) 100 MCG/ACT AERO Inhale 2 puffs into the lungs 2 (two) times daily. 1 Inhaler 0  . sodium chloride (OCEAN) 0.65 % SOLN nasal spray Place 1 spray into both nostrils at bedtime as needed for congestion.    . Tiotropium Bromide Monohydrate (SPIRIVA RESPIMAT) 2.5 MCG/ACT AERS Inhale 2 puffs into the lungs daily.     No current facility-administered medications for this visit.     Physical Exam: BP 140/85   Pulse 80   Resp 20   Ht 5\' 2"  (1.575 m)   Wt 131 lb (59.4 kg)   SpO2 96% Comment: RA  BMI 23.96 kg/m  She looks well. Cardiac exam shows a regular rate and rhythm with normal heart sounds.  There is no murmur. Lungs are clear.  Diagnostic Tests:  CLINICAL DATA:  Shortness of breath. Thoracic aortic aneurysm. Pulmonary nodules.  EXAM: CT CHEST WITHOUT CONTRAST  TECHNIQUE: Multidetector CT imaging of the chest was performed following the standard protocol without IV contrast.  COMPARISON:  May 16, 2018.  FINDINGS: Cardiovascular: 4  cm ascending thoracic aortic aneurysm is noted. Transverse aortic arch measures 3.3 cm. Descending thoracic aorta measures 2.8 cm. Atherosclerosis of thoracic aorta is noted. Dissection cannot be excluded due to the lack of intravenous contrast. Normal cardiac size. No pericardial effusion.  Mediastinum/Nodes: No enlarged mediastinal or axillary lymph nodes. Thyroid gland, trachea, and esophagus demonstrate no significant findings.  Lungs/Pleura: No pneumothorax or pleural effusion is noted. 5 mm nodule is noted anteriorly in the left upper lobe best seen on image number 43 of series 8. This is not clearly identified on the prior exam. Probable stable 4 mm nodule is seen in lingular segment of left upper lobe best seen on image number 77 of series 8. New 3 mm subpleural nodule is noted anteriorly in the right upper lobe best seen on image number 63 of series 8. Stable calcified granuloma is noted posteriorly in the right lower lobe. Subpleural nodule measuring 5 x 3 mm is noted medially in the right lower lobe best seen on image number 70 of series 8 which is decreased compared to prior exam.  Upper Abdomen: No acute abnormality.  Musculoskeletal: No chest wall mass or suspicious bone lesions identified.  IMPRESSION: 1. 4 cm ascending thoracic aortic aneurysm is noted. Recommend annual imaging followup by CTA or MRA. This recommendation follows 2010 ACCF/AHA/AATS/ACR/ASA/SCA/SCAI/SIR/STS/SVM Guidelines for the Diagnosis  and Management of Patients with Thoracic Aortic Disease. Circulation. 2010; [id number]. Aortic aneurysm NOS (ICD10-I71.9). 2. Probable stable 4 mm nodule is seen in lingular segment of left upper lobe. 3. New 3 mm subpleural nodule is noted anteriorly in the right upper lobe as well as probably new 5 mm nodule in the left upper lobe. Follow-up unenhanced chest CT in 12 months is recommended to ensure stability or resolution. 4. 5 x 3 mm subpleural  nodule is noted medially in the right lower lobe which is decreased compared to prior exam.  Aortic Atherosclerosis (ICD10-I70.0).   Electronically Signed   By: Marijo Conception M.D.   On: 10/30/2019 14:00  Impression:  The ascending aortic aneurysm is stable at 4.0 cm.  She has multiple tiny bilateral pulmonary nodules.  There is a new 3 mm subpleural nodule in the right upper lobe anteriorly as well as a new 5 mm nodule in the left upper lobe.  The other nodules are unchanged or smaller.  I suspect these new nodules are probably benign but will be followed up on subsequent CT scans.  Her aortic aneurysm is still well below the surgical threshold of 5.5 cm and is stable in size.  I reviewed the CT scan images with her and her husband and answered their questions.  I discussed the importance of good blood pressure control and preventing further enlargement and acute aortic dissection.  Plan:  I will see her back in 1 year with a CT scan of the chest without contrast to follow-up on her ascending aortic aneurysm and new small bilateral pulmonary nodules.  I spent 20 minutes performing this established patient evaluation and > 50% of this time was spent face to face counseling and coordinating the care of this patient's aortic aneurysm and new small bilateral pulmonary nodules.    Gaye Pollack, MD [facility] [phone]

## 2019-11-06 DIAGNOSIS — H26492 Other secondary cataract, left eye: Secondary | ICD-10-CM | POA: Diagnosis not present

## 2019-11-14 DIAGNOSIS — J479 Bronchiectasis, uncomplicated: Secondary | ICD-10-CM | POA: Diagnosis not present

## 2019-12-04 ENCOUNTER — Encounter: Payer: Self-pay | Admitting: Internal Medicine

## 2019-12-04 ENCOUNTER — Ambulatory Visit: Payer: Medicare Other | Admitting: Internal Medicine

## 2019-12-04 ENCOUNTER — Other Ambulatory Visit: Payer: Self-pay

## 2019-12-04 ENCOUNTER — Telehealth: Payer: Self-pay | Admitting: Internal Medicine

## 2019-12-04 ENCOUNTER — Other Ambulatory Visit: Payer: Self-pay | Admitting: *Deleted

## 2019-12-04 VITALS — BP 128/74 | HR 68 | Ht 62.0 in | Wt 131.0 lb

## 2019-12-04 DIAGNOSIS — Z8619 Personal history of other infectious and parasitic diseases: Secondary | ICD-10-CM

## 2019-12-04 DIAGNOSIS — R0600 Dyspnea, unspecified: Secondary | ICD-10-CM

## 2019-12-04 DIAGNOSIS — R918 Other nonspecific abnormal finding of lung field: Secondary | ICD-10-CM | POA: Diagnosis not present

## 2019-12-04 DIAGNOSIS — J479 Bronchiectasis, uncomplicated: Secondary | ICD-10-CM

## 2019-12-04 DIAGNOSIS — R0609 Other forms of dyspnea: Secondary | ICD-10-CM

## 2019-12-04 NOTE — Patient Instructions (Addendum)
ICD-10-CM   1. Bronchiectasis without complication ([place])  A999333   2. History of MAI infection  Z86.19   3. Dyspnea on exertion  R06.00   4. Multiple pulmonary nodules determined by computed tomography of lung  R91.8    Workup not complete  We have agreed to consider MRI as a possible contributory element to your shortness of breath.  Overall CT similar to 2019  - there are some small new nodules - but I do not think this is signifcant because one other nodule is smaller  Respect refusal to reconsider MAI treatment  Plan  - complete PFT May 2021 - check ONO result after last visit or redo this  - do echo -We will need follow-up CT scan of the chest in 1 year without contrast  Followup - later in may 2021 with app to discuss results - tele visit 15 minutes fine  - if non-contributory consider rechallenge CPST bike test (last done in 2017)

## 2019-12-04 NOTE — Telephone Encounter (Signed)
Gina Lyons = Her overnight oxygen study on November 08, 2019.  The results were reviewed after her office visit today.  Time spent less than or equal to 88% was at 211 minutes.  Lowest pulse ox was 75%.  Basal pulse ox was 87%.  Awake pulse ox was 92%.  She had 18 events per hour as her ODI  Plan  -Start 2 L of oxygen at night

## 2019-12-04 NOTE — Telephone Encounter (Signed)
Called and spoke with pt letting her know the results of the ono and stated that we will have her begin O2 at bedtime. Pt verbalized understanding. We had received a fax from [place] with order info already filled out. This has been signed by MR and has been faxed.nothing further needed.

## 2019-12-04 NOTE — Progress Notes (Signed)
74 year old female former smoker, only 3 pack year history, and for pulmonary consult July 2017 for DVT (No PE on CT chest x 3 ) treated with Xarelto.   TEST Gina Lyons.  Venous Doppler, 03/04/2016 showed DVT in the short segment of left peroneal vein CTA chest , 7/18 2017 negative for pulmonary embolism, bibasilar sub-solid pulmonary nodules measuring up to 7 mm CTA chest 03/19/2016 negative for pulmonary embolism, stable dilation of the ascending aorta CTA chest 03/30/2016 negative for pulmonary emboli, bilateral ground glass opacities, nodular opacities within the lingula, right middle lobe and right lower lobe VQ scan 03/31/16 bilateral patchy matched ventilation/perfusion defects, intermediate  probability for pulmonary embolus. 2D echo 03/31/16>EF 50-55%, gr 1 DD , PAP 40mHg Pulmonary function test on 03/31/2016 showed a FEV1 at 107%, ratio 77, FVC of 105%, no significant bronchodilator response, DLCO 63%  04/12/2016 post hospital follow-up Patient presents for a post hospital follow-up. It was diagnosed with a DVT on the left 03/04/2016. She was started on Xarelto. She was on Estradiol and she stopped this. CT chest on July 18 was negative for PE. Patient had 3 weeks of progressive shortness of breath She was admitted on 03/30/2016. Patient was admitted August 9 through 04/01/2016 for progressive shortness of breath. CT chest was done that was negative for pulmonary embolism. It did show bilateral groundglass opacities  and nodular opacities within the lingula, right middle lobe and right lower lobe. Was transitioned from XRamseytoo Eliquis and hospitalization. Patient did undergo gentle diuresis. She was also treated with Levaquin for questionable atypical infection. A VQ scan was done that showed bilateral patchy matched ventilation and perfusion defects with intermittent probability for pulmonary embolism. She says she was told she had a PE.from this .   A 2-D echo showed a normal EF at  50-55% with grade 1 diastolic dysfunctions and pulmonary artery pressures at 25 m of mercury. Pulmonary function test showed normal lung function with an FEV1 at 107%. Patient did have a diffusing defect =DLCO 63%.  Autoimmune and CTD workup was neg (HIV, ANCA, GBM, CCP , dsDNA ab , ANA )  Discharge. Patient is feeling some better but still gets winded with walking. No dyspnea at rest .  She denies cough, hemoptysis , abd pain, n/v./d. She does have some soreness along left LE.  Says prior to admit she was very active with yard work , cycling and house work. No dyspnea at all.  Prior to admit no long travel, injury , surgery . No hx of DVT/PE . No FH of DVT/PE.  No hx of cancer.     OV 05/13/2016  Chief Complaint  Patient presents with  . Follow-up    Pt here after CXR and seeing TP on 8.22.17. Pt states her SOB has not improved since also OV. PT c/o DOE, intermittent dry cough, chest tightness when SOB and resolves when she lays down. Pt states she had mild left foot swelling on 9.21.17, this has resolved. Pt denies f/c/s.     [name]89year old female with very limited remote smoking. In July 2017 she was admitted with left-sided DVT and was started on Xarelto. Sometime after that according to her history she started developing shortness of breath which has persisted to this day despite changes with Xarelto to es.E;liquis. This dyspnea is pervasive. It is moderate to severe in intensity. It is present for exertion and relieved by rest. Class II-III levels of activity makes her dyspneic. There is no  orthopnea paroxysmal nocturnal dyspnea. She had 3 CT scans of the chest in July 2017 that ruled out pulmonary embolism but in August 2017 she did have some new groundglass opacities. August 2017 VQ scan was of intermediate probability. Only function does show reduction in diffusion capacity suggesting the groundglass opacities might be real. She did have autoimmune workup in August 2017 along  with complement and vascular this workup that was normal. She presents today with her husband. She is really frustrated by her dyspnea. Chest x-ray personally visualized shows some nonspecific scarring. She is not able to do her household activities.  she has a dry cough of mild intensity for the last 6 months. Cough preceded onset of dyspnea and DVT. At last visit they specifically denied this cough. This no associated wheezing or orthopnea paroxysmal nocturnal dyspnea or chills.    Exhaled nitric oxide testing today in the office 05/13/2016: 23 ppb and normal   Walking desaturation test on 05/13/2016 185 feet x 3 laps:   did NOT desaturate. Rest pulse ox was 100%, final pulse ox was 100% was the final pulse ox w heart rate response was at rest78. HR response  to 94/min at peak exertion.    Noted SCL 70 not done (no dysphagia)  D dimer this visti 0.36 and n  ormal  Dg Chest 2 View  Result Date: 05/13/2016 CLINICAL DATA:  Follow-up pulmonary infiltrates. EXAM: CHEST  2 VIEW COMPARISON:  04/07/2016 and 03/31/2016 FINDINGS: Mild hyperinflation is stable. Stable linear densities in the anterior left lung. No significant airspace disease or consolidation. Heart and mediastinum are within normal limits and stable. Surgical plate in the lower cervical spine. Trachea is midline. No large pleural effusions. Old left seventh rib fracture. IMPRESSION: Stable linear densities in the left lung are probably related to scarring. No acute cardiopulmonary disease. Electronically Signed   By: Markus Daft M.D.   On: 05/13/2016 14:25       OV 06/01/2016  Chief Complaint  Patient presents with  . Follow-up    Pt here after CPST. Pt states her SOB has slightly improved since last OV. Pt c/o dry cough and occassional chest tightness - under breasts after eating dinner in evening. Pt denies chest congestion and f/c/s.     Dyspnea evaluation. Here for review of pulmonary stress test. She underwent pulmonary  stress test 05/26/2016. She gave an excellent effort. She had normal VO2 max and anaerobic threshold. Nevertheless there is a flattening of her O2 pulse curve that is significant. This is associated with tachypnea and dead space ventilation and approaching ventilatory limits. Pulse ox did not go down. In summary this is a combinati    She is reporting weight gain since a deep vein thrombosis. So far pulmonary embolism has never been confirmed. In fact the CT chest in October 2017 only shows groundglass opacities. Echocardiogram shows diastolic dysfunction. In addition after coming off her estrogen which she was taking for many years [in July 2017 after DVT] she is reporting significant hot flashes which are frustrating her. She continues to suffer from dyspnea and is frustrated by this as well.     OV 08/10/2016  Chief Complaint  Patient presents with  . Follow-up    Pt states overall her SOB has improved but has bad days every now and then. Pt c/o more SOB today and occassional dry cough with some  frank blood - size of penny. Pt also states she has chest tightness when she is SOB -  this resolves with rest.     Follow-up dyspnea: We finally determined that this dyspnea was unlikely due to pulmonary embolism. Was related to diastolic dysfunction and pulmonary groundglass infiltrates sustained in July 2017 at the time of deep vein thrombosis diagnosis. Last visit October 2017 I started Lasix therapy. Since then with the passage of time and Lasix therapy she has improved with her dyspnea. She still does have mild dyspnea on exertion. She has not attended pulmonary rehabilitation. She does not want to go to pulmonary habitation due to logistical issues and time issues. She and her husband said they will start exercising on a road bike for an exercise bike by themselves gradually.  In terms of deep vein thrombosis related to estrogen intake.. She continues to be on anticoagulation since July 2017.  She's completed 5 months of therapy. Interim d-dimer  normal. She has no bleeding complications. She wants to stop anticoagulation.  OV 10/25/2016  Chief Complaint  Patient presents with  . Follow-up    Pt here after HRCT. pt states her breathing is unchanged since last OV. Pt denies cough and CP/tightness.     Follow-up dyspnea: At last visit she told me that with Lasix therapy that her dyspnea improved. Today she tells me that she has now plateaued in terms of improvement. She still has dyspnea on exertion relieved by rest. The Lasix is helping but without the Lasix it is worse. It at the same time she feels she has not returned to her baseline pre-summer 2017. High-resolution CT scan of the chest done today and personally visualized shows the following abnormalities described suggestive of MAI infection  In terms of the left lower extremity DVT: In December 2017 we stopped anticoag based on normal D-dimer. She says since summer 2017 continues with daily left pain that sarts in ankle and spreads up to popliteal area along the venous course. On February 2018 on the 21st she had Doppler ultrasound of the left lower extremity that did not show any DVT. She also had chemistries that are normal. She takes Naprosyn once daily for this. She says all this is new since the DVT. She is frustrated by the pain. She says that primary care has deferred this issue to me also notices associated edema. She is wondering about vascular insufficiency and is requesting whether she should be referred to a vascular evaluation  Ct Chest High Resolution  Result Date: 10/25/2016 CLINICAL DATA:  Dyspnea and wheezing. Former smoker. History of pulmonary embolism and DVT. Follow-up abnormal chest CT. EXAM: CT CHEST WITHOUT CONTRAST TECHNIQUE: Multidetector CT imaging of the chest was performed following the standard protocol without intravenous contrast. High resolution imaging of the lungs, as well as inspiratory and expiratory  imaging, was performed. COMPARISON:  03/30/2016 chest CT angiogram. 07/08/2016 chest radiograph. FINDINGS: Cardiovascular: Normal heart size. No significant pericardial fluid/thickening. Left anterior descending coronary atherosclerosis. Atherosclerotic thoracic aorta with stable mild ectasia of the ascending thoracic aorta (maximum diameter 3.9 cm). Normal caliber pulmonary arteries. Mediastinum/Nodes: Status post left hemithyroidectomy. No discrete right thyroid lobe nodules. Unremarkable esophagus. No pathologically enlarged axillary, mediastinal or gross hilar lymph nodes, noting limited sensitivity for the detection of hilar adenopathy on this noncontrast study. Lungs/Pleura: No pneumothorax. No pleural effusion. There is mild-to-moderate cylindrical and varicoid bronchiectasis extensively distributed throughout both lungs, most prominent in the basilar right upper lobe, right middle lobe and lingula. There is extensive patchy tree-in-bud and centrilobular nodularity throughout both lungs at the areas of bronchiectasis, which appears overall  mildly worsened since 03/30/2016, including a new dominant nodular 13 x 11 mm opacity in the medial dependent right lower lobe (series 3/ image 76). Bandlike subpleural opacities at the areas of bronchiectasis in the basilar right upper lobe, medial segment right middle lobe and lingula are stable and compatible with postinfectious scars. Otherwise no acute consolidative airspace disease or lung masses. Mild centrilobular emphysema. Mild patchy air trapping in both lungs on the expiration sequence. No frank honeycombing. Upper abdomen: Partially visualized left parapelvic renal cysts. Musculoskeletal: No aggressive appearing focal osseous lesions. Partially visualized surgical hardware from ACDF in the lower cervical spine. Stable T9 vertebral hemangioma. Mild thoracic spondylosis. IMPRESSION: 1. 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 Findings have mildly worsened in the interval since 03/30/2016, including a new dominant 13 mm nodular opacity in the right lower lobe. Recommend initial follow-up post treatment high-resolution chest CT in 6 months. 2. Mild patchy air trapping in both lungs, suggesting small airways disease. 3. Aortic atherosclerosis.  One vessel coronary atherosclerosis. Electronically Signed   By: Ilona Sorrel M.D.   On: 10/25/2016 10:07    OV 12/20/2016  Chief Complaint  Patient presents with  . Follow-up    Pt here after bronch. Pt states her breathing is unchanged since last OV. Pt c/o dry cough and chest heaviness. Pt denies f/c/s and CP/tightness.    Follow-up pulmonary infiltrates not otherwise specified: She underwent bronchoscopy with bronchoalveolar lavage of July 2018 which showed 57% monocytes and 26% neutrophils. Cultures are negative except for colonizing contaminant mold ; low colony count. Last CT scan was March 2018  Follow-up dyspnea: She continues to have dyspnea. She continues to insist that is all after the DVT. There are no new issues. She is planning to exercise [place]    03/20/2017 acute extended ov/Wert re: sob at rest and dry cough x one year  Chief Complaint  Patient presents with  . Acute Visit    Increased SOB, no energy, wheezing and non prod cough x 6 wks. Her cough is non prod, and esp worse at night, sometimes wakes her up.  No appetite.    after last ov with MR 12/20/16 (no change rx) Breathing was better for two weeks only if sitting but doe  x 50 ft  And mailbox and stops 200 ft flat ever since dvt "PE"  Since then sob at rest unless sleeping p diazepam and "sleeping pill" and  tramadol not aware of noct symptoms but after wakes each day  Always stops due to wob just walking to kitchen  Cough worse/ very harsh dry day > noct  PC rec prozac higher dose but no better so back to 10 mg daily  No better on saba hfa/ neb   No obvious patterns in day to day or daytime variability or assoc excess/ purulent sputum or mucus plugs or hemoptysis or cp or chest tightness,  or overt sinus or hb symptoms. No unusual exp hx or h/o childhood pna/ asthma or knowledge of premature birth.    OV 03/28/2017  Chief Complaint  Patient presents with  . Follow-up    Pt saw MW on 7.30.2018 for an acute visit. Pt states her SOB has not improved since last OV. Pt states she still  has a dry cough. Pt denies CP/tightness, f/c/s, chest congestion.    Gina Lyons is had long-standing shortness of breath that remained somewhat unexplained. The onset was in summer 2017 at the time of admission for DVT. There is no clear-cut evidence of pulmonary embolism. She did have some groundglass opacities at that time. Initially diuresis helped but subsequently did not. Over time her CT scan of the chest to change to the extent that in spring 2018 dose addition she had MAI infection. I did a bronchoscopy in April 2018 and then saw her for follow-up in May 2018. At this point in time the cultures were negative but subsequently in June 2018 the cultures returned showing MAI infection. I had a phone conversation with her and we opted to follow because she was feeling better. Now she presents for follow-up since her visit in May 2018 she has had worsening shortness of breath. In the interim she even saw my colleague Dr. Legrand Como wert for worsening shortness of breath. Walking desaturation test 185 feet 3 laps on room air with a full head probe: d walk only one lap and got very short of breath. She was short of breath for the remaining 510 minutes of a conversation in the room. She did not desaturate below 100% and she did  not get tachycardic. She is also reporting severe associated significant fatigue. Dyspnea and symptoms are worse in the last few months. During household work is extremely tiring. She says the fatigue has been worked up extensively by her primary care physician without any obvious cause  Last d-dimer September 2017 was normal. normal. She's currently not on anticoagulation  Echocardiogram August 2017 with ejection fraction 55% with normal nuclear medicine cardiac stress test November 2017  Normal allergy tests July 2018  Chest x-ray 03/20/2017: Personally visualized. Chronic bronchitic changes. Radiology second running high resolution CT chest.  Results for SADHANA, FRATER (MRN [id number]) as of 03/28/2017 09:26  Ref. Range 11/28/2016 15:35  ACID FAST SMEAR (AFB) Unknown Rpt  ACID FAST CULTURE WITH REFLEXED SENSITIVITIES Unknown Rpt (A)  Acid Fast Culture Unknown Positive (A)  Organism ID Unknown Comment (A)  MAC SUSCEPTIBILITY BROTH   Unknown Rpt (A)  AFB ORGANISM ID BY DNA PROBE Unknown Rpt (A)   IMPRESSION: 1. 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 Findings have mildly worsened in the interval since 03/30/2016, including a new dominant 13 mm nodular opacity in the right lower lobe. Recommend initial follow-up post treatment high-resolution chest CT in 6 months. 2. Mild patchy air trapping in both lungs, suggesting small airways disease. 3. Aortic atherosclerosis.  One vessel coronary atherosclerosis.   Electronically Signed   By: Ilona Sorrel M.D.   On: 10/25/2016 10:07   OV 05/11/2017  Chief Complaint  Patient presents with  . Follow-up    Pt states that she still  has tingling and pain in her ankle from the blood clot. Still becomes SOB on exertion, but is doing better. Does have a lot of dry coughing. Denies any CP.   Follow-up dyspnea that is now deemed due to bronchiectasis associated with MAI infection. At last visit d-dimer was high we did a CT angiogram ruled out pulmonary embolism but did show bronchiectasis associated with MAI infection. I referred her to infectious diseases Dr.: She is  now on MAI treatment. This is causing significant nausea and fatigue. The trying different protocols and it seems to be helping. With the MAI treatment her dyspnea might be better but her cough is definitely not worse. In association she also has other nonspecific different cans of symptoms. She did complain that last night her left cough is a bit swollen but rate is better. This no warmth. She does not want any duplex ultrasound for this at this point. Review of the notes indicate that have not tried inhaler therapy for her in the past. Of note she is finished a flu shot for this season   OV 06/19/2017  Chief Complaint  Patient presents with  . Follow-up    Pt still complains of both legs hurting, if anything pain has gotten worse. Also has complaints of dry cough, SOB on exertion and occ. CP.    Follow-up dyspnea in the setting of MAI infection  She continues on MAI treatment. She is supposed to come back in the early part of 2019 for some reason she had an appointment scheduled today.She says that Spiriva that I gave last emesis sample helped her dyspnea but then it gave her the shakes.  Willing to try another inhaler for dyspnea from another class.her main issue today is that she continues to have chron lower extremity pain on the left side. This is chronic. She needs to talk to primary care physician about this   OV 08/03/2017  Chief Complaint  Patient presents with  . Follow-up    Pt stated that she has a lot of questions for MR and wanted an appt. Pt has  been about the same since last visit. Has been having a lot of side effects from some meds she has been put on.    74 year old female for dyspnea in the setting of MAI infection  I just saw her June 19, 2017.  This visit is an acute visit.  She made this visit specifically because of concerns of drug intolerance with her MAI treatment.  She is here with her husband.  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  She is on triple drug regimen of ethambutol rifampin and azithromycin.  This gives a significant nausea and fatigue.  They did see infectious diseases Dr. Linus Salmons on July 11, 2017.  Repeat sputum culture was done and this did not show any acid-fast bacilli.  Culture is still pending although no growth so far.  Conversation was held with the patient and patient agreed to start inhaled tobramycin.  She does have a history of tinnitus which was noted by infectious diseases and audiology appointment has been set up but patient is now concerned about all the significant side effects of inhaled tobramycin that include ototoxicity.  She is frustrated about adding a fourth medication to a pre-existing regimen.  She says she does not understand the rationale.  She is frustrated that all aspects of her treatment of benefits, risks and implications of not been communicated with her although the documentation is otherwise.  She is asking for blood work and chest x-ray and guidance from me about decision making with her treatment.  I did speak with infectious diseases Dr. Linus Salmons who is supportive of goal-directed therapy.  Patient is a whether the disease has potential to progress without Mycobacterium avium complex treatment.  On the other hand she has been explained to her that the fatigue and side effects of treatment can sometimes be overwhelming and sometimes patients  do opt for supportive care.  She had a husband wondering about a 2-week drug holiday of a 3 baseline  drugs.  At this point she is not inclined to start inhaled tobramycin at all.  There are no other issues.   OV 11/02/2017  Chief Complaint  Patient presents with  . Follow-up    Pt is still coughing in the mornings and does still become SOB with exertion and occ. chest pressure.    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  She subsequently saw Dr. Scharlene Gloss in January 2019 and has completely stopped her MAI treatment.  Sputum culture at that time was negative for growth [initial positive growth was April 2018].  She says her energy levels are much better and her fatigue is resolved after stopping MAI treatment.  Overall she feels better except early morning she has cough that lasts moderate amount of time and severity.  Once she starts a Spiriva she gets better.  She does have postnasal drip for which she only takes in [place] nasal spray.  She has a Flonase at home but she is not taking it.  She is a little bit frustrated with the cough but nevertheless is not overwhelming.  She prefers conservative line of treatment.   OV 12/15/2017  Chief Complaint  Patient presents with  . Follow-up    Pt has had increased fatigue and breathing has become worse and states it has been worse for several weeks. Pt's husband states all pt wants to do is sleep. Pt also has some mild coughing.    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  This is an acute visit.  According to the husband and the patient for the last 3 weeks has increased fatigue.  She is tired all the time even at rest.  When she exerts herself the fatigue is worse.  She stopped after walking from the house to the mailbox because of fatigue.  The fatigue is associated with shortness of breath but only the shortness of breath is present on  exertion and relieved by rest.  There is no associated chest pain orthopnea or proximal nocturnal dyspnea or worsening edema or hemoptysis or chest pain.  However there is some increased wheezing.  Also at night when she is sleeping she feels rattling in her chest.  There is no respiratory infection fever.  Just back in September 74 year old with pulmonary embolism.  The husband feels that his blood pressure is low because of the systolic being 096 but the mean arterial pressure is adequate.  She is currently not on MAI treatment due to intolerance.  Walking desaturation test 180 feet x 3 laps on room air with a forehead probe.  She started off at 100% and stopped when she finished one lap because of shortness of breath.  She only walk very slowly.  Pulse ox when she stopped was 96%.  It was a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

## 2019-12-05 NOTE — Addendum Note (Signed)
Addended byEdmon Crape B on: 12/05/2019 11:11 AM   Modules accepted: Orders

## 2019-12-06 DIAGNOSIS — J209 Acute bronchitis, unspecified: Secondary | ICD-10-CM | POA: Diagnosis not present

## 2019-12-24 ENCOUNTER — Other Ambulatory Visit: Payer: Self-pay

## 2019-12-24 ENCOUNTER — Ambulatory Visit (HOSPITAL_COMMUNITY): Payer: Medicare Other | Attending: Cardiology

## 2019-12-24 DIAGNOSIS — R0600 Dyspnea, unspecified: Secondary | ICD-10-CM

## 2019-12-24 DIAGNOSIS — J479 Bronchiectasis, uncomplicated: Secondary | ICD-10-CM | POA: Insufficient documentation

## 2019-12-24 DIAGNOSIS — Z8619 Personal history of other infectious and parasitic diseases: Secondary | ICD-10-CM | POA: Insufficient documentation

## 2019-12-24 DIAGNOSIS — R0609 Other forms of dyspnea: Secondary | ICD-10-CM

## 2019-12-30 ENCOUNTER — Other Ambulatory Visit (HOSPITAL_COMMUNITY)
Admission: RE | Admit: 2019-12-30 | Discharge: 2019-12-30 | Disposition: A | Discharge status: Home / Self Care | Payer: Medicare Other | Source: Ambulatory Visit | Attending: Internal Medicine | Admitting: Internal Medicine

## 2019-12-30 DIAGNOSIS — Z01812 Encounter for preprocedural laboratory examination: Secondary | ICD-10-CM | POA: Insufficient documentation

## 2019-12-30 DIAGNOSIS — Z20822 Contact with and (suspected) exposure to covid-19: Secondary | ICD-10-CM | POA: Insufficient documentation

## 2019-12-30 LAB — SARS CORONAVIRUS 2 (TAT 6-24 HRS): SARS Coronavirus 2: NEGATIVE

## 2020-01-01 NOTE — Progress Notes (Signed)
Mild stiffness of heart muscle otherwise normal echo.  Will discuss at follow-up

## 2020-01-02 ENCOUNTER — Ambulatory Visit (INDEPENDENT_AMBULATORY_CARE_PROVIDER_SITE_OTHER): Payer: Medicare Other | Admitting: Internal Medicine

## 2020-01-02 ENCOUNTER — Other Ambulatory Visit: Payer: Self-pay

## 2020-01-02 DIAGNOSIS — J479 Bronchiectasis, uncomplicated: Secondary | ICD-10-CM | POA: Diagnosis not present

## 2020-01-02 LAB — PULMONARY FUNCTION TEST
DL/VA % pred: 77 %
DL/VA: 3.26 ml/min/mmHg/L
DLCO unc % pred: 74 %
DLCO unc: 12.78 ml/min/mmHg
FEF 25-75 Post: 1.28 L/sec
FEF 25-75 Pre: 1.17 L/sec
FEF2575-%Change-Post: 8 %
FEF2575-%Pred-Post: 88 %
FEF2575-%Pred-Pre: 81 %
FEV1-%Change-Post: 2 %
FEV1-%Pred-Post: 97 %
FEV1-%Pred-Pre: 94 %
FEV1-Post: 1.74 L
FEV1-Pre: 1.71 L
FEV1FVC-%Change-Post: 8 %
FEV1FVC-%Pred-Pre: 97 %
FEV6-%Change-Post: -6 %
FEV6-%Pred-Post: 96 %
FEV6-%Pred-Pre: 102 %
FEV6-Post: 2.21 L
FEV6-Pre: 2.35 L
FEV6FVC-%Change-Post: 0 %
FEV6FVC-%Pred-Post: 105 %
FEV6FVC-%Pred-Pre: 105 %
FVC-%Change-Post: -6 %
FVC-%Pred-Post: 91 %
FVC-%Pred-Pre: 97 %
FVC-Post: 2.21 L
FVC-Pre: 2.36 L
Post FEV1/FVC ratio: 79 %
Post FEV6/FVC ratio: 100 %
Pre FEV1/FVC ratio: 72 %
Pre FEV6/FVC Ratio: 100 %

## 2020-01-02 NOTE — Progress Notes (Signed)
PFT completed today.  

## 2020-01-03 NOTE — Progress Notes (Signed)
Patient identification verified. Results of recent echo reviewed. Per Dr. Chase Caller, echo shows mild stiffness of heart muscle otherwise normal echo. Will discuss at follow up appointment. Patient verbalized understanding of results.

## 2020-01-05 DIAGNOSIS — J209 Acute bronchitis, unspecified: Secondary | ICD-10-CM | POA: Diagnosis not present

## 2020-01-13 ENCOUNTER — Other Ambulatory Visit: Payer: Self-pay

## 2020-01-13 ENCOUNTER — Ambulatory Visit (INDEPENDENT_AMBULATORY_CARE_PROVIDER_SITE_OTHER): Payer: Medicare Other | Admitting: Internal Medicine

## 2020-01-13 DIAGNOSIS — R918 Other nonspecific abnormal finding of lung field: Secondary | ICD-10-CM

## 2020-01-13 DIAGNOSIS — Z8619 Personal history of other infectious and parasitic diseases: Secondary | ICD-10-CM | POA: Diagnosis not present

## 2020-01-13 DIAGNOSIS — R0609 Other forms of dyspnea: Secondary | ICD-10-CM

## 2020-01-13 DIAGNOSIS — J479 Bronchiectasis, uncomplicated: Secondary | ICD-10-CM

## 2020-01-13 DIAGNOSIS — R0600 Dyspnea, unspecified: Secondary | ICD-10-CM | POA: Diagnosis not present

## 2020-01-13 MED ORDER — STIOLTO RESPIMAT 2.5-2.5 MCG/ACT IN AERS: 2.0000 | INHALATION_SPRAY | Freq: Every day | RESPIRATORY_TRACT | 4 refills | Status: AC

## 2020-01-13 NOTE — Progress Notes (Signed)
74 year old female former smoker, only 3 pack year history, and for pulmonary consult July 2017 for DVT (No PE on CT chest x 3 ) treated with Xarelto.   TEST Gina Lyons.  Venous Doppler, 03/04/2016 showed DVT in the short segment of left peroneal vein CTA chest , 7/18 2017 negative for pulmonary embolism, bibasilar sub-solid pulmonary nodules measuring up to 7 mm CTA chest 03/19/2016 negative for pulmonary embolism, stable dilation of the ascending aorta CTA chest 03/30/2016 negative for pulmonary emboli, bilateral ground glass opacities, nodular opacities within the lingula, right middle lobe and right lower lobe VQ scan 03/31/16 bilateral patchy matched ventilation/perfusion defects, intermediate  probability for pulmonary embolus. 2D echo 03/31/16>EF 50-55%, gr 1 DD , PAP 91mHg Pulmonary function test on 03/31/2016 showed a FEV1 at 107%, ratio 77, FVC of 105%, no significant bronchodilator response, DLCO 63%  04/12/2016 post hospital follow-up Patient presents for a post hospital follow-up. It was diagnosed with a DVT on the left 03/04/2016. She was started on Xarelto. She was on Estradiol and she stopped this. CT chest on July 18 was negative for PE. Patient had 3 weeks of progressive shortness of breath She was admitted on 03/30/2016. Patient was admitted August 9 through 04/01/2016 for progressive shortness of breath. CT chest was done that was negative for pulmonary embolism. It did show bilateral groundglass opacities  and nodular opacities within the lingula, right middle lobe and right lower lobe. Was transitioned from XClintontoo Lyons and hospitalization. Patient did undergo gentle diuresis. She was also treated with Levaquin for questionable atypical infection. A VQ scan was done that showed bilateral patchy matched ventilation and perfusion defects with intermittent probability for pulmonary embolism. She says she was told she had a PE.from this .   A 2-D echo showed a normal EF  at 50-55% with grade 1 diastolic dysfunctions and pulmonary artery pressures at 25 m of mercury. Pulmonary function test showed normal lung function with an FEV1 at 107%. Patient did have a diffusing defect =DLCO 63%.  Autoimmune and CTD workup was neg (HIV, ANCA, GBM, CCP , dsDNA ab , ANA )  Discharge. Patient is feeling some better but still gets winded with walking. No dyspnea at rest .  She denies cough, hemoptysis , abd pain, n/v./d. She does have some soreness along left LE.  Says prior to admit she was very active with yard work , cycling and house work. No dyspnea at all.  Prior to admit no long travel, injury , surgery . No hx of DVT/PE . No FH of DVT/PE.  No hx of cancer.     OV 05/13/2016  Chief Complaint  Patient presents with  . Follow-up    Pt here after CXR and seeing TP on 8.22.17. Pt states her SOB has not improved since also OV. PT c/o DOE, intermittent dry cough, chest tightness when SOB and resolves when she lays down. Pt states she had mild left foot swelling on 9.21.17, this has resolved. Pt denies f/c/s.     Gina Polite74 year old female with very limited remote smoking. In July 2017 she was admitted with left-sided DVT and was started on Xarelto. Sometime after that according to her history she started developing shortness of breath which has persisted to this day despite changes with Xarelto to es.E;liquis. This dyspnea is pervasive. It is moderate to severe in intensity. It is present for exertion and relieved by rest. Class II-III levels of activity makes her dyspneic. There is  no orthopnea paroxysmal nocturnal dyspnea. She had 3 CT scans of the chest in July 2017 that ruled out pulmonary embolism but in August 2017 she did have some new groundglass opacities. August 2017 VQ scan was of intermediate probability. Only function does show reduction in diffusion capacity suggesting the groundglass opacities might be real. She did have autoimmune workup in August 2017  along with complement and vascular this workup that was normal. She presents today with her husband. She is really frustrated by her dyspnea. Chest x-ray personally visualized shows some nonspecific scarring. She is not able to do her household activities.  she has a dry cough of mild intensity for the last 6 months. Cough preceded onset of dyspnea and DVT. At last visit they specifically denied this cough. This no associated wheezing or orthopnea paroxysmal nocturnal dyspnea or chills.    Exhaled nitric oxide testing today in the office 05/13/2016: 23 ppb and normal   Walking desaturation test on 05/13/2016 185 feet x 3 laps:   did NOT desaturate. Rest pulse ox was 100%, final pulse ox was 100% was the final pulse ox w heart rate response was at rest78. HR response  to 94/min at peak exertion.    Noted SCL 70 not done (no dysphagia)  D dimer this visti 0.36 and n  ormal  Dg Chest 2 View  Result Date: 05/13/2016 CLINICAL DATA:  Follow-up pulmonary infiltrates. EXAM: CHEST  2 VIEW COMPARISON:  04/07/2016 and 03/31/2016 FINDINGS: Mild hyperinflation is stable. Stable linear densities in the anterior left lung. No significant airspace disease or consolidation. Heart and mediastinum are within normal limits and stable. Surgical plate in the lower cervical spine. Trachea is midline. No large pleural effusions. Old left seventh rib fracture. IMPRESSION: Stable linear densities in the left lung are probably related to scarring. No acute cardiopulmonary disease. Electronically Signed   By: Gina Lyons M.D.   On: 05/13/2016 14:25       OV 06/01/2016  Chief Complaint  Patient presents with  . Follow-up    Pt here after CPST. Pt states her SOB has slightly improved since last OV. Pt c/o dry cough and occassional chest tightness - under breasts after eating dinner in evening. Pt denies chest congestion and f/c/s.     Dyspnea evaluation. Here for review of pulmonary stress test. She underwent  pulmonary stress test 05/26/2016. She gave an excellent effort. She had normal VO2 max and anaerobic threshold. Nevertheless there is a flattening of her O2 pulse curve that is significant. This is associated with tachypnea and dead space ventilation and approaching ventilatory limits. Pulse ox did not go down. In summary this is a combinati    She is reporting weight gain since a deep vein thrombosis. So far pulmonary embolism has never been confirmed. In fact the CT chest in October 2017 only shows groundglass opacities. Echocardiogram shows diastolic dysfunction. In addition after coming off her estrogen which she was taking for many years [in July 2017 after DVT] she is reporting significant hot flashes which are frustrating her. She continues to suffer from dyspnea and is frustrated by this as well.     OV 08/10/2016  Chief Complaint  Patient presents with  . Follow-up    Pt states overall her SOB has improved but has bad days every now and then. Pt c/o more SOB today and occassional dry cough with some  frank blood - size of penny. Pt also states she has chest tightness when she is SOB -  this resolves with rest.     Follow-up dyspnea: We finally determined that this dyspnea was unlikely due to pulmonary embolism. Was related to diastolic dysfunction and pulmonary groundglass infiltrates sustained in July 2017 at the time of deep vein thrombosis diagnosis. Last visit October 2017 I started Lasix therapy. Since then with the passage of time and Lasix therapy she has improved with her dyspnea. She still does have mild dyspnea on exertion. She has not attended pulmonary rehabilitation. She does not want to go to pulmonary habitation due to logistical issues and time issues. She and her husband said they will start exercising on a road bike for an exercise bike by themselves gradually.  In terms of deep vein thrombosis related to estrogen intake.. She continues to be on anticoagulation since July  2017. She's completed 5 months of therapy. Interim d-dimer  normal. She has no bleeding complications. She wants to stop anticoagulation.  OV 10/25/2016  Chief Complaint  Patient presents with  . Follow-up    Pt here after HRCT. pt states her breathing is unchanged since last OV. Pt denies cough and CP/tightness.     Follow-up dyspnea: At last visit she told me that with Lasix therapy that her dyspnea improved. Today she tells me that she has now plateaued in terms of improvement. She still has dyspnea on exertion relieved by rest. The Lasix is helping but without the Lasix it is worse. It at the same time she feels she has not returned to her baseline pre-summer 2017. High-resolution CT scan of the chest done today and personally visualized shows the following abnormalities described suggestive of MAI infection  In terms of the left lower extremity DVT: In December 2017 we stopped anticoag based on normal D-dimer. She says since summer 2017 continues with daily left pain that sarts in ankle and spreads up to popliteal area along the venous course. On February 2018 on the 21st she had Doppler ultrasound of the left lower extremity that did not show any DVT. She also had chemistries that are normal. She takes Naprosyn once daily for this. She says all this is new since the DVT. She is frustrated by the pain. She says that primary care has deferred this issue to me also notices associated edema. She is wondering about vascular insufficiency and is requesting whether she should be referred to a vascular evaluation  Ct Chest High Resolution  Result Date: 10/25/2016 CLINICAL DATA:  Dyspnea and wheezing. Former smoker. History of pulmonary embolism and DVT. Follow-up abnormal chest CT. EXAM: CT CHEST WITHOUT CONTRAST TECHNIQUE: Multidetector CT imaging of the chest was performed following the standard protocol without intravenous contrast. High resolution imaging of the lungs, as well as inspiratory and  expiratory imaging, was performed. COMPARISON:  03/30/2016 chest CT angiogram. 07/08/2016 chest radiograph. FINDINGS: Cardiovascular: Normal heart size. No significant pericardial fluid/thickening. Left anterior descending coronary atherosclerosis. Atherosclerotic thoracic aorta with stable mild ectasia of the ascending thoracic aorta (maximum diameter 3.9 cm). Normal caliber pulmonary arteries. Mediastinum/Nodes: Status post left hemithyroidectomy. No discrete right thyroid lobe nodules. Unremarkable esophagus. No pathologically enlarged axillary, mediastinal or gross hilar lymph nodes, noting limited sensitivity for the detection of hilar adenopathy on this noncontrast study. Lungs/Pleura: No pneumothorax. No pleural effusion. There is mild-to-moderate cylindrical and varicoid bronchiectasis extensively distributed throughout both lungs, most prominent in the basilar right upper lobe, right middle lobe and lingula. There is extensive patchy tree-in-bud and centrilobular nodularity throughout both lungs at the areas of bronchiectasis, which appears overall  mildly worsened since 03/30/2016, including a new dominant nodular 13 x 11 mm opacity in the medial dependent right lower lobe (series 3/ image 76). Bandlike subpleural opacities at the areas of bronchiectasis in the basilar right upper lobe, medial segment right middle lobe and lingula are stable and compatible with postinfectious scars. Otherwise no acute consolidative airspace disease or lung masses. Mild centrilobular emphysema. Mild patchy air trapping in both lungs on the expiration sequence. No frank honeycombing. Upper abdomen: Partially visualized left parapelvic renal cysts. Musculoskeletal: No aggressive appearing focal osseous lesions. Partially visualized surgical hardware from ACDF in the lower cervical spine. Stable T9 vertebral hemangioma. Mild thoracic spondylosis. IMPRESSION: 1. 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 Findings have mildly worsened in the interval since 03/30/2016, including a new dominant 13 mm nodular opacity in the right lower lobe. Recommend initial follow-up post treatment high-resolution chest CT in 6 months. 2. Mild patchy air trapping in both lungs, suggesting small airways disease. 3. Aortic atherosclerosis.  One vessel coronary atherosclerosis. Electronically Signed   By: Ilona Sorrel M.D.   On: 10/25/2016 10:07    OV 12/20/2016  Chief Complaint  Patient presents with  . Follow-up    Pt here after bronch. Pt states her breathing is unchanged since last OV. Pt c/o dry cough and chest heaviness. Pt denies f/c/s and CP/tightness.    Follow-up pulmonary infiltrates not otherwise specified: She underwent bronchoscopy with bronchoalveolar lavage of July 2018 which showed 57% monocytes and 26% neutrophils. Cultures are negative except for colonizing contaminant mold ; low colony count. Last CT scan was March 2018  Follow-up dyspnea: She continues to have dyspnea. She continues to insist that is all after the DVT. There are no new issues. She is planning to exercise [place]    03/20/2017 acute extended ov/Wert re: sob at rest and dry cough x one year  Chief Complaint  Patient presents with  . Acute Visit    Increased SOB, no energy, wheezing and non prod cough x 6 wks. Her cough is non prod, and esp worse at night, sometimes wakes her up.  No appetite.    after last ov with MR 12/20/16 (no change rx) Breathing was better for two weeks only if sitting but doe  x 50 ft  And mailbox and stops 200 ft flat ever since dvt "PE"  Since then sob at rest unless sleeping p diazepam and  "sleeping pill" and tramadol not aware of noct symptoms but after wakes each day  Always stops due to wob just walking to kitchen  Cough worse/ very harsh dry day > noct  PC rec prozac higher dose but no better so back to 10 mg daily  No better on saba hfa/ neb   No obvious patterns in day to day or daytime variability or assoc excess/ purulent sputum or mucus plugs or hemoptysis or cp or chest tightness,  or overt sinus or hb symptoms. No unusual exp hx or h/o childhood pna/ asthma or knowledge of premature birth.    OV 03/28/2017  Chief Complaint  Patient presents with  . Follow-up    Pt saw MW on 7.30.2018 for an acute visit. Pt states her SOB has not improved since last OV. Pt states she still  has a dry cough. Pt denies CP/tightness, f/c/s, chest congestion.    Gina Lyons is had long-standing shortness of breath that remained somewhat unexplained. The onset was in summer 2017 at the time of admission for DVT. There is no clear-cut evidence of pulmonary embolism. She did have some groundglass opacities at that time. Initially diuresis helped but subsequently did not. Over time her CT scan of the chest to change to the extent that in spring 2018 dose addition she had MAI infection. I did a bronchoscopy in April 2018 and then saw her for follow-up in May 2018. At this point in time the cultures were negative but subsequently in June 2018 the cultures returned showing MAI infection. I had a phone conversation with her and we opted to follow because she was feeling better. Now she presents for follow-up since her visit in May 2018 she has had worsening shortness of breath. In the interim she even saw my colleague Dr. Legrand Como wert for worsening shortness of breath. Walking desaturation test 185 feet 3 laps on room air with a full head probe: d walk only one lap and got very short of breath. She was short of breath for the remaining 510 minutes of a conversation in the room. She did not desaturate  below 100% and she did not get tachycardic. She is also reporting severe associated significant fatigue. Dyspnea and symptoms are worse in the last few months. During household work is extremely tiring. She says the fatigue has been worked up extensively by her primary care physician without any obvious cause  Last d-dimer September 2017 was normal. normal. She's currently not on anticoagulation  Echocardiogram August 2017 with ejection fraction 55% with normal nuclear medicine cardiac stress test November 2017  Normal allergy tests July 2018  Chest x-ray 03/20/2017: Personally visualized. Chronic bronchitic changes. Radiology second running high resolution CT chest.  Results for KAYCIE, PEGUES (MRN [id number]) as of 03/28/2017 09:26  Ref. Range 11/28/2016 15:35  ACID FAST SMEAR (AFB) Unknown Rpt  ACID FAST CULTURE WITH REFLEXED SENSITIVITIES Unknown Rpt (A)  Acid Fast Culture Unknown Positive (A)  Organism ID Unknown Comment (A)  MAC SUSCEPTIBILITY BROTH   Unknown Rpt (A)  AFB ORGANISM ID BY DNA PROBE Unknown Rpt (A)   IMPRESSION: 1. 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 Findings have mildly worsened in the interval since 03/30/2016, including a new dominant 13 mm nodular opacity in the right lower lobe. Recommend initial follow-up post treatment high-resolution chest CT in 6 months. 2. Mild patchy air trapping in both lungs, suggesting small airways disease. 3. Aortic atherosclerosis.  One vessel coronary atherosclerosis.   Electronically Signed   By: Ilona Sorrel M.D.   On: 10/25/2016 10:07   OV 05/11/2017  Chief Complaint  Patient presents with  . Follow-up    Pt  states that she still has tingling and pain in her ankle from the blood clot. Still becomes SOB on exertion, but is doing better. Does have a lot of dry coughing. Denies any CP.   Follow-up dyspnea that is now deemed due to bronchiectasis associated with MAI infection. At last visit d-dimer was high we did a CT angiogram ruled out pulmonary embolism but did show bronchiectasis associated with MAI infection. I referred her to infectious diseases Dr.: She is  now on MAI treatment. This is causing significant nausea and fatigue. The trying different protocols and it seems to be helping. With the MAI treatment her dyspnea might be better but her cough is definitely not worse. In association she also has other nonspecific different cans of symptoms. She did complain that last night her left cough is a bit swollen but rate is better. This no warmth. She does not want any duplex ultrasound for this at this point. Review of the notes indicate that have not tried inhaler therapy for her in the past. Of note she is finished a flu shot for this season   OV 06/19/2017  Chief Complaint  Patient presents with  . Follow-up    Pt still complains of both legs hurting, if anything pain has gotten worse. Also has complaints of dry cough, SOB on exertion and occ. CP.    Follow-up dyspnea in the setting of MAI infection  She continues on MAI treatment. She is supposed to come back in the early part of 2019 for some reason she had an appointment scheduled today.She says that Spiriva that I gave last emesis sample helped her dyspnea but then it gave her the shakes.  Willing to try another inhaler for dyspnea from another class.her main issue today is that she continues to have chron lower extremity pain on the left side. This is chronic. She needs to talk to primary care physician about this   OV 08/03/2017  Chief Complaint  Patient presents with  . Follow-up    Pt stated that she has a lot of questions for MR and  wanted an appt. Pt has been about the same since last visit. Has been having a lot of side effects from some meds she has been put on.    74 year old female for dyspnea in the setting of MAI infection  I just saw her June 19, 2017.  This visit is an acute visit.  She made this visit specifically because of concerns of drug intolerance with her MAI treatment.  She is here with her husband.  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  She is on triple drug regimen of ethambutol rifampin and azithromycin.  This gives a significant nausea and fatigue.  They did see infectious diseases Dr. Linus Salmons on July 11, 2017.  Repeat sputum culture was done and this did not show any acid-fast bacilli.  Culture is still pending although no growth so far.  Conversation was held with the patient and patient agreed to start inhaled tobramycin.  She does have a history of tinnitus which was noted by infectious diseases and audiology appointment has been set up but patient is now concerned about all the significant side effects of inhaled tobramycin that include ototoxicity.  She is frustrated about adding a fourth medication to a pre-existing regimen.  She says she does not understand the rationale.  She is frustrated that all aspects of her treatment of benefits, risks and implications of not been communicated with her although the documentation is otherwise.  She is asking for blood work and chest x-ray and guidance from me about decision making with her treatment.  I did speak with infectious diseases Dr. Linus Salmons who is supportive of goal-directed therapy.  Patient is a whether the disease has potential to progress without Mycobacterium avium complex treatment.  On the other hand she has been explained to her that the fatigue and side effects of treatment can sometimes be overwhelming and sometimes patients  do opt for supportive care.  She had a husband wondering about a 2-week drug  holiday of a 3 baseline drugs.  At this point she is not inclined to start inhaled tobramycin at all.  There are no other issues.   OV 11/02/2017  Chief Complaint  Patient presents with  . Follow-up    Pt is still coughing in the mornings and does still become SOB with exertion and occ. chest pressure.    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  She subsequently saw Dr. Scharlene Gloss in January 2019 and has completely stopped her MAI treatment.  Sputum culture at that time was negative for growth [initial positive growth was April 2018].  She says her energy levels are much better and her fatigue is resolved after stopping MAI treatment.  Overall she feels better except early morning she has cough that lasts moderate amount of time and severity.  Once she starts a Spiriva she gets better.  She does have postnasal drip for which she only takes in [place] nasal spray.  She has a Flonase at home but she is not taking it.  She is a little bit frustrated with the cough but nevertheless is not overwhelming.  She prefers conservative line of treatment.   OV 12/15/2017  Chief Complaint  Patient presents with  . Follow-up    Pt has had increased fatigue and breathing has become worse and states it has been worse for several weeks. Pt's husband states all pt wants to do is sleep. Pt also has some mild coughing.    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  This is an acute visit.  According to the husband and the patient for the last 3 weeks has increased fatigue.  She is tired all the time even at rest.  When she exerts herself the fatigue is worse.  She stopped after walking from the house to the mailbox because of fatigue.  The fatigue is associated with shortness of breath but only the shortness  of breath is present on exertion and relieved by rest.  There is no associated chest pain orthopnea or proximal nocturnal dyspnea or worsening edema or hemoptysis or chest pain.  However there is some increased wheezing.  Also at night when she is sleeping she feels rattling in her chest.  There is no respiratory infection fever.  Just back in September 74 year old with pulmonary embolism.  The husband feels that his blood pressure is low because of the systolic being 601 but the mean arterial pressure is adequate.  She is currently not on MAI treatment due to intolerance.  Walking desaturation test 180 feet x 3 laps on room air with a forehead probe.  She started off at 100% and stopped when she finished one lap because of shortness of breath.  She only walk very slowly.  Pulse ox when she stopped was 96%.  It was a 4%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

## 2020-01-13 NOTE — Patient Instructions (Addendum)
ICD-10-CM   1. Dyspnea on exertion  R06.00   2. Multiple pulmonary nodules determined by computed tomography of lung  R91.8   3. 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 Bronchiectasis without complication ([place])  [id number]     Glad some improved after night o2 Lung function some worse over years  Plan  - stop symbicort  - try stiolto daily  - CT scan chest without contrast in 1 year - repeat spiroemtry pre and post bd in 3 months  Followup  - face to face visit in 3 months - if lung functions still worse then consider CPST v [facility] referral

## 2020-01-16 DIAGNOSIS — G43519 Persistent migraine aura without cerebral infarction, intractable, without status migrainosus: Secondary | ICD-10-CM | POA: Diagnosis not present

## 2020-01-16 DIAGNOSIS — I10 Essential (primary) hypertension: Secondary | ICD-10-CM | POA: Diagnosis not present

## 2020-01-16 DIAGNOSIS — E039 Hypothyroidism, unspecified: Secondary | ICD-10-CM | POA: Diagnosis not present

## 2020-02-05 DIAGNOSIS — J209 Acute bronchitis, unspecified: Secondary | ICD-10-CM | POA: Diagnosis not present

## 2020-03-06 DIAGNOSIS — J209 Acute bronchitis, unspecified: Secondary | ICD-10-CM | POA: Diagnosis not present

## 2020-03-12 DIAGNOSIS — R600 Localized edema: Secondary | ICD-10-CM | POA: Diagnosis not present

## 2020-03-12 DIAGNOSIS — M79641 Pain in right hand: Secondary | ICD-10-CM | POA: Diagnosis not present

## 2020-03-12 DIAGNOSIS — M79642 Pain in left hand: Secondary | ICD-10-CM | POA: Diagnosis not present

## 2020-03-12 DIAGNOSIS — M79643 Pain in unspecified hand: Secondary | ICD-10-CM | POA: Diagnosis not present

## 2020-03-25 DIAGNOSIS — L814 Other melanin hyperpigmentation: Secondary | ICD-10-CM | POA: Diagnosis not present

## 2020-03-25 DIAGNOSIS — L905 Scar conditions and fibrosis of skin: Secondary | ICD-10-CM | POA: Diagnosis not present

## 2020-03-25 DIAGNOSIS — R202 Paresthesia of skin: Secondary | ICD-10-CM | POA: Diagnosis not present

## 2020-03-25 DIAGNOSIS — D485 Neoplasm of uncertain behavior of skin: Secondary | ICD-10-CM | POA: Diagnosis not present

## 2020-03-25 DIAGNOSIS — Z85828 Personal history of other malignant neoplasm of skin: Secondary | ICD-10-CM | POA: Diagnosis not present

## 2020-03-25 DIAGNOSIS — C44319 Basal cell carcinoma of skin of other parts of face: Secondary | ICD-10-CM | POA: Diagnosis not present

## 2020-03-25 DIAGNOSIS — C44729 Squamous cell carcinoma of skin of left lower limb, including hip: Secondary | ICD-10-CM | POA: Diagnosis not present

## 2020-04-06 DIAGNOSIS — J209 Acute bronchitis, unspecified: Secondary | ICD-10-CM | POA: Diagnosis not present

## 2020-04-07 DIAGNOSIS — C44319 Basal cell carcinoma of skin of other parts of face: Secondary | ICD-10-CM | POA: Diagnosis not present

## 2020-04-07 DIAGNOSIS — C44729 Squamous cell carcinoma of skin of left lower limb, including hip: Secondary | ICD-10-CM | POA: Diagnosis not present

## 2020-04-21 DIAGNOSIS — C44319 Basal cell carcinoma of skin of other parts of face: Secondary | ICD-10-CM | POA: Diagnosis not present

## 2020-04-22 ENCOUNTER — Other Ambulatory Visit: Payer: Self-pay | Admitting: *Deleted

## 2020-04-22 DIAGNOSIS — I712 Thoracic aortic aneurysm, without rupture, unspecified: Secondary | ICD-10-CM

## 2020-05-07 DIAGNOSIS — J209 Acute bronchitis, unspecified: Secondary | ICD-10-CM | POA: Diagnosis not present

## 2020-05-13 ENCOUNTER — Ambulatory Visit
Admission: RE | Admit: 2020-05-13 | Discharge: 2020-05-13 | Disposition: A | Discharge status: Home / Self Care | Payer: Medicare Other | Source: Ambulatory Visit | Attending: Surgery | Admitting: Surgery

## 2020-05-13 ENCOUNTER — Encounter: Payer: Self-pay | Admitting: Surgery

## 2020-05-13 ENCOUNTER — Other Ambulatory Visit: Payer: Self-pay

## 2020-05-13 ENCOUNTER — Ambulatory Visit: Payer: Medicare Other | Admitting: Surgery

## 2020-05-13 VITALS — BP 111/72 | HR 91 | Temp 97.9°F | Resp 18 | Ht 62.0 in | Wt 128.2 lb

## 2020-05-13 DIAGNOSIS — J479 Bronchiectasis, uncomplicated: Secondary | ICD-10-CM | POA: Diagnosis not present

## 2020-05-13 DIAGNOSIS — J8410 Pulmonary fibrosis, unspecified: Secondary | ICD-10-CM | POA: Diagnosis not present

## 2020-05-13 DIAGNOSIS — I712 Thoracic aortic aneurysm, without rupture, unspecified: Secondary | ICD-10-CM

## 2020-05-13 DIAGNOSIS — I700 Atherosclerosis of aorta: Secondary | ICD-10-CM | POA: Diagnosis not present

## 2020-05-13 NOTE — Progress Notes (Signed)
HPI:  The patient returns today for follow-up of a 4.0 cm fusiform ascending aortic aneurysm.  She has been feeling well overall and denies any chest pain or back pain.  Current Outpatient Medications  Medication Sig Dispense Refill   amLODipine (NORVASC) 5 MG tablet Take 5 mg by mouth at bedtime.     Biotin 5 MG CAPS Take 5 mg by mouth daily.     diazepam (VALIUM) 10 MG tablet Take 10 mg by mouth every 6 (six) hours as needed for anxiety.     levothyroxine (SYNTHROID, LEVOTHROID) 112 MCG tablet Take 112 mcg by mouth daily before breakfast. Brand name only**     sodium chloride (OCEAN) 0.65 % SOLN nasal spray Place 1 spray into both nostrils at bedtime as needed for congestion.     Tiotropium Bromide-Olodaterol (STIOLTO RESPIMAT) 2.5-2.5 MCG/ACT AERS Inhale 2 puffs into the lungs daily. 4 g 4   No current facility-administered medications for this visit.     Physical Exam: BP 111/72 (BP Location: Left Arm, Patient Position: Sitting, Cuff Size: Normal)    Pulse 91    Temp 97.9 F (36.6 C)    Resp 18    Ht 5\' 2"  (1.575 m)    Wt 128 lb 3.2 oz (58.2 kg)    SpO2 94% Comment: RA   BMI 23.45 kg/m  She looks well. Cardiac exam shows a regular rate and rhythm with normal heart sounds.  There is no murmur. Lungs are clear.  Diagnostic Tests:  CLINICAL DATA:  Thoracic aortic aneurysm.  EXAM: CT CHEST WITHOUT CONTRAST  TECHNIQUE: Multidetector CT imaging of the chest was performed following the standard protocol without IV contrast.  COMPARISON:  10/30/2019  FINDINGS: Cardiovascular: The heart size is normal. No substantial pericardial effusion. Coronary artery calcification is evident. Stable diameter ascending thoracic aorta at 4.0 cm. Atherosclerotic calcification is noted in the wall of the thoracic aorta.  Mediastinum/Nodes: No mediastinal lymphadenopathy. No evidence for gross hilar lymphadenopathy although assessment is limited by the lack of intravenous  contrast on today's study. The esophagus has normal imaging features. There is no axillary lymphadenopathy.  Lungs/Pleura: Previously identified peripheral right upper lobe nodule is stable at 3 mm. Subpleural nodule of concern identified previously in the medial right lower lobe is 6 x 4 mm today (76/8) compared to 5 x 3 mm previously. Areas of cylindrical bronchiectasis with bronchial wall thickening and peripheral tree-in-bud opacity in the right middle lobe and lingula again noted with areas of associated stable confluent opacity, likely scarring. 5 mm right middle lobe perifissural nodule on 81/8 is unchanged. 5 mm right middle lobe nodule on 98/8 is stable. Subtle tree-in-bud nodularity noted inferior and posterior right lower lobe and also peripheral left lower lobe is similar to prior with scattered areas of peripheral small airway impaction. Calcified granuloma again noted right lower lobe. No new suspicious pulmonary nodule or mass. No pleural effusion.  Upper Abdomen: Unremarkable.  Musculoskeletal: No worrisome lytic or sclerotic osseous abnormality.  IMPRESSION: 1. Stable diameter ascending thoracic aorta at 4.0 cm. Recommend annual imaging followup by CTA or MRA. This recommendation follows 2010 ACCF/AHA/AATS/ACR/ASA/SCA/SCAI/SIR/STS/SVM Guidelines for the Diagnosis and Management of Patients with Thoracic Aortic Disease. Circulation. 2010; 121: V893-Y101. Aortic aneurysm NOS (ICD10-I71.9) . 2. 3 mm right upper lobe nodule of concern on the previous study is unchanged in the interval. A second 5 x 3 mm nodule identified previously in the paraspinal right lower lobe is not substantially changed in the interval  but continued attention on follow-up recommended. 3. Stable appearance of additional scattered bilateral pulmonary nodules with areas of cylindrical bronchiectasis, bronchial wall thickening, and peripheral tree-in-bud opacity. Imaging features are most  suggestive of sequelae of atypical infection (including MAI). 4. Aortic Atherosclerosis (ICD10-I70.0).   Electronically Signed   By: Misty Stanley M.D.   On: 05/13/2020 11:26  Impression:  She has a stable 4.0 cm fusiform ascending aortic aneurysm that is well below the surgical threshold of 5.5 cm.  I reviewed the CT images with her and answered her questions.  The previously noted 3 mm right upper lobe nodule seen on her prior study is unchanged.  There is a second 5 x 3 mm nodule in the right lower lobe medially that is unchanged.  The other scattered bilateral pulmonary nodules are stable.  These changes are all suggestive of previous atypical infection or inflammation.  Plan:  She will return to see me in 1 year with a CT of the chest to follow-up on her ascending aortic aneurysm and the lung nodules.  I spent 20 minutes performing this established patient evaluation and > 50% of this time was spent face to face counseling and coordinating the care of this patient's aortic aneurysm and pulmonary nodules.   Gaye Pollack, MD [facility] [phone]

## 2020-06-06 DIAGNOSIS — J209 Acute bronchitis, unspecified: Secondary | ICD-10-CM | POA: Diagnosis not present

## 2020-09-23 ENCOUNTER — Other Ambulatory Visit: Payer: Self-pay | Admitting: *Deleted

## 2020-09-23 DIAGNOSIS — R918 Other nonspecific abnormal finding of lung field: Secondary | ICD-10-CM

## 2020-09-23 DIAGNOSIS — I712 Thoracic aortic aneurysm, without rupture, unspecified: Secondary | ICD-10-CM

## 2020-10-28 ENCOUNTER — Encounter: Payer: Self-pay | Admitting: Surgery

## 2020-10-28 ENCOUNTER — Ambulatory Visit: Payer: Medicare Other | Admitting: Surgery

## 2020-10-28 ENCOUNTER — Other Ambulatory Visit: Payer: Self-pay

## 2020-10-28 ENCOUNTER — Ambulatory Visit
Admission: RE | Admit: 2020-10-28 | Discharge: 2020-10-28 | Disposition: A | Discharge status: Home / Self Care | Payer: Medicare Other | Source: Ambulatory Visit | Attending: Surgery | Admitting: Surgery

## 2020-10-28 VITALS — BP 109/74 | HR 100 | Resp 20 | Ht 62.0 in | Wt 130.0 lb

## 2020-10-28 DIAGNOSIS — I712 Thoracic aortic aneurysm, without rupture, unspecified: Secondary | ICD-10-CM

## 2020-10-28 DIAGNOSIS — R918 Other nonspecific abnormal finding of lung field: Secondary | ICD-10-CM

## 2020-10-31 NOTE — Progress Notes (Signed)
HPI:  The patient is a 75 year old woman who returns for follow-up of a 4.0 cm fusiform ascending aortic aneurysm.  Her CT last year also showed a new 3 mm subpleural nodule in the right upper lobe anteriorly as well as a new 5 mm nodule in the left upper lobe.  The other smaller nodules were unchanged.  She denies any significant changes to her medical history.  She said that she feels poorly right now and has had a cough and some shortness of breath of the past few weeks.  She also reports some chest pressure that is not related to any particular activity.  Current Outpatient Medications  Medication Sig Dispense Refill  . amLODipine (NORVASC) 5 MG tablet Take 5 mg by mouth at bedtime.    . Biotin 5 MG CAPS Take 5 mg by mouth daily.    . diazepam (VALIUM) 10 MG tablet Take 10 mg by mouth every 6 (six) hours as needed for anxiety.    Marland Kitchen levothyroxine (SYNTHROID, LEVOTHROID) 112 MCG tablet Take 112 mcg by mouth daily before breakfast. Brand name only**    . sodium chloride (OCEAN) 0.65 % SOLN nasal spray Place 1 spray into both nostrils at bedtime as needed for congestion.    . Tiotropium Bromide-Olodaterol (STIOLTO RESPIMAT) 2.5-2.5 MCG/ACT AERS Inhale 2 puffs into the lungs daily. 4 g 4  . traZODone (DESYREL) 50 MG tablet Take by mouth.    . triamterene-hydrochlorothiazide (MAXZIDE) 75-50 MG tablet Take 1 tablet by mouth every morning.     No current facility-administered medications for this visit.     Physical Exam: BP 109/74   Pulse 100   Resp 20   Ht 5\' 2"  (1.575 m)   Wt 130 lb (59 kg)   SpO2 93% Comment: RA  BMI 23.78 kg/m  She looks uncomfortable.  Cardiac exam shows a regular rate and rhythm with normal heart sounds.  There is no murmur. Lung exam is clear.   Diagnostic Tests:  Narrative & Impression  CLINICAL DATA:  Follow-up thoracic aortic aneurysm. Patient reports chest tightness for 1 week. Former smoker.  EXAM: CT CHEST WITHOUT  CONTRAST  TECHNIQUE: Multidetector CT imaging of the chest was performed following the standard protocol without IV contrast.  COMPARISON:  05/13/2020 chest CT  FINDINGS: Cardiovascular: Normal heart size. No significant pericardial effusion/thickening. Left anterior descending coronary atherosclerosis. Atherosclerotic thoracic aorta with dilated 4.0 cm ascending thoracic aorta, previously 4.0 cm, stable. Maximum descending thoracic aortic diameter 2.9 cm. No evidence of acute intramural hematoma in the thoracic aorta. Normal caliber pulmonary arteries.  Mediastinum/Nodes: Left hemithyroidectomy. No discrete right thyroid nodules. Unremarkable esophagus. No pathologically enlarged axillary, mediastinal or hilar lymph nodes, noting limited sensitivity for the detection of hilar adenopathy on this noncontrast study.  Lungs/Pleura: No pneumothorax. No pleural effusion. Mild centrilobular emphysema. No acute consolidative airspace disease or lung masses. Mild to moderate cylindrical bronchiectasis scattered throughout both lungs, most prominent in the right middle lobe, basilar right upper lobe and lingula. Scattered regions of bandlike and linear subpleural consolidation, volume loss and distortion at the areas of bronchiectasis, most prominent in the basilar right upper lobe, compatible with postinfectious scarring. Patchy mild tree-in-bud opacities and centrilobular nodularity in the peripheral lungs bilaterally at the areas of bronchiectasis. The tree-in-bud opacities have mildly worsened in the basilar right lower lobe. These findings are otherwise not appreciably changed. Representative 5 mm medial right lower lobe centrilobular nodule (series 8/image 71), stable. No new significant pulmonary nodules. Scattered  subcentimeter calcified granulomas in both lungs are unchanged.  Upper abdomen: No acute abnormality.  Musculoskeletal: No aggressive appearing focal osseous  lesions. Partially visualized surgical hardware from ACDF. Mild thoracic spondylosis.  IMPRESSION: 1. Stable dilated 4.0 cm ascending thoracic aorta. Recommend annual imaging followup by CTA or MRA. This recommendation follows 2010 ACCF/AHA/AATS/ACR/ASA/SCA/SCAI/SIR/STS/SVM Guidelines for the Diagnosis and Management of Patients with Thoracic Aortic Disease. Circulation. 2010; 121: Q034-V425. Aortic aneurysm NOS (ICD10-I71.9). 2. One vessel coronary atherosclerosis. 3. 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 The tree-in-bud opacities have mildly worsened in the basilar right lower lobe. Findings are otherwise unchanged. 4. Aortic Atherosclerosis (ICD10-I70.0) and Emphysema (ICD10-J43.9).   Electronically Signed   By: Ilona Sorrel M.D.   On: 10/28/2020 13:24     Impression:  She has a stable 4 cm ascending thoracic aortic aneurysm.  This is well below the surgical threshold of 5.5 cm and has been stable since I have been following her.  I reviewed the CT images with her and answered her questions.  I stressed the importance of continued good blood pressure control in preventing further enlargement and acute aortic dissection.  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  She does have chronic shortness of breath which she has been seeing pulmonary medicine for.  She is going to see her PCP concerning her recent cough, shortness of breath, and chest discomfort.  This does not sound like anginal chest discomfort since it is not related to any activity although she does have LAD atherosclerosis on her CT. if this persists she may need cardiology evaluation.  She had a low risk nuclear stress test in 06/2016.  Plan:  I will see  her back in 1 year with a CTA of the chest.  I spent 20 minutes performing this established patient evaluation and > 50% of this time was spent face to face counseling and coordinating the care of this patient's aortic aneurysm.    Gaye Pollack, MD [facility] [phone]

## 2020-12-25 ENCOUNTER — Ambulatory Visit
Admission: RE | Admit: 2020-12-25 | Discharge: 2020-12-25 | Disposition: A | Discharge status: Home / Self Care | Payer: Medicare Other | Source: Ambulatory Visit | Attending: Internal Medicine | Admitting: Internal Medicine

## 2020-12-25 DIAGNOSIS — R0600 Dyspnea, unspecified: Secondary | ICD-10-CM

## 2020-12-25 DIAGNOSIS — R0609 Other forms of dyspnea: Secondary | ICD-10-CM

## 2020-12-25 DIAGNOSIS — J479 Bronchiectasis, uncomplicated: Secondary | ICD-10-CM

## 2020-12-25 DIAGNOSIS — R918 Other nonspecific abnormal finding of lung field: Secondary | ICD-10-CM

## 2020-12-29 ENCOUNTER — Encounter: Payer: Self-pay | Admitting: Internal Medicine

## 2020-12-29 ENCOUNTER — Ambulatory Visit: Payer: Medicare Other | Admitting: Internal Medicine

## 2020-12-29 ENCOUNTER — Other Ambulatory Visit: Payer: Self-pay

## 2020-12-29 VITALS — BP 130/78 | HR 79 | Temp 97.9°F | Ht 62.0 in | Wt 124.2 lb

## 2020-12-29 DIAGNOSIS — J479 Bronchiectasis, uncomplicated: Secondary | ICD-10-CM

## 2020-12-29 DIAGNOSIS — R0600 Dyspnea, unspecified: Secondary | ICD-10-CM | POA: Diagnosis not present

## 2020-12-29 DIAGNOSIS — Z8619 Personal history of other infectious and parasitic diseases: Secondary | ICD-10-CM | POA: Diagnosis not present

## 2020-12-29 DIAGNOSIS — R918 Other nonspecific abnormal finding of lung field: Secondary | ICD-10-CM

## 2020-12-29 DIAGNOSIS — R0609 Other forms of dyspnea: Secondary | ICD-10-CM

## 2020-12-29 NOTE — Patient Instructions (Addendum)
ICD-10-CM   1. Dyspnea on exertion  R06.00   2. Multiple pulmonary nodules determined by computed tomography of lung  R91.8   3. 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 Bronchiectasis without complication ([place])  N17.0    overall stable with Stiolto Noted that you are on MAI treatment through infectious diseases at [facility] in [place]  -This is for worsening infiltrates in the March 2022 c/CT scan   Plan  -Continue Stiolto -Continue MAI treatment through infectious diseases at [facility] - repeat spiroemtry pre and post bd in 6 months.  Last seen in May 2021.   Followup  - face to face visit in 6 months

## 2020-12-29 NOTE — Progress Notes (Signed)
75 year old female former smoker, only 3 pack year history, and for pulmonary consult July 2017 for DVT (No PE on CT chest x 3 ) treated with Xarelto.   TEST Gina Lyons.  Venous Doppler, 03/04/2016 showed DVT in the short segment of left peroneal vein CTA chest , 7/18 2017 negative for pulmonary embolism, bibasilar sub-solid pulmonary nodules measuring up to 7 mm CTA chest 03/19/2016 negative for pulmonary embolism, stable dilation of the ascending aorta CTA chest 03/30/2016 negative for pulmonary emboli, bilateral ground glass opacities, nodular opacities within the lingula, right middle lobe and right lower lobe VQ scan 03/31/16 bilateral patchy matched ventilation/perfusion defects, intermediate  probability for pulmonary embolus. 2D echo 03/31/16>EF 50-55%, gr 1 DD , PAP 40mHg Pulmonary function test on 03/31/2016 showed a FEV1 at 107%, ratio 77, FVC of 105%, no significant bronchodilator response, DLCO 63%  04/12/2016 post hospital follow-up Patient presents for a post hospital follow-up. It was diagnosed with a DVT on the left 03/04/2016. She was started on Xarelto. She was on Estradiol and she stopped this. CT chest on July 18 was negative for PE. Patient had 3 weeks of progressive shortness of breath She was admitted on 03/30/2016. Patient was admitted August 9 through 04/01/2016 for progressive shortness of breath. CT chest was done that was negative for pulmonary embolism. It did show bilateral groundglass opacities  and nodular opacities within the lingula, right middle lobe and right lower lobe. Was transitioned from XRamseytoo Eliquis and hospitalization. Patient did undergo gentle diuresis. She was also treated with Levaquin for questionable atypical infection. A VQ scan was done that showed bilateral patchy matched ventilation and perfusion defects with intermittent probability for pulmonary embolism. She says she was told she had a PE.from this .   A 2-D echo showed a normal EF at  50-55% with grade 1 diastolic dysfunctions and pulmonary artery pressures at 25 m of mercury. Pulmonary function test showed normal lung function with an FEV1 at 107%. Patient did have a diffusing defect =DLCO 63%.  Autoimmune and CTD workup was neg (HIV, ANCA, GBM, CCP , dsDNA ab , ANA )  Discharge. Patient is feeling some better but still gets winded with walking. No dyspnea at rest .  She denies cough, hemoptysis , abd pain, n/v./d. She does have some soreness along left LE.  Says prior to admit she was very active with yard work , cycling and house work. No dyspnea at all.  Prior to admit no long travel, injury , surgery . No hx of DVT/PE . No FH of DVT/PE.  No hx of cancer.     OV 05/13/2016  Chief Complaint  Patient presents with  . Follow-up    Pt here after CXR and seeing TP on 8.22.17. Pt states her SOB has not improved since also OV. PT c/o DOE, intermittent dry cough, chest tightness when SOB and resolves when she lays down. Pt states she had mild left foot swelling on 9.21.17, this has resolved. Pt denies Gina/c/s.     [name]89year old female with very limited remote smoking. In July 2017 she was admitted with left-sided DVT and was started on Xarelto. Sometime after that according to her history she started developing shortness of breath which has persisted to this day despite changes with Xarelto to es.E;liquis. This dyspnea is pervasive. It is moderate to severe in intensity. It is present for exertion and relieved by rest. Class II-III levels of activity makes her dyspneic. There is no  orthopnea paroxysmal nocturnal dyspnea. She had 3 CT scans of the chest in July 2017 that ruled out pulmonary embolism but in August 2017 she did have some new groundglass opacities. August 2017 VQ scan was of intermediate probability. Only function does show reduction in diffusion capacity suggesting the groundglass opacities might be real. She did have autoimmune workup in August 2017 along  with complement and vascular this workup that was normal. She presents today with her husband. She is really frustrated by her dyspnea. Chest x-ray personally visualized shows some nonspecific scarring. She is not able to do her household activities.  she has a dry cough of mild intensity for the last 6 months. Cough preceded onset of dyspnea and DVT. At last visit they specifically denied this cough. This no associated wheezing or orthopnea paroxysmal nocturnal dyspnea or chills.    Exhaled nitric oxide testing today in the office 05/13/2016: 23 ppb and normal   Walking desaturation test on 05/13/2016 185 feet x 3 laps:   did NOT desaturate. Rest pulse ox was 100%, final pulse ox was 100% was the final pulse ox w heart rate response was at rest78. HR response  to 94/min at peak exertion.    Noted SCL 70 not done (no dysphagia)  D dimer this visti 0.36 and n  ormal  Dg Chest 2 View  Result Date: 05/13/2016 CLINICAL DATA:  Follow-up pulmonary infiltrates. EXAM: CHEST  2 VIEW COMPARISON:  04/07/2016 and 03/31/2016 FINDINGS: Mild hyperinflation is stable. Stable linear densities in the anterior left lung. No significant airspace disease or consolidation. Heart and mediastinum are within normal limits and stable. Surgical plate in the lower cervical spine. Trachea is midline. No large pleural effusions. Old left seventh rib fracture. IMPRESSION: Stable linear densities in the left lung are probably related to scarring. No acute cardiopulmonary disease. Electronically Signed   By: Markus Daft M.D.   On: 05/13/2016 14:25       OV 06/01/2016  Chief Complaint  Patient presents with  . Follow-up    Pt here after CPST. Pt states her SOB has slightly improved since last OV. Pt c/o dry cough and occassional chest tightness - under breasts after eating dinner in evening. Pt denies chest congestion and Gina/c/s.     Dyspnea evaluation. Here for review of pulmonary stress test. She underwent pulmonary  stress test 05/26/2016. She gave an excellent effort. She had normal VO2 max and anaerobic threshold. Nevertheless there is a flattening of her O2 pulse curve that is significant. This is associated with tachypnea and dead space ventilation and approaching ventilatory limits. Pulse ox did not go down. In summary this is a combinati    She is reporting weight gain since a deep vein thrombosis. So far pulmonary embolism has never been confirmed. In fact the CT chest in October 2017 only shows groundglass opacities. Echocardiogram shows diastolic dysfunction. In addition after coming off her estrogen which she was taking for many years [in July 2017 after DVT] she is reporting significant hot flashes which are frustrating her. She continues to suffer from dyspnea and is frustrated by this as well.     OV 08/10/2016  Chief Complaint  Patient presents with  . Follow-up    Pt states overall her SOB has improved but has bad days every now and then. Pt c/o more SOB today and occassional dry cough with some  frank blood - size of penny. Pt also states she has chest tightness when she is SOB -  this resolves with rest.     Follow-up dyspnea: We finally determined that this dyspnea was unlikely due to pulmonary embolism. Was related to diastolic dysfunction and pulmonary groundglass infiltrates sustained in July 2017 at the time of deep vein thrombosis diagnosis. Last visit October 2017 I started Lasix therapy. Since then with the passage of time and Lasix therapy she has improved with her dyspnea. She still does have mild dyspnea on exertion. She has not attended pulmonary rehabilitation. She does not want to go to pulmonary habitation due to logistical issues and time issues. She and her husband said they will start exercising on a road bike for an exercise bike by themselves gradually.  In terms of deep vein thrombosis related to estrogen intake.. She continues to be on anticoagulation since July 2017.  She's completed 5 months of therapy. Interim d-dimer  normal. She has no bleeding complications. She wants to stop anticoagulation.  OV 10/25/2016  Chief Complaint  Patient presents with  . Follow-up    Pt here after HRCT. pt states her breathing is unchanged since last OV. Pt denies cough and CP/tightness.     Follow-up dyspnea: At last visit she told me that with Lasix therapy that her dyspnea improved. Today she tells me that she has now plateaued in terms of improvement. She still has dyspnea on exertion relieved by rest. The Lasix is helping but without the Lasix it is worse. It at the same time she feels she has not returned to her baseline pre-summer 2017. High-resolution CT scan of the chest done today and personally visualized shows the following abnormalities described suggestive of MAI infection  In terms of the left lower extremity DVT: In December 2017 we stopped anticoag based on normal D-dimer. She says since summer 2017 continues with daily left pain that sarts in ankle and spreads up to popliteal area along the venous course. On February 2018 on the 21st she had Doppler ultrasound of the left lower extremity that did not show any DVT. She also had chemistries that are normal. She takes Naprosyn once daily for this. She says all this is new since the DVT. She is frustrated by the pain. She says that primary care has deferred this issue to me also notices associated edema. She is wondering about vascular insufficiency and is requesting whether she should be referred to a vascular evaluation  Ct Chest High Resolution  Result Date: 10/25/2016 CLINICAL DATA:  Dyspnea and wheezing. Former smoker. History of pulmonary embolism and DVT. Follow-up abnormal chest CT. EXAM: CT CHEST WITHOUT CONTRAST TECHNIQUE: Multidetector CT imaging of the chest was performed following the standard protocol without intravenous contrast. High resolution imaging of the lungs, as well as inspiratory and expiratory  imaging, was performed. COMPARISON:  03/30/2016 chest CT angiogram. 07/08/2016 chest radiograph. FINDINGS: Cardiovascular: Normal heart size. No significant pericardial fluid/thickening. Left anterior descending coronary atherosclerosis. Atherosclerotic thoracic aorta with stable mild ectasia of the ascending thoracic aorta (maximum diameter 3.9 cm). Normal caliber pulmonary arteries. Mediastinum/Nodes: Status post left hemithyroidectomy. No discrete right thyroid lobe nodules. Unremarkable esophagus. No pathologically enlarged axillary, mediastinal or gross hilar lymph nodes, noting limited sensitivity for the detection of hilar adenopathy on this noncontrast study. Lungs/Pleura: No pneumothorax. No pleural effusion. There is mild-to-moderate cylindrical and varicoid bronchiectasis extensively distributed throughout both lungs, most prominent in the basilar right upper lobe, right middle lobe and lingula. There is extensive patchy tree-in-bud and centrilobular nodularity throughout both lungs at the areas of bronchiectasis, which appears overall  mildly worsened since 03/30/2016, including a new dominant nodular 13 x 11 mm opacity in the medial dependent right lower lobe (series 3/ image 76). Bandlike subpleural opacities at the areas of bronchiectasis in the basilar right upper lobe, medial segment right middle lobe and lingula are stable and compatible with postinfectious scars. Otherwise no acute consolidative airspace disease or lung masses. Mild centrilobular emphysema. Mild patchy air trapping in both lungs on the expiration sequence. No frank honeycombing. Upper abdomen: Partially visualized left parapelvic renal cysts. Musculoskeletal: No aggressive appearing focal osseous lesions. Partially visualized surgical hardware from ACDF in the lower cervical spine. Stable T9 vertebral hemangioma. Mild thoracic spondylosis. IMPRESSION: 1. 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 Findings have mildly worsened in the interval since 03/30/2016, including a new dominant 13 mm nodular opacity in the right lower lobe. Recommend initial follow-up post treatment high-resolution chest CT in 6 months. 2. Mild patchy air trapping in both lungs, suggesting small airways disease. 3. Aortic atherosclerosis.  One vessel coronary atherosclerosis. Electronically Signed   By: Ilona Sorrel M.D.   On: 10/25/2016 10:07    OV 12/20/2016  Chief Complaint  Patient presents with  . Follow-up    Pt here after bronch. Pt states her breathing is unchanged since last OV. Pt c/o dry cough and chest heaviness. Pt denies Gina/c/s and CP/tightness.    Follow-up pulmonary infiltrates not otherwise specified: She underwent bronchoscopy with bronchoalveolar lavage of July 2018 which showed 57% monocytes and 26% neutrophils. Cultures are negative except for colonizing contaminant mold ; low colony count. Last CT scan was March 2018  Follow-up dyspnea: She continues to have dyspnea. She continues to insist that is all after the DVT. There are no new issues. She is planning [place] exercise [place]    03/20/2017 acute extended ov/Wert re: sob at rest and dry cough x one year  Chief Complaint  Patient presents with  . Acute Visit    Increased SOB, no energy, wheezing and non prod cough x 6 wks. Her cough is non prod, and esp worse at night, sometimes wakes her up.  No appetite.    after last ov with MR 12/20/16 (no change rx) Breathing was better for two weeks only if sitting but doe  x 50 ft  And mailbox and stops 200 ft flat ever since dvt "PE"  Since then sob at rest unless sleeping p diazepam and "sleeping pill" and  tramadol not aware of noct symptoms but after wakes each day  Always stops due to wob just walking to kitchen  Cough worse/ very harsh dry day > noct  PC rec prozac higher dose but no better so back to 10 mg daily  No better on saba hfa/ neb   No obvious patterns in day to day or daytime variability or assoc excess/ purulent sputum or mucus plugs or hemoptysis or cp or chest tightness,  or overt sinus or hb symptoms. No unusual exp hx or h/o childhood pna/ asthma or knowledge of premature birth.    OV 03/28/2017  Chief Complaint  Patient presents with  . Follow-up    Pt saw MW on 7.30.2018 for an acute visit. Pt states her SOB has not improved since last OV. Pt states she still  has a dry cough. Pt denies CP/tightness, Gina/c/s, chest congestion.    Gina Lyons is had long-standing shortness of breath that remained somewhat unexplained. The onset was in summer 2017 at the time of admission for DVT. There is no clear-cut evidence of pulmonary embolism. She did have some groundglass opacities at that time. Initially diuresis helped but subsequently did not. Over time her CT scan of the chest to change to the extent that in spring 2018 dose addition she had MAI infection. I did a bronchoscopy in April 2018 and then saw her for follow-up in May 2018. At this point in time the cultures were negative but subsequently in June 2018 the cultures returned showing MAI infection. I had a phone conversation with her and we opted to follow because she was feeling better. Now she presents for follow-up since her visit in May 2018 she has had worsening shortness of breath. In the interim she even saw my colleague Dr. Legrand Como wert for worsening shortness of breath. Walking desaturation test 185 feet 3 laps on room air with a full head probe: d walk only one lap and got very short of breath. She was short of breath for the remaining 510 minutes of a conversation in the room. She did not desaturate below 100% and she did  not get tachycardic. She is also reporting severe associated significant fatigue. Dyspnea and symptoms are worse in the last few months. During household work is extremely tiring. She says the fatigue has been worked up extensively by her primary care physician without any obvious cause  Last d-dimer September 2017 was normal. normal. She's currently not on anticoagulation  Echocardiogram August 2017 with ejection fraction 55% with normal nuclear medicine cardiac stress test November 2017  Normal allergy tests July 2018  Chest x-ray 03/20/2017: Personally visualized. Chronic bronchitic changes. Radiology second running high resolution CT chest.  Results for SADHANA, FRATER (MRN [id number]) as of 03/28/2017 09:26  Ref. Range 11/28/2016 15:35  ACID FAST SMEAR (AFB) Unknown Rpt  ACID FAST CULTURE WITH REFLEXED SENSITIVITIES Unknown Rpt (A)  Acid Fast Culture Unknown Positive (A)  Organism ID Unknown Comment (A)  MAC SUSCEPTIBILITY BROTH   Unknown Rpt (A)  AFB ORGANISM ID BY DNA PROBE Unknown Rpt (A)   IMPRESSION: 1. 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 Findings have mildly worsened in the interval since 03/30/2016, including a new dominant 13 mm nodular opacity in the right lower lobe. Recommend initial follow-up post treatment high-resolution chest CT in 6 months. 2. Mild patchy air trapping in both lungs, suggesting small airways disease. 3. Aortic atherosclerosis.  One vessel coronary atherosclerosis.   Electronically Signed   By: Ilona Sorrel M.D.   On: 10/25/2016 10:07   OV 05/11/2017  Chief Complaint  Patient presents with  . Follow-up    Pt states that she still  has tingling and pain in her ankle from the blood clot. Still becomes SOB on exertion, but is doing better. Does have a lot of dry coughing. Denies any CP.   Follow-up dyspnea that is now deemed due to bronchiectasis associated with MAI infection. At last visit d-dimer was high we did a CT angiogram ruled out pulmonary embolism but did show bronchiectasis associated with MAI infection. I referred her to infectious diseases Dr.: She is  now on MAI treatment. This is causing significant nausea and fatigue. The trying different protocols and it seems to be helping. With the MAI treatment her dyspnea might be better but her cough is definitely not worse. In association she also has other nonspecific different cans of symptoms. She did complain that last night her left cough is a bit swollen but rate is better. This no warmth. She does not want any duplex ultrasound for this at this point. Review of the notes indicate that have not tried inhaler therapy for her in the past. Of note she is finished a flu shot for this season   OV 06/19/2017  Chief Complaint  Patient presents with  . Follow-up    Pt still complains of both legs hurting, if anything pain has gotten worse. Also has complaints of dry cough, SOB on exertion and occ. CP.    Follow-up dyspnea in the setting of MAI infection  She continues on MAI treatment. She is supposed to come back in the early part of 2019 for some reason she had an appointment scheduled today.She says that Spiriva that I gave last emesis sample helped her dyspnea but then it gave her the shakes.  Willing to try another inhaler for dyspnea from another class.her main issue today is that she continues to have chron lower extremity pain on the left side. This is chronic. She needs to talk to primary care physician about this   OV 08/03/2017  Chief Complaint  Patient presents with  . Follow-up    Pt stated that she has a lot of questions for MR and wanted an appt. Pt has  been about the same since last visit. Has been having a lot of side effects from some meds she has been put on.    75 year old female for dyspnea in the setting of MAI infection  I just saw her June 19, 2017.  This visit is an acute visit.  She made this visit specifically because of concerns of drug intolerance with her MAI treatment.  She is here with her husband.  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  She is on triple drug regimen of ethambutol rifampin and azithromycin.  This gives a significant nausea and fatigue.  They did see infectious diseases Dr. Linus Salmons on July 11, 2017.  Repeat sputum culture was done and this did not show any acid-fast bacilli.  Culture is still pending although no growth so far.  Conversation was held with the patient and patient agreed to start inhaled tobramycin.  She does have a history of tinnitus which was noted by infectious diseases and audiology appointment has been set up but patient is now concerned about all the significant side effects of inhaled tobramycin that include ototoxicity.  She is frustrated about adding a fourth medication to a pre-existing regimen.  She says she does not understand the rationale.  She is frustrated that all aspects of her treatment of benefits, risks and implications of not been communicated with her although the documentation is otherwise.  She is asking for blood work and chest x-ray and guidance from me about decision making with her treatment.  I did speak with infectious diseases Dr. Linus Salmons who is supportive of goal-directed therapy.  Patient is a whether the disease has potential to progress without Mycobacterium avium complex treatment.  On the other hand she has been explained to her that the fatigue and side effects of treatment can sometimes be overwhelming and sometimes patients  do opt for supportive care.  She had a husband wondering about a 2-week drug holiday of a 3 baseline  drugs.  At this point she is not inclined to start inhaled tobramycin at all.  There are no other issues.   OV 11/02/2017  Chief Complaint  Patient presents with  . Follow-up    Pt is still coughing in the mornings and does still become SOB with exertion and occ. chest pressure.    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  She subsequently saw Dr. Scharlene Gloss in January 2019 and has completely stopped her MAI treatment.  Sputum culture at that time was negative for growth [initial positive growth was April 2018].  She says her energy levels are much better and her fatigue is resolved after stopping MAI treatment.  Overall she feels better except early morning she has cough that lasts moderate amount of time and severity.  Once she starts a Spiriva she gets better.  She does have postnasal drip for which she only takes in [place] nasal spray.  She has a Flonase at home but she is not taking it.  She is a little bit frustrated with the cough but nevertheless is not overwhelming.  She prefers conservative line of treatment.   OV 12/15/2017  Chief Complaint  Patient presents with  . Follow-up    Pt has had increased fatigue and breathing has become worse and states it has been worse for several weeks. Pt's husband states all pt wants to do is sleep. Pt also has some mild coughing.    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  This is an acute visit.  According to the husband and the patient for the last 3 weeks has increased fatigue.  She is tired all the time even at rest.  When she exerts herself the fatigue is worse.  She stopped after walking from the house to the mailbox because of fatigue.  The fatigue is associated with shortness of breath but only the shortness of breath is present on  exertion and relieved by rest.  There is no associated chest pain orthopnea or proximal nocturnal dyspnea or worsening edema or hemoptysis or chest pain.  However there is some increased wheezing.  Also at night when she is sleeping she feels rattling in her chest.  There is no respiratory infection fever.  Just back in September 75 year old with pulmonary embolism.  The husband feels that his blood pressure is low because of the systolic being 096 but the mean arterial pressure is adequate.  She is currently not on MAI treatment due to intolerance.  Walking desaturation test 180 feet x 3 laps on room air with a forehead probe.  She started off at 100% and stopped when she finished one lap because of shortness of breath.  She only walk very slowly.  Pulse ox when she stopped was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

## 2021-04-01 ENCOUNTER — Other Ambulatory Visit: Payer: Self-pay | Admitting: Surgery

## 2021-04-01 DIAGNOSIS — I712 Thoracic aortic aneurysm, without rupture, unspecified: Secondary | ICD-10-CM

## 2021-05-19 ENCOUNTER — Ambulatory Visit
Admission: RE | Admit: 2021-05-19 | Discharge: 2021-05-19 | Disposition: A | Discharge status: Home / Self Care | Payer: Medicare Other | Source: Ambulatory Visit | Attending: Surgery | Admitting: Surgery

## 2021-05-19 ENCOUNTER — Other Ambulatory Visit: Payer: Self-pay

## 2021-05-19 ENCOUNTER — Encounter: Payer: Self-pay | Admitting: Surgery

## 2021-05-19 ENCOUNTER — Ambulatory Visit: Payer: Medicare Other | Admitting: Surgery

## 2021-05-19 VITALS — BP 132/78 | HR 74 | Resp 20 | Ht 62.0 in | Wt 124.0 lb

## 2021-05-19 DIAGNOSIS — I712 Thoracic aortic aneurysm, without rupture, unspecified: Secondary | ICD-10-CM

## 2021-05-19 DIAGNOSIS — R918 Other nonspecific abnormal finding of lung field: Secondary | ICD-10-CM

## 2021-05-19 DIAGNOSIS — I7121 Aneurysm of the ascending aorta, without rupture: Secondary | ICD-10-CM

## 2021-05-19 NOTE — Progress Notes (Signed)
HPI:  The patient is a 75 year old woman who returns for follow-up of a stable 4 cm ascending aortic aneurysm.  I last saw her on 10/28/2020 and CT scan at that time showed findings compatible with chronic infectious bronchiolitis and possible MAI.  She has been followed by infectious disease at [place] and was started on treatment for MAI but said that she had to discontinue that because it was making her feel terrible.  She is also followed by Dr. Chase Caller for her pulmonary disease.  Current Outpatient Medications  Medication Sig Dispense Refill   amLODipine (NORVASC) 5 MG tablet Take 5 mg by mouth at bedtime.     Biotin 5 MG CAPS Take 5 mg by mouth daily.     levothyroxine (SYNTHROID, LEVOTHROID) 112 MCG tablet Take 112 mcg by mouth daily before breakfast. Brand name only**     rOPINIRole (REQUIP) 0.25 MG tablet Take by mouth.     sodium chloride (OCEAN) 0.65 % SOLN nasal spray Place 1 spray into both nostrils at bedtime as needed for congestion.     Tiotropium Bromide-Olodaterol (STIOLTO RESPIMAT) 2.5-2.5 MCG/ACT AERS Inhale 2 puffs into the lungs daily. 4 g 4   traZODone (DESYREL) 50 MG tablet Take by mouth.     traZODone (DESYREL) 50 MG tablet Take 50 mg by mouth at bedtime.     diazepam (VALIUM) 10 MG tablet Take 10 mg by mouth every 6 (six) hours as needed for anxiety. (Patient not taking: Reported on 05/19/2021)     No current facility-administered medications for this visit.     Physical Exam: BP 132/78   Pulse 74   Resp 20   Ht 5\' 2"  (1.575 m)   Wt 124 lb (56.2 kg)   SpO2 93% Comment: RA  BMI 22.68 kg/m  She looks well. Cardiac exam shows a regular rate and rhythm with normal heart sounds.  There is no murmur. Lungs are clear.  Diagnostic Tests:  Narrative & Impression  CLINICAL DATA:  Follow-up TAA, lung nodules   EXAM: CT CHEST WITHOUT CONTRAST   TECHNIQUE: Multidetector CT imaging of the chest was performed following the standard protocol without IV  contrast.   COMPARISON:  10/28/2020   FINDINGS: Cardiovascular: Aortic atherosclerosis. Unchanged enlargement of the tubular ascending thoracic aorta, measuring up to 4.0 x 4.0 cm. Normal heart size. Left coronary artery calcifications. No pericardial effusion.   Mediastinum/Nodes: No enlarged mediastinal, hilar, or axillary lymph nodes. Thyroid gland, trachea, and esophagus demonstrate no significant findings.   Lungs/Pleura: Mild centrilobular emphysema. Diffuse bilateral bronchial wall thickening. Bandlike scarring and volume loss of the medial right upper lobe and lingula. There are numerous clustered centrilobular and tree-in-bud nodules throughout the lungs, with some areas of worsened nodularity, for example in the peripheral right lung base (series 8, image 109). No pleural effusion or pneumothorax.   Upper Abdomen: No acute abnormality.   Musculoskeletal: No chest wall mass or suspicious bone lesions identified. Benign, trabeculated vertebral body hemangioma of T9 (series 6, image 88).   IMPRESSION: 1. Unchanged enlargement of the tubular ascending thoracic aorta, measuring up to 4.0 x 4.0 cm. Recommend annual imaging followup by CTA or MRA. This recommendation follows 2010 ACCF/AHA/AATS/ACR/ASA/SCA/SCAI/SIR/STS/SVM Guidelines for the Diagnosis and Management of Patients with Thoracic Aortic Disease. Circulation. 2010; 121: N562-Z308. Aortic aneurysm NOS (ICD10-I71.9) 2. There are numerous clustered centrilobular and tree-in-bud nodules throughout the lungs, with some areas of worsened nodularity. Findings are consistent with worsened atypical infection, particularly atypical Mycobacterium. 3.  Diffuse bilateral bronchial wall thickening, consistent with nonspecific infectious or inflammatory bronchitis. 4. Emphysema. 5. Coronary artery disease.   Aortic Atherosclerosis (ICD10-I70.0) and Emphysema (ICD10-J43.9).     Electronically Signed   By: Eddie Candle  M.D.   On: 05/19/2021 10:41      Impression: Her ascending aortic aneurysm is stable at 4 cm.  This is well below the surgical threshold of 5.5 cm.  There are numerous clustered nodules throughout the lungs with some areas of worsening nodularity felt to be consistent with MAI.  I reviewed the CT images with her and answered all of her questions.  Her aortic aneurysm is small and stable and I think it only needs to be followed every 2 years with CT.  I stressed the importance of continued good blood pressure control in preventing further enlargement and acute aortic dissection.  She will continue to follow-up with infectious disease at [facility] for her MAI pulmonary disease.  Plan:  I will see her back in 2 years with a CT scan of the chest without contrast to follow-up on her ascending aortic aneurysm.  I spent 20 minutes performing this consultation and > 50% of this time was spent face to face counseling and coordinating the care of this patient's ascending aortic aneurysm.   Gaye Pollack, MD [facility] [phone]

## 2022-09-20 ENCOUNTER — Other Ambulatory Visit: Payer: Self-pay | Admitting: Surgery

## 2022-09-20 DIAGNOSIS — I7121 Aneurysm of the ascending aorta, without rupture: Secondary | ICD-10-CM

## 2022-11-02 ENCOUNTER — Ambulatory Visit: Payer: Medicare Other | Admitting: Surgery

## 2022-11-02 ENCOUNTER — Ambulatory Visit
Admission: RE | Admit: 2022-11-02 | Discharge: 2022-11-02 | Disposition: A | Discharge status: Home / Self Care | Payer: Medicare Other | Source: Ambulatory Visit | Attending: Surgery | Admitting: Surgery

## 2022-11-02 ENCOUNTER — Encounter: Payer: Self-pay | Admitting: Surgery

## 2022-11-02 VITALS — BP 150/83 | HR 75 | Resp 20 | Ht 62.0 in | Wt 129.0 lb

## 2022-11-02 DIAGNOSIS — I7121 Aneurysm of the ascending aorta, without rupture: Secondary | ICD-10-CM

## 2022-11-02 MED ORDER — IOPAMIDOL (ISOVUE-370) INJECTION 76%
75.0000 mL | Freq: Once | INTRAVENOUS | Status: AC | PRN
  Administered 2022-11-02: 75 mL via INTRAVENOUS

## 2022-11-02 NOTE — Progress Notes (Signed)
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  She also had small bilateral pulmonary nodules that have been unchanged.  She said that she feels like her breathing may be getting a little worse.  She has been followed by Dr. Chase Caller but has not seen him since May 2022.  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  Current Outpatient Medications  Medication Sig Dispense Refill   amLODipine (NORVASC) 5 MG tablet Take 5 mg by mouth at bedtime.     Biotin 5 MG CAPS Take 5 mg by mouth daily.     diazepam (VALIUM) 10 MG tablet Take 10 mg by mouth every 6 (six) hours as needed for anxiety.     levothyroxine (SYNTHROID, LEVOTHROID) 112 MCG tablet Take 112 mcg by mouth daily before breakfast. Brand name only**     sodium chloride (OCEAN) 0.65 % SOLN nasal spray Place 1 spray into both nostrils at bedtime as needed for congestion.     Tiotropium Bromide-Olodaterol (STIOLTO RESPIMAT) 2.5-2.5 MCG/ACT AERS Inhale 2 puffs into the lungs daily. 4 g 4   traZODone (DESYREL) 50 MG tablet Take 50 mg by mouth at bedtime.     traZODone (DESYREL) 50 MG tablet Take by mouth.     No current facility-administered medications for this visit.     Physical Exam: BP (!) 150/83 (BP Location: Right Arm, Patient Position: Sitting)   Pulse 75   Resp 20   Ht 5\' 2"  (1.575 m)   Wt 129 lb (58.5 kg)   SpO2 90% Comment: RA  BMI 23.59 kg/m  She looks well. Cardiac exam shows regular rate and rhythm with normal heart sounds.  There is no murmur. Lungs are clear.  Diagnostic Tests:  ADDENDUM REPORT: 11/02/2022 19:39   ADDENDUM: Note that the nodule associated with the calcification in the RIGHT lower lobe has lower density that raises the possibility of fat density in this area which  would make this more suggestive of hamartoma. The margins are slightly irregular which is atypical of hamartoma and there is a change from prior imaging. When follow-up is performed would suggest noncontrast imaging to assess for areas of fat within the lesion.   These results will be called to the ordering clinician or representative by the Radiologist Assistant, and communication documented in the PACS or [employer].     Electronically Signed   By: Zetta Bills M.D.   On: 11/02/2022 19:39    Addended by Felipa Emory, MD on 11/02/2022  7:42 PM    Study Result  Narrative & Impression  CLINICAL DATA:  77 year old female presents for evaluation of an aortic aneurysm   EXAM: CT ANGIOGRAPHY CHEST WITH CONTRAST   TECHNIQUE: Multidetector CT imaging of the chest was performed using the standard protocol during bolus administration of intravenous contrast. Multiplanar CT image reconstructions and MIPs were obtained to evaluate the vascular anatomy.   RADIATION DOSE REDUCTION: This exam was performed according to the departmental dose-optimization program which includes automated exposure control, adjustment of the mA and/or kV according to patient size and/or use of iterative reconstruction technique.   CONTRAST:  28mL ISOVUE-370 IOPAMIDOL (ISOVUE-370) INJECTION 76%   COMPARISON:  May 19, 2021   FINDINGS: Cardiovascular: Aortic atherosclerosis both calcified and noncalcified  is mild-to-moderate.   Ascending thoracic aorta 4.0 x 4.0 cm transverse dimension.   Descending thoracic aorta at 2.5 cm. Standard three-vessel branching pattern in the chest.   Heart size is normal. Minimal calcified coronary artery disease. Central pulmonary vasculature is of normal caliber.   Mediastinum/Nodes: No thoracic inlet, axillary, mediastinal or hilar adenopathy. Esophagus grossly normal.   Lungs/Pleura: Pleural and parenchymal scarring throughout the RIGHT chest  predominantly in upper and middle lobe, also with some scarring and bronchiectasis in the lingula.   Mildly irregular RIGHT lower lobe pulmonary nodule 0.9 x 0.9 cm measuring 0.7 x 0.6 cm in September of 2018. There is some calcification at the periphery and central calcification that was exhibited on previous imaging imaging features on prior studies that would be compatible with granuloma. Calcification not as conspicuous on the current exam.   Scattered small tree-in-bud opacities and small nodules in the upper lobe at the periphery on image 59/6 displayed no substantial change compared to September of 2022. Small tree-in-bud nodules in the LEFT upper lobe (image 35/6) largest at 4 mm also without change. No consolidation or sign of pleural effusion.   Upper Abdomen: Unremarkable to the extent evaluated.   Musculoskeletal: Spinal degenerative changes without acute or destructive bone finding.   Review of the MIP images confirms the above findings.   IMPRESSION: 1. Ascending thoracic aortic dilation 4 x 4 cm. Recommend annual imaging followup by CTA or MRA. This recommendation follows 2010 ACCF/AHA/AATS/ACR/ASA/SCA/SCAI/SIR/STS/SVM Guidelines for the Diagnosis and Management of Patients with Thoracic Aortic Disease. Circulation. 2010; [id number]:[id number]. Aortic aneurysm NOS (ICD10-I71.9) 2. Dominant RIGHT lower lobe nodule has enlarged but showed clear evidence of calcification compatible with granuloma on the prior study. There is some calcification that appears more peripheral currently. The nodule has also enlarged in the interval. Would favor superimposed inflammatory changes increasing conspicuity but given enlargement would suggest a 3 month follow-up and correlation with any worsening respiratory symptoms to exclude further enlargement. 3. Other changes of chronic infection perhaps related to MAI. 4. Mild pulmonary emphysema. 5. Aortic atherosclerosis.   Aortic  Atherosclerosis (ICD10-I70.0) and Emphysema (ICD10-J43.9).   Electronically Signed: By: Zetta Bills M.D. On: 11/02/2022 10:37       Impression:  She has a stable 4 cm fusiform ascending aortic aneurysm that is well below the surgical threshold.  The dominant right lower lobe pulmonary nodule is slightly enlarged compared to her CT scan 2 years ago but there is calcification within this nodule that is most consistent with a granuloma.  The previously seen changes of probable MAI are still present.  I reviewed the CT images with her and her husband and answered their questions.  She is doing well clinically but has noted some worsening shortness of breath and is going to follow-up with pulmonary medicine.  Plan:  I will see her back in 2 years with a CTA of the chest for aortic surveillance.  I spent 15 minutes performing this established patient evaluation and > 50% of this time was spent face to face counseling and coordinating the care of this patient's aortic aneurysm.    Gaye Pollack, MD [facility] [phone]

## 2023-04-04 ENCOUNTER — Other Ambulatory Visit: Payer: Self-pay | Admitting: Surgery

## 2023-04-04 DIAGNOSIS — I7121 Aneurysm of the ascending aorta, without rupture: Secondary | ICD-10-CM

## 2023-04-26 ENCOUNTER — Ambulatory Visit: Payer: Medicare Other | Admitting: Surgery

## 2023-04-26 ENCOUNTER — Ambulatory Visit
Admission: RE | Admit: 2023-04-26 | Discharge: 2023-04-26 | Disposition: A | Discharge status: Home / Self Care | Payer: Medicare Other | Source: Ambulatory Visit | Attending: Surgery | Admitting: Surgery

## 2023-04-26 ENCOUNTER — Encounter: Payer: Self-pay | Admitting: Surgery

## 2023-04-26 VITALS — BP 116/72 | HR 70 | Resp 18 | Ht 62.0 in | Wt 129.0 lb

## 2023-04-26 DIAGNOSIS — I7121 Aneurysm of the ascending aorta, without rupture: Secondary | ICD-10-CM | POA: Diagnosis not present

## 2023-04-26 DIAGNOSIS — R918 Other nonspecific abnormal finding of lung field: Secondary | ICD-10-CM | POA: Diagnosis not present

## 2023-04-26 NOTE — Progress Notes (Signed)
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 She also had small bilateral pulmonary nodules that have been unchanged. She has been followed by Dr. Marchelle Gearing but has not seen him since May 2022. 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  I last saw her on 11/02/2022 and CTA of the chest showed that the dominant right lower lobe pulmonary nodule has slightly enlarged compared to her previous CT 2 years before that.  There was calcification in the nodule so was felt to be most consistent with a benign granuloma.  She returns today for follow-up of his lung nodule.  She is here today with her husband.  She reports that she recently suffered a stroke with some expressive aphasia which is improving.  She had no motor or sensory deficit.  Current Outpatient Medications  Medication Sig Dispense Refill   amLODipine (NORVASC) 5 MG tablet Take 5 mg by mouth at bedtime.     Biotin 5 MG CAPS Take 5 mg by mouth daily.     diazepam (VALIUM) 10 MG tablet Take 10 mg by mouth every 6 (six) hours as needed for anxiety.     levothyroxine (SYNTHROID, LEVOTHROID) 112 MCG tablet Take 112 mcg by mouth daily before breakfast. Brand name only**     sodium chloride (OCEAN) 0.65 % SOLN nasal spray Place 1 spray into both nostrils at bedtime as needed for congestion.     Tiotropium Bromide-Olodaterol (STIOLTO RESPIMAT) 2.5-2.5 MCG/ACT AERS Inhale 2 puffs into the lungs daily. 4 g 4   traZODone (DESYREL) 50 MG tablet Take 50 mg by mouth at bedtime.     traZODone (DESYREL) 50 MG tablet Take by mouth.     No current facility-administered medications for this visit.     Physical Exam: BP 116/72 (BP Location: Left Arm, Patient Position: Sitting)   Pulse 70   Resp 18   Ht 5\' 2"  (1.575 m)   Wt 129  lb (58.5 kg)   SpO2 94% Comment: RA  BMI 23.59 kg/m  She looks well. Cardiac exam shows a regular rate and rhythm with normal heart sounds.  There is no murmur. Lungs are clear.   Diagnostic Tests:  Narrative & Impression CLINICAL DATA:  Ascending thoracic aortic aneurysm   EXAM: CT CHEST WITHOUT CONTRAST   TECHNIQUE: Multidetector CT imaging of the chest was performed following the standard protocol without IV contrast.   RADIATION DOSE REDUCTION: This exam was performed according to the departmental dose-optimization program which includes automated exposure control, adjustment of the mA and/or kV according to patient size and/or use of iterative reconstruction technique.   COMPARISON:  Multiple priors, most recent CTA dated November 02, 2022   FINDINGS: Cardiovascular: Normal heart size. No pericardial effusion. Mildly dilated ascending thoracic aorta measuring 4.0 x 4.0 cm. Moderate calcified plaque of the thoracic aorta. Mild coronary artery calcifications.   Mediastinum/Nodes: Esophagus is unremarkable. No enlarged lymph nodes seen in the chest.   Lungs/Pleura: Central airways are patent. Mild centrilobular emphysema. Mild scattered areas of focal bronchiectasis and numerous bilateral pulmonary nodules, not significantly changed when compared with the prior. Reference solid nodule of the right middle lobe measuring 5 mm on series 5, image 91, unchanged. Calcified right lower lobe nodule is decreased in size measuring 5 mm,  previously 12 x 9 mm. No pleural effusion.   Upper Abdomen: Left renal sinus cysts.  No acute abnormality.   Musculoskeletal: No aggressive appearing osseous lesions.   IMPRESSION: 1. Stable mildly dilated ascending thoracic aorta measuring 4.0 cm. Recommend annual imaging followup by CTA or MRA. This recommendation follows 2010 ACCF/AHA/AATS/ACR/ASA/SCA/SCAI/SIR/STS/SVM Guidelines for the Diagnosis and Management of Patients with Thoracic  Aortic Disease. Circulation. 2010; 121: [id number]. Aortic aneurysm NOS (ICD10-I71.9) 2. Decreased size of calcified right lower lobe nodule, consistent with a granuloma. 3. Scattered areas of focal bronchiectasis and numerous bilateral small solid pulmonary nodules, not significantly changed when compared with the prior exam, likely due to chronic atypical infection such as non tuberculous mycobacterial. 4. Aortic Atherosclerosis (ICD10-I70.0) and Emphysema (ICD10-J43.9).     Electronically Signed   By: Allegra Lai M.D.   On: 04/26/2023 13:03    Impression:  Her ascending aorta continues to measure about 4 cm and is only mildly enlarged.  It has not changed and is still well below the surgical threshold of 5.5 cm.  Given her age and comorbidities I do not think there is a need to continue doing CT scans to follow this.  The dominant right lower lobe pulmonary nodule has decreased in size and is calcified, consistent with a granuloma.  This does not require further follow-up.  The scattered areas of focal bronchiectasis and numerous bilateral small pulmonary nodules are unchanged and likely due to chronic atypical infection such as from MAI.  I reviewed the CT images with the patient and her husband.  I stressed the importance of continued good blood pressure control in preventing further enlargement of her aorta and acute aortic dissection.  She and her husband are in agreement with not continuing to follow her aorta with CT scans.  I do not think she requires further CT scans for follow-up of the benign-appearing pulmonary nodules.  Plan:  She will continue to follow-up with her PCP.  I will be happy to see her back at the need arises.  I spent 15 minutes performing this established patient evaluation and > 50% of this time was spent face to face counseling and coordinating the care of this patient's aortic aneurysm and pulmonary nodules.  Alleen Borne, MD [facility] [phone]
